# Patient Record
Sex: Female | Born: 1945
Health system: Southern US, Community
[De-identification: ages and names within clinical notes are randomized; demographics above are authoritative.]

## PROBLEM LIST (undated history)

## (undated) DIAGNOSIS — R609 Edema, unspecified: Secondary | ICD-10-CM

## (undated) DIAGNOSIS — Z972 Presence of dental prosthetic device (complete) (partial): Secondary | ICD-10-CM

## (undated) DIAGNOSIS — E119 Type 2 diabetes mellitus without complications: Secondary | ICD-10-CM

## (undated) DIAGNOSIS — C50919 Malignant neoplasm of unspecified site of unspecified female breast: Secondary | ICD-10-CM

## (undated) DIAGNOSIS — K219 Gastro-esophageal reflux disease without esophagitis: Secondary | ICD-10-CM

## (undated) DIAGNOSIS — D649 Anemia, unspecified: Secondary | ICD-10-CM

## (undated) DIAGNOSIS — I10 Essential (primary) hypertension: Secondary | ICD-10-CM

## (undated) HISTORY — PX: COLONOSCOPY: SHX174

## (undated) HISTORY — PX: EYE SURGERY: SHX253

## (undated) HISTORY — DX: Malignant neoplasm of unspecified site of unspecified female breast: C50.919

## (undated) HISTORY — PX: BREAST SURGERY: SHX581

## (undated) HISTORY — PX: OOPHORECTOMY: SHX86

---

## 1978-11-10 HISTORY — PX: ABDOMINAL HYSTERECTOMY: SHX81

## 2012-02-14 ENCOUNTER — Other Ambulatory Visit: Payer: Self-pay | Admitting: Internal Medicine

## 2012-02-14 LAB — CBC WITH DIFFERENTIAL/PLATELET
Basophil %: 4 %
Eosinophil #: 0.3 10*3/uL (ref 0.0–0.7)
Eosinophil %: 5.6 %
HCT: 41.5 % (ref 35.0–47.0)
HGB: 13.5 g/dL (ref 12.0–16.0)
Lymphocyte #: 1.2 10*3/uL (ref 1.0–3.6)
Lymphocyte %: 19.9 %
MCV: 92 fL (ref 80–100)
Monocyte %: 5.4 %
Neutrophil #: 3.8 10*3/uL (ref 1.4–6.5)
Neutrophil %: 65.1 %
Platelet: 354 10*3/uL (ref 150–440)
RDW: 13 % (ref 11.5–14.5)
WBC: 5.8 10*3/uL (ref 3.6–11.0)

## 2012-02-14 LAB — COMPREHENSIVE METABOLIC PANEL
Albumin: 4 g/dL (ref 3.4–5.0)
Alkaline Phosphatase: 84 U/L (ref 50–136)
Anion Gap: 9 (ref 7–16)
Bilirubin,Total: 0.5 mg/dL (ref 0.2–1.0)
Calcium, Total: 9.3 mg/dL (ref 8.5–10.1)
Chloride: 104 mmol/L (ref 98–107)
Co2: 26 mmol/L (ref 21–32)
EGFR (African American): >60
EGFR (Non-African Amer.): 58 — ABNORMAL LOW
Glucose: 112 mg/dL — ABNORMAL HIGH (ref 65–99)
Osmolality: 277 (ref 275–301)
Potassium: 3.9 mmol/L (ref 3.5–5.1)
SGPT (ALT): 18 U/L
Total Protein: 8.7 g/dL — ABNORMAL HIGH (ref 6.4–8.2)

## 2012-02-14 LAB — LIPID PANEL
HDL Cholesterol: 87 mg/dL — ABNORMAL HIGH (ref 40–60)
Ldl Cholesterol, Calc: 118 mg/dL — ABNORMAL HIGH (ref 0–100)
Triglycerides: 64 mg/dL (ref 0–200)
VLDL Cholesterol, Calc: 13 mg/dL (ref 5–40)

## 2012-02-14 LAB — TSH: Thyroid Stimulating Horm: 0.879 u[IU]/mL

## 2012-02-16 ENCOUNTER — Ambulatory Visit: Payer: Self-pay | Admitting: Internal Medicine

## 2012-02-22 ENCOUNTER — Other Ambulatory Visit: Payer: Self-pay | Admitting: Internal Medicine

## 2012-02-22 LAB — COMPREHENSIVE METABOLIC PANEL
Albumin: 3.8 g/dL (ref 3.4–5.0)
Alkaline Phosphatase: 80 U/L (ref 50–136)
Anion Gap: 9 (ref 7–16)
Chloride: 105 mmol/L (ref 98–107)
Creatinine: 1.11 mg/dL (ref 0.60–1.30)
EGFR (African American): 60 — ABNORMAL LOW
EGFR (Non-African Amer.): 51 — ABNORMAL LOW
Glucose: 103 mg/dL — ABNORMAL HIGH (ref 65–99)
Potassium: 4 mmol/L (ref 3.5–5.1)
SGOT(AST): 23 U/L (ref 15–37)
SGPT (ALT): 15 U/L
Sodium: 141 mmol/L (ref 136–145)
Total Protein: 8.3 g/dL — ABNORMAL HIGH (ref 6.4–8.2)

## 2012-02-23 ENCOUNTER — Ambulatory Visit: Payer: Self-pay | Admitting: Internal Medicine

## 2012-02-23 LAB — PROTEIN ELECTROPHORESIS(ARMC)

## 2012-02-24 LAB — UR PROT ELECTROPHORESIS, URINE RANDOM

## 2012-03-01 ENCOUNTER — Ambulatory Visit: Payer: Self-pay | Admitting: Unknown Physician Specialty

## 2012-05-24 DIAGNOSIS — F419 Anxiety disorder, unspecified: Secondary | ICD-10-CM | POA: Insufficient documentation

## 2012-06-09 ENCOUNTER — Ambulatory Visit: Payer: Self-pay | Admitting: Internal Medicine

## 2012-06-30 ENCOUNTER — Ambulatory Visit: Payer: Self-pay | Admitting: Internal Medicine

## 2012-08-02 ENCOUNTER — Ambulatory Visit: Payer: Self-pay | Admitting: Gastroenterology

## 2012-08-02 HISTORY — PX: COLONOSCOPY: SHX174

## 2012-08-02 HISTORY — PX: ESOPHAGOGASTRODUODENOSCOPY: SHX1529

## 2012-08-02 LAB — HM COLONOSCOPY

## 2012-09-14 ENCOUNTER — Ambulatory Visit: Payer: Self-pay | Admitting: Gastroenterology

## 2012-10-26 LAB — HM COLONOSCOPY

## 2013-12-22 ENCOUNTER — Ambulatory Visit: Payer: Self-pay | Admitting: Internal Medicine

## 2014-12-27 DIAGNOSIS — K219 Gastro-esophageal reflux disease without esophagitis: Secondary | ICD-10-CM | POA: Diagnosis not present

## 2014-12-27 DIAGNOSIS — Z23 Encounter for immunization: Secondary | ICD-10-CM | POA: Diagnosis not present

## 2014-12-27 DIAGNOSIS — E1129 Type 2 diabetes mellitus with other diabetic kidney complication: Secondary | ICD-10-CM | POA: Diagnosis not present

## 2014-12-27 DIAGNOSIS — Z0000 Encounter for general adult medical examination without abnormal findings: Secondary | ICD-10-CM | POA: Diagnosis not present

## 2014-12-27 DIAGNOSIS — I10 Essential (primary) hypertension: Secondary | ICD-10-CM | POA: Diagnosis not present

## 2014-12-27 DIAGNOSIS — E782 Mixed hyperlipidemia: Secondary | ICD-10-CM | POA: Diagnosis not present

## 2015-02-22 ENCOUNTER — Ambulatory Visit
Admit: 2015-02-22 | Disposition: A | Discharge status: Home / Self Care | Payer: Self-pay | Attending: Internal Medicine | Admitting: Internal Medicine

## 2015-02-22 DIAGNOSIS — R928 Other abnormal and inconclusive findings on diagnostic imaging of breast: Secondary | ICD-10-CM | POA: Diagnosis not present

## 2015-02-22 DIAGNOSIS — Z1231 Encounter for screening mammogram for malignant neoplasm of breast: Secondary | ICD-10-CM | POA: Diagnosis not present

## 2015-03-01 ENCOUNTER — Ambulatory Visit
Admit: 2015-03-01 | Disposition: A | Discharge status: Home / Self Care | Payer: Self-pay | Attending: Internal Medicine | Admitting: Internal Medicine

## 2015-03-01 DIAGNOSIS — N63 Unspecified lump in breast: Secondary | ICD-10-CM | POA: Diagnosis not present

## 2015-03-01 DIAGNOSIS — R922 Inconclusive mammogram: Secondary | ICD-10-CM | POA: Diagnosis not present

## 2015-07-23 ENCOUNTER — Encounter: Payer: Self-pay | Admitting: Internal Medicine

## 2015-07-24 ENCOUNTER — Other Ambulatory Visit: Payer: Self-pay | Admitting: Internal Medicine

## 2015-07-24 DIAGNOSIS — E1122 Type 2 diabetes mellitus with diabetic chronic kidney disease: Secondary | ICD-10-CM | POA: Insufficient documentation

## 2015-07-24 DIAGNOSIS — E1169 Type 2 diabetes mellitus with other specified complication: Secondary | ICD-10-CM | POA: Insufficient documentation

## 2015-07-24 DIAGNOSIS — E785 Hyperlipidemia, unspecified: Secondary | ICD-10-CM

## 2015-07-24 DIAGNOSIS — N6019 Diffuse cystic mastopathy of unspecified breast: Secondary | ICD-10-CM

## 2015-07-24 DIAGNOSIS — I10 Essential (primary) hypertension: Secondary | ICD-10-CM | POA: Insufficient documentation

## 2015-07-24 DIAGNOSIS — N183 Chronic kidney disease, stage 3 (moderate): Secondary | ICD-10-CM

## 2015-07-24 DIAGNOSIS — K219 Gastro-esophageal reflux disease without esophagitis: Secondary | ICD-10-CM | POA: Insufficient documentation

## 2015-07-24 DIAGNOSIS — R7303 Prediabetes: Secondary | ICD-10-CM | POA: Insufficient documentation

## 2015-07-24 DIAGNOSIS — R634 Abnormal weight loss: Secondary | ICD-10-CM

## 2015-07-24 DIAGNOSIS — E782 Mixed hyperlipidemia: Secondary | ICD-10-CM | POA: Insufficient documentation

## 2015-07-24 HISTORY — DX: Abnormal weight loss: R63.4

## 2015-07-25 ENCOUNTER — Encounter: Payer: Self-pay | Admitting: Internal Medicine

## 2015-07-25 ENCOUNTER — Ambulatory Visit (INDEPENDENT_AMBULATORY_CARE_PROVIDER_SITE_OTHER): Payer: Medicare Other | Admitting: Internal Medicine

## 2015-07-25 VITALS — BP 138/80 | HR 76 | Ht 68.0 in | Wt 141.0 lb

## 2015-07-25 DIAGNOSIS — E119 Type 2 diabetes mellitus without complications: Secondary | ICD-10-CM

## 2015-07-25 DIAGNOSIS — N6012 Diffuse cystic mastopathy of left breast: Secondary | ICD-10-CM | POA: Diagnosis not present

## 2015-07-25 DIAGNOSIS — I10 Essential (primary) hypertension: Secondary | ICD-10-CM | POA: Diagnosis not present

## 2015-07-25 DIAGNOSIS — H6982 Other specified disorders of Eustachian tube, left ear: Secondary | ICD-10-CM

## 2015-07-25 MED ORDER — FLUTICASONE PROPIONATE 50 MCG/ACT NA SUSP: 2.0000 | Freq: Every day | NASAL | Status: DC

## 2015-07-25 NOTE — Progress Notes (Signed)
Date:  07/25/2015   Name:  Brandy Diaz   DOB:  August 05, 1946   MRN:  [id number]   Chief Complaint: Tinnitus Ear Fullness  There is pain in the left ear. This is a new problem. The current episode started 1 to 4 weeks ago. The problem occurs constantly. There has been no fever. Pertinent negatives include no coughing, ear discharge, headaches, hearing loss, neck pain or sore throat. Associated symptoms comments: Tinnitus - worse at night.  Diabetes She presents for her follow-up diabetic visit. She has type 2 diabetes mellitus. Her disease course has been stable. There are no hypoglycemic associated symptoms. Pertinent negatives for hypoglycemia include no dizziness or headaches. Pertinent negatives for diabetes include no chest pain and no fatigue. Symptoms are stable.  Hypertension This is a chronic problem. The current episode started more than 1 year ago. The problem is unchanged. The problem is controlled. Pertinent negatives include no chest pain, headaches, neck pain, palpitations or shortness of breath. Past treatments include beta blockers. There are no compliance problems.   No dizziness or headache.  Abnormal mammogram Patient had abnormal mammogram in April. Was a small density noted on the left. They wanted to refer her for biopsy but she declined. She is wondering if she should follow-up next month for repeat mammogram and ultrasound. She denies any breast mass or tenderness. Review of Systems:  Review of Systems  Constitutional: Negative for fever and fatigue.  HENT: Positive for facial swelling, sinus pressure and tinnitus. Negative for dental problem, ear discharge, ear pain, hearing loss, postnasal drip and sore throat.   Respiratory: Negative for cough, shortness of breath and stridor.   Cardiovascular: Negative for chest pain, palpitations and leg swelling.  Genitourinary: Negative.        No breast masses or tenderness and no nipple discharge  Musculoskeletal: Negative for  neck pain.  Neurological: Negative for dizziness, light-headedness, numbness and headaches.  Hematological: Negative for adenopathy.    Patient Active Problem List   Diagnosis Date Noted  . Essential (primary) hypertension 07/24/2015  . Gastro-esophageal reflux disease without esophagitis 07/24/2015  . Combined fat and carbohydrate induced hyperlipemia 07/24/2015  . Diabetes mellitus, type 2 07/24/2015  . Unexplained weight loss 07/24/2015  . Fibrocystic breast changes 07/24/2015    Prior to Admission medications   Medication Sig Start Date End Date Taking? Authorizing Provider  aspirin EC 81 MG tablet Take by mouth.   Yes Historical Provider, MD  atorvastatin (LIPITOR) 10 MG tablet Take 1 tablet by mouth daily. 12/27/14  Yes Historical Provider, MD  Glucose Blood (BAYER BREEZE 2 TEST) DISK BAYER BREEZE 2 TEST (In Vitro Disk)  1 (one) Each Each daily for 50 days  Quantity: 5;  Refills: 5   Ordered :27-Dec-2014  Alison Stalling ;  Started 26-Jun-2014 Active Comments: 250.02 06/26/14  Yes Historical Provider, MD  metoprolol succinate (TOPROL-XL) 25 MG 24 hr tablet Take 1 tablet by mouth 2 (two) times daily. 12/27/14  Yes Historical Provider, MD  omeprazole (PRILOSEC) 20 MG capsule Take 1 capsule by mouth daily. 12/27/14  Yes Historical Provider, MD    Allergies  Allergen Reactions  . Ace Inhibitors     Other reaction(s): Cough  . Hydrochlorothiazide     Other reaction(s): Dizziness    Past Surgical History  Procedure Laterality Date  . Abdominal hysterectomy      Social History  Substance Use Topics  . Smoking status: Never Smoker   . Smokeless tobacco: None  . Alcohol Use: No  Medication list has been reviewed and updated.  Physical Examination:  Physical Exam  Constitutional: She is oriented to person, place, and time. She appears well-developed and well-nourished. No distress.  HENT:  Head: Normocephalic and atraumatic.  Right Ear: Tympanic membrane is  retracted. Tympanic membrane is not injected, not scarred, not perforated and not erythematous.  Left Ear: Tympanic membrane is scarred and retracted. Tympanic membrane is not perforated and not erythematous.  Nose: Right sinus exhibits maxillary sinus tenderness. Left sinus exhibits maxillary sinus tenderness.  Eyes: Conjunctivae are normal. Right eye exhibits no discharge. Left eye exhibits no discharge. No scleral icterus.  Cardiovascular: Normal rate, regular rhythm and normal heart sounds.   Pulmonary/Chest: Effort normal and breath sounds normal. No respiratory distress.  Musculoskeletal: Normal range of motion.  Neurological: She is alert and oriented to person, place, and time.  Skin: Skin is warm and dry. No rash noted.  Psychiatric: She has a normal mood and affect. Her behavior is normal. Thought content normal.    BP 138/80 mmHg  Pulse 76  Ht 5\' 8"  (1.727 m)  Wt 141 lb (63.957 kg)  BMI 21.44 kg/m2  Assessment and Plan: 1. Fibrocystic breast changes, left Patient encouraged to proceed with follow-up mammogram and ultrasound  2. Eustachian tube dysfunction, left Begin Flonase spray for eustachian tube dysfunction Follow-up or call for ENT referral if symptoms persist - fluticasone (FLONASE) 50 MCG/ACT nasal spray; Place 2 sprays into both nostrils daily.  Dispense: 16 g; Refill: 6  3. Essential (primary) hypertension Well-controlled on current medication  4. Type 2 diabetes mellitus without complication Doing well on diet alone   Halina Maidens, MD [facility]  07/25/2015

## 2015-08-31 ENCOUNTER — Telehealth: Payer: Self-pay

## 2015-08-31 ENCOUNTER — Other Ambulatory Visit: Payer: Self-pay | Admitting: Internal Medicine

## 2015-08-31 DIAGNOSIS — R928 Other abnormal and inconclusive findings on diagnostic imaging of breast: Secondary | ICD-10-CM

## 2015-08-31 NOTE — Telephone Encounter (Signed)
Patient needs referral for six month follow up on mammogram and possible ultrasound.dr

## 2015-09-14 ENCOUNTER — Other Ambulatory Visit: Payer: Self-pay | Admitting: Internal Medicine

## 2015-09-14 ENCOUNTER — Ambulatory Visit
Admission: RE | Admit: 2015-09-14 | Discharge: 2015-09-14 | Disposition: A | Discharge status: Home / Self Care | Payer: Medicare Other | Source: Ambulatory Visit | Attending: Internal Medicine | Admitting: Internal Medicine

## 2015-09-14 DIAGNOSIS — R928 Other abnormal and inconclusive findings on diagnostic imaging of breast: Secondary | ICD-10-CM

## 2015-09-14 DIAGNOSIS — N63 Unspecified lump in breast: Secondary | ICD-10-CM | POA: Insufficient documentation

## 2015-11-22 ENCOUNTER — Other Ambulatory Visit: Payer: Self-pay | Admitting: Internal Medicine

## 2015-11-22 MED ORDER — GLUCOSE BLOOD VI DISK: DISK | Status: DC

## 2015-12-31 ENCOUNTER — Ambulatory Visit (INDEPENDENT_AMBULATORY_CARE_PROVIDER_SITE_OTHER): Payer: Medicare Other | Admitting: Internal Medicine

## 2015-12-31 ENCOUNTER — Encounter: Payer: Self-pay | Admitting: Internal Medicine

## 2015-12-31 VITALS — BP 150/84 | HR 84 | Ht 68.0 in | Wt 138.4 lb

## 2015-12-31 DIAGNOSIS — E119 Type 2 diabetes mellitus without complications: Secondary | ICD-10-CM

## 2015-12-31 DIAGNOSIS — E782 Mixed hyperlipidemia: Secondary | ICD-10-CM

## 2015-12-31 DIAGNOSIS — I10 Essential (primary) hypertension: Secondary | ICD-10-CM | POA: Diagnosis not present

## 2015-12-31 DIAGNOSIS — Z1231 Encounter for screening mammogram for malignant neoplasm of breast: Secondary | ICD-10-CM | POA: Diagnosis not present

## 2015-12-31 DIAGNOSIS — I499 Cardiac arrhythmia, unspecified: Secondary | ICD-10-CM | POA: Diagnosis not present

## 2015-12-31 MED ORDER — ATORVASTATIN CALCIUM 10 MG PO TABS: 10.0000 mg | ORAL_TABLET | Freq: Every day | ORAL | Status: DC

## 2015-12-31 MED ORDER — METOPROLOL SUCCINATE ER 25 MG PO TB24: 25.0000 mg | ORAL_TABLET | Freq: Two times a day (BID) | ORAL | Status: DC

## 2015-12-31 NOTE — Patient Instructions (Addendum)
Health Maintenance  Topic Date Due  . OPHTHALMOLOGY EXAM  12/10/1954  . DEXA SCAN  12/30/2020 (Originally 12/10/2009)  . ZOSTAVAX  12/30/2020 (Originally 12/10/2004)  . Hepatitis C Screening  12/30/2020 (Originally 1946-02-20)  . INFLUENZA VACCINE  06/10/2016  . HEMOGLOBIN A1C  06/29/2016  . FOOT EXAM  12/30/2016  . URINE MICROALBUMIN  12/30/2016  . MAMMOGRAM  02/22/2017  . COLONOSCOPY  08/02/2022  . TETANUS/TDAP  05/12/2023  . PNA vac Low Risk Adult  Completed

## 2015-12-31 NOTE — Progress Notes (Signed)
Patient: Brandy Diaz, Female    DOB: 1946/01/01, 70 y.o.   MRN: [id number] Visit Date: 12/31/2015  Today's Provider: Halina Maidens, MD   Chief Complaint  Patient presents with  . Medicare Wellness   Subjective:    Annual wellness visit Brandy Diaz is a 70 y.o. female who presents today for her Subsequent Annual Wellness Visit. She feels fairly well. She reports exercising walking. She reports she is sleeping fairly well.   ----------------------------------------------------------- Hypertension Associated symptoms include palpitations. Pertinent negatives include no chest pain, headaches or shortness of breath. Past treatments include beta blockers. The current treatment provides significant improvement.  Hyperlipidemia Pertinent negatives include no chest pain or shortness of breath. Current antihyperlipidemic treatment includes statins. The current treatment provides significant improvement of lipids. There are no compliance problems.   Diabetes She presents for her follow-up diabetic visit. She has type 2 diabetes mellitus. Pertinent negatives for hypoglycemia include no dizziness, headaches, nervousness/anxiousness or tremors. Pertinent negatives for diabetes include no chest pain, no fatigue, no polydipsia and no polyuria. Symptoms are stable. Current diabetic treatment includes diet. She monitors blood glucose at home 1-2 x per day. Her breakfast blood glucose is taken between 7-8 am. Her breakfast blood glucose range is generally 110-130 mg/dl.    Review of Systems  Constitutional: Negative for fever, chills and fatigue.  HENT: Negative for congestion, hearing loss, tinnitus, trouble swallowing and voice change.   Eyes: Negative for visual disturbance.  Respiratory: Negative for cough, chest tightness, shortness of breath and wheezing.   Cardiovascular: Positive for palpitations and leg swelling. Negative for chest pain.  Gastrointestinal: Negative for vomiting,  abdominal pain, diarrhea and constipation.  Endocrine: Negative for polydipsia and polyuria.  Genitourinary: Negative for dysuria, frequency, vaginal bleeding, vaginal discharge and genital sores.  Musculoskeletal: Negative for joint swelling, arthralgias and gait problem.  Skin: Negative for color change and rash.  Neurological: Negative for dizziness, tremors, light-headedness and headaches.  Hematological: Negative for adenopathy. Does not bruise/bleed easily.  Psychiatric/Behavioral: Negative for sleep disturbance and dysphoric mood. The patient is not nervous/anxious.     Social History   Social History  . Marital Status: Married    Spouse Name: N/A  . Number of Children: N/A  . Years of Education: N/A   Occupational History  . Not on file.   Social History Main Topics  . Smoking status: Never Smoker   . Smokeless tobacco: Not on file  . Alcohol Use: No  . Drug Use: No  . Sexual Activity: Not on file   Other Topics Concern  . Not on file   Social History Narrative    Patient Active Problem List   Diagnosis Date Noted  . Essential (primary) hypertension 07/24/2015  . Gastro-esophageal reflux disease without esophagitis 07/24/2015  . Combined fat and carbohydrate induced hyperlipemia 07/24/2015  . Diabetes mellitus, type 2 ([place]) 07/24/2015  . Unexplained weight loss 07/24/2015  . Fibrocystic breast changes 07/24/2015    Past Surgical History  Procedure Laterality Date  . Abdominal hysterectomy      Her family history includes Breast cancer in her mother and sister; CAD in her father; Diabetes in her mother.    Previous Medications   ASPIRIN EC 81 MG TABLET    Take by mouth.   ATORVASTATIN (LIPITOR) 10 MG TABLET    Take 1 tablet by mouth daily.   FLUTICASONE (FLONASE) 50 MCG/ACT NASAL SPRAY    Place 2 sprays into both nostrils daily.   GLUCOSE BLOOD (  BAYER BREEZE 2 TEST) DISK    BAYER BREEZE 2 TEST (In Vitro Disk)  1 (one) Each Each daily for 50 days   Quantity: 5;  Refills: 5   Ordered :27-Dec-2014  Alison Stalling ;  Started 26-Jun-2014 Active Comments: 250.02   METOPROLOL SUCCINATE (TOPROL-XL) 25 MG 24 HR TABLET    Take 1 tablet by mouth 2 (two) times daily.    Patient Care Team: Glean Hess, MD as PCP - General (Family Medicine)     Objective:   Vitals: BP 144/78 mmHg  Pulse 84  Ht 5\' 8"  (1.727 m)  Wt 138 lb 6.4 oz (62.778 kg)  BMI 21.05 kg/m2  Physical Exam  Constitutional: She is oriented to person, place, and time. She appears well-developed and well-nourished. No distress.  HENT:  Head: Normocephalic and atraumatic.  Right Ear: Tympanic membrane and ear canal normal.  Left Ear: Tympanic membrane and ear canal normal.  Nose: Right sinus exhibits no maxillary sinus tenderness. Left sinus exhibits no maxillary sinus tenderness.  Mouth/Throat: Uvula is midline and oropharynx is clear and moist.  Eyes: Conjunctivae and EOM are normal. Right eye exhibits no discharge. Left eye exhibits no discharge. No scleral icterus.  Neck: Normal range of motion. Carotid bruit is not present. No erythema present. No thyromegaly present.  Cardiovascular: Normal rate, regular rhythm, normal heart sounds and normal pulses.  Frequent extrasystoles are present.  Pulses:      Dorsalis pedis pulses are 2+ on the right side, and 2+ on the left side.       Posterior tibial pulses are 2+ on the right side, and 2+ on the left side.  Pulmonary/Chest: Effort normal. No respiratory distress. She has no wheezes. Right breast exhibits no mass, no nipple discharge, no skin change and no tenderness. Left breast exhibits no mass, no nipple discharge, no skin change and no tenderness.  Abdominal: Soft. Bowel sounds are normal. There is no hepatosplenomegaly. There is no tenderness. There is no CVA tenderness.  Musculoskeletal: Normal range of motion. She exhibits no edema or tenderness.  Lymphadenopathy:    She has no cervical adenopathy.    She has no  axillary adenopathy.  Neurological: She is alert and oriented to person, place, and time. She has normal reflexes. No cranial nerve deficit or sensory deficit.  Skin: Skin is warm, dry and intact. No rash noted.  Psychiatric: She has a normal mood and affect. Her speech is normal and behavior is normal. Thought content normal.  Nursing note and vitals reviewed.   Activities of Daily Living In your present state of health, do you have any difficulty performing the following activities: 12/31/2015 07/25/2015  Hearing? N Y  Vision? N Y  Difficulty concentrating or making decisions? N N  Walking or climbing stairs? N N  Dressing or bathing? N N  Doing errands, shopping? N N    Fall Risk Assessment Fall Risk  12/31/2015 07/25/2015  Falls in the past year? No No      Depression Screen PHQ 2/9 Scores 12/31/2015 07/25/2015  PHQ - 2 Score 0 2  PHQ- 9 Score - 5    Cognitive Testing - 6-CIT   Correct? Score   What year is it? yes 0 Yes = 0    No = 4  What month is it? yes 0 Yes = 0    No = 3  Remember:     Pia [address]au, [place], [place]     What time is  it? yes 0 Yes = 0    No = 3  Count backwards from 20 to 1 yes 1 Correct = 0    1 error = 2   More than 1 error = 4  Say the months of the year in reverse. yes 0 Correct = 0    1 error = 2   More than 1 error = 4  What address did I ask you to remember? no 6 Correct = 0  1 error = 2    2 error = 4    3 error = 6    4 error = 8    All wrong = 10       TOTAL SCORE  7/28   Interpretation:  Normal  Normal (0-7) Abnormal (8-28)        Medicare Annual Wellness Visit Summary:  Reviewed patient's Family Medical History Reviewed and updated list of patient's medical providers Assessment of cognitive impairment was done Assessed patient's functional ability Established a written schedule for health screening Daisy Completed and Reviewed  Exercise Activities and Dietary recommendations Goals    None       Immunization History  Administered Date(s) Administered  . Influenza-Unspecified 09/26/2015  . Pneumococcal Conjugate-13 12/27/2014  . Pneumococcal Polysaccharide-23 11/11/2012  . Tdap 05/11/2013    Health Maintenance  Topic Date Due  . Hepatitis C Screening  05/10/1946  . FOOT EXAM  12/10/1954  . OPHTHALMOLOGY EXAM  12/10/1954  . URINE MICROALBUMIN  12/10/1954  . COLONOSCOPY  12/10/1994  . ZOSTAVAX  12/10/2004  . DEXA SCAN  12/10/2009  . HEMOGLOBIN A1C  08/23/2012  . INFLUENZA VACCINE  06/10/2016  . MAMMOGRAM  02/22/2017  . TETANUS/TDAP  05/12/2023  . PNA vac Low Risk Adult  Completed     Discussed health benefits of physical activity, and encouraged her to engage in regular exercise appropriate for her age and condition.    ------------------------------------------------------------------------------------------------------------   Assessment & Plan:  1. Cardiac arrhythmia, unspecified cardiac arrhythmia type Intermittent palpitations without symptoms - EKG 12-Lead - WNL  2. Essential (primary) hypertension Fair control - per patient controlled at home - metoprolol succinate (TOPROL-XL) 25 MG 24 hr tablet; Take 1 tablet (25 mg total) by mouth 2 (two) times daily.  Dispense: 180 tablet; Refill: 3 - CBC with Differential/Platelet - TSH  3. Type 2 diabetes mellitus without complication, without long-term current use of insulin (HCC) Continue dietary control Patient encouraged to schedule eye exam - Microalbumin / creatinine urine ratio - Comprehensive metabolic panel - Hemoglobin A1c  4. Combined fat and carbohydrate induced hyperlipemia On statin therapy - atorvastatin (LIPITOR) 10 MG tablet; Take 1 tablet (10 mg total) by mouth daily.  Dispense: 90 tablet; Refill: 3 - Lipid panel  5. Encounter for screening mammogram for breast cancer - MM DIGITAL SCREENING BILATERAL; Future   Halina Maidens, MD [facility]  12/31/2015

## 2016-01-01 ENCOUNTER — Encounter: Payer: Self-pay | Admitting: Internal Medicine

## 2016-01-01 DIAGNOSIS — N183 Chronic kidney disease, stage 3 (moderate): Secondary | ICD-10-CM

## 2016-01-01 DIAGNOSIS — N1831 Chronic kidney disease, stage 3a: Secondary | ICD-10-CM | POA: Insufficient documentation

## 2016-01-01 LAB — MICROALBUMIN / CREATININE URINE RATIO
Creatinine, Urine: 116.8 mg/dL
MICROALB/CREAT RATIO: 16.6 mg/g{creat} (ref 0.0–30.0)
Microalbumin, Urine: 19.4 ug/mL

## 2016-01-01 LAB — CBC WITH DIFFERENTIAL/PLATELET
BASOS: 1 %
Basophils Absolute: 0.1 10*3/uL (ref 0.0–0.2)
EOS (ABSOLUTE): 0.3 10*3/uL (ref 0.0–0.4)
Eos: 5 %
HEMATOCRIT: 39.6 % (ref 34.0–46.6)
Hemoglobin: 13 g/dL (ref 11.1–15.9)
IMMATURE GRANS (ABS): 0 10*3/uL (ref 0.0–0.1)
IMMATURE GRANULOCYTES: 0 %
LYMPHS ABS: 1 10*3/uL (ref 0.7–3.1)
LYMPHS: 18 %
MCH: 29.3 pg (ref 26.6–33.0)
MCHC: 32.8 g/dL (ref 31.5–35.7)
MCV: 89 fL (ref 79–97)
Monocytes Absolute: 0.4 10*3/uL (ref 0.1–0.9)
Monocytes: 7 %
NEUTROS ABS: 3.8 10*3/uL (ref 1.4–7.0)
Neutrophils: 69 %
Platelets: 332 10*3/uL (ref 150–379)
RBC: 4.44 x10E6/uL (ref 3.77–5.28)
RDW: 13.8 % (ref 12.3–15.4)
WBC: 5.6 10*3/uL (ref 3.4–10.8)

## 2016-01-01 LAB — LIPID PANEL
CHOLESTEROL TOTAL: 174 mg/dL (ref 100–199)
Chol/HDL Ratio: 2.4 ratio units (ref 0.0–4.4)
HDL: 74 mg/dL (ref >39)
LDL CALC: 86 mg/dL (ref 0–99)
Triglycerides: 69 mg/dL (ref 0–149)
VLDL Cholesterol Cal: 14 mg/dL (ref 5–40)

## 2016-01-01 LAB — COMPREHENSIVE METABOLIC PANEL
A/G RATIO: 1.2 (ref 1.1–2.5)
ALT: 9 IU/L (ref 0–32)
AST: 18 IU/L (ref 0–40)
Albumin: 4.1 g/dL (ref 3.5–4.8)
Alkaline Phosphatase: 88 IU/L (ref 39–117)
BUN/Creatinine Ratio: 9 — ABNORMAL LOW (ref 11–26)
BUN: 11 mg/dL (ref 8–27)
Bilirubin Total: 0.3 mg/dL (ref 0.0–1.2)
CALCIUM: 9.5 mg/dL (ref 8.7–10.3)
CO2: 26 mmol/L (ref 18–29)
CREATININE: 1.2 mg/dL — AB (ref 0.57–1.00)
Chloride: 99 mmol/L (ref 96–106)
GFR calc Af Amer: 53 mL/min/{1.73_m2} — ABNORMAL LOW (ref >59)
GFR, EST NON AFRICAN AMERICAN: 46 mL/min/{1.73_m2} — AB (ref >59)
GLOBULIN, TOTAL: 3.5 g/dL (ref 1.5–4.5)
Glucose: 102 mg/dL — ABNORMAL HIGH (ref 65–99)
Potassium: 4.2 mmol/L (ref 3.5–5.2)
SODIUM: 140 mmol/L (ref 134–144)
TOTAL PROTEIN: 7.6 g/dL (ref 6.0–8.5)

## 2016-01-01 LAB — TSH: TSH: 1.26 u[IU]/mL (ref 0.450–4.500)

## 2016-01-01 LAB — PLEASE NOTE

## 2016-01-01 LAB — HEMOGLOBIN A1C
ESTIMATED AVERAGE GLUCOSE: 126 mg/dL
Hgb A1c MFr Bld: 6 % — ABNORMAL HIGH (ref 4.8–5.6)

## 2016-01-02 ENCOUNTER — Other Ambulatory Visit: Payer: Self-pay | Admitting: Internal Medicine

## 2016-01-02 ENCOUNTER — Telehealth: Payer: Self-pay

## 2016-01-02 MED ORDER — OMEPRAZOLE 20 MG PO CPDR: 20.0000 mg | DELAYED_RELEASE_CAPSULE | Freq: Every day | ORAL | Status: DC

## 2016-01-02 NOTE — Telephone Encounter (Signed)
-----   Message from Glean Hess, MD sent at 01/01/2016  1:22 PM EST ----- DM is excellent. Kidney function is very slightly decreased.  All other labs are normal.

## 2016-01-02 NOTE — Telephone Encounter (Signed)
Spoke with patient. Patient advised of all results and verbalized understanding. Will call back with any future questions or concerns. MAH  

## 2016-01-29 ENCOUNTER — Other Ambulatory Visit: Payer: Self-pay

## 2016-02-05 ENCOUNTER — Other Ambulatory Visit: Payer: Self-pay | Admitting: Internal Medicine

## 2016-02-05 ENCOUNTER — Telehealth: Payer: Self-pay

## 2016-02-05 DIAGNOSIS — R928 Other abnormal and inconclusive findings on diagnostic imaging of breast: Secondary | ICD-10-CM

## 2016-02-05 NOTE — Telephone Encounter (Signed)
Patient states that she was told it is time for her mammogram appt. Patient states that has had abnormal recently and the breast center is requiring an order from her PCP

## 2016-03-03 ENCOUNTER — Ambulatory Visit
Admission: RE | Admit: 2016-03-03 | Discharge: 2016-03-03 | Disposition: A | Discharge status: Home / Self Care | Payer: Medicare Other | Source: Ambulatory Visit | Attending: Internal Medicine | Admitting: Internal Medicine

## 2016-03-03 DIAGNOSIS — N63 Unspecified lump in breast: Secondary | ICD-10-CM | POA: Insufficient documentation

## 2016-03-03 DIAGNOSIS — R928 Other abnormal and inconclusive findings on diagnostic imaging of breast: Secondary | ICD-10-CM | POA: Diagnosis not present

## 2016-04-01 DIAGNOSIS — H2513 Age-related nuclear cataract, bilateral: Secondary | ICD-10-CM | POA: Diagnosis not present

## 2016-04-02 ENCOUNTER — Other Ambulatory Visit: Payer: Self-pay

## 2016-06-06 DIAGNOSIS — H2513 Age-related nuclear cataract, bilateral: Secondary | ICD-10-CM | POA: Diagnosis not present

## 2016-06-09 ENCOUNTER — Encounter: Payer: Self-pay | Admitting: *Deleted

## 2016-06-12 ENCOUNTER — Ambulatory Visit: Payer: Medicare Other | Admitting: Certified Registered Nurse Anesthetist

## 2016-06-12 ENCOUNTER — Encounter: Payer: Self-pay | Admitting: *Deleted

## 2016-06-12 ENCOUNTER — Ambulatory Visit
Admission: RE | Admit: 2016-06-12 | Discharge: 2016-06-12 | Disposition: A | Discharge status: Home / Self Care | Payer: Medicare Other | Source: Ambulatory Visit | Attending: Ophthalmology | Admitting: Ophthalmology

## 2016-06-12 ENCOUNTER — Encounter
Admission: RE | Disposition: A | Discharge status: Home / Self Care | Payer: Self-pay | Source: Ambulatory Visit | Attending: Ophthalmology

## 2016-06-12 DIAGNOSIS — K219 Gastro-esophageal reflux disease without esophagitis: Secondary | ICD-10-CM | POA: Insufficient documentation

## 2016-06-12 DIAGNOSIS — E1136 Type 2 diabetes mellitus with diabetic cataract: Secondary | ICD-10-CM | POA: Insufficient documentation

## 2016-06-12 DIAGNOSIS — Z90710 Acquired absence of both cervix and uterus: Secondary | ICD-10-CM | POA: Insufficient documentation

## 2016-06-12 DIAGNOSIS — E119 Type 2 diabetes mellitus without complications: Secondary | ICD-10-CM | POA: Diagnosis not present

## 2016-06-12 DIAGNOSIS — H2513 Age-related nuclear cataract, bilateral: Secondary | ICD-10-CM | POA: Diagnosis not present

## 2016-06-12 DIAGNOSIS — H2511 Age-related nuclear cataract, right eye: Secondary | ICD-10-CM | POA: Diagnosis not present

## 2016-06-12 DIAGNOSIS — I10 Essential (primary) hypertension: Secondary | ICD-10-CM | POA: Diagnosis not present

## 2016-06-12 DIAGNOSIS — E7800 Pure hypercholesterolemia, unspecified: Secondary | ICD-10-CM | POA: Diagnosis not present

## 2016-06-12 HISTORY — PX: CATARACT EXTRACTION W/PHACO: SHX586

## 2016-06-12 HISTORY — DX: Gastro-esophageal reflux disease without esophagitis: K21.9

## 2016-06-12 HISTORY — DX: Type 2 diabetes mellitus without complications: E11.9

## 2016-06-12 HISTORY — DX: Essential (primary) hypertension: I10

## 2016-06-12 HISTORY — DX: Edema, unspecified: R60.9

## 2016-06-12 LAB — GLUCOSE, CAPILLARY: Glucose-Capillary: 113 mg/dL — ABNORMAL HIGH (ref 65–99)

## 2016-06-12 SURGERY — PHACOEMULSIFICATION, CATARACT, WITH IOL INSERTION
Anesthesia: Monitor Anesthesia Care | Site: Eye | Laterality: Right | Wound class: Clean

## 2016-06-12 MED ORDER — LIDOCAINE HCL (PF) 4 % IJ SOLN
INTRAMUSCULAR | Status: AC
  Filled 2016-06-12: qty 5

## 2016-06-12 MED ORDER — LIDOCAINE HCL (PF) 4 % IJ SOLN
INTRAOCULAR | Status: DC | PRN
  Administered 2016-06-12: 09:00:00 via OPHTHALMIC

## 2016-06-12 MED ORDER — POVIDONE-IODINE 5 % OP SOLN
OPHTHALMIC | Status: AC
  Filled 2016-06-12: qty 30

## 2016-06-12 MED ORDER — TRYPAN BLUE 0.06 % OP SOLN
OPHTHALMIC | Status: AC
  Filled 2016-06-12: qty 0.5

## 2016-06-12 MED ORDER — SODIUM HYALURONATE 23 MG/ML IO SOLN
INTRAOCULAR | Status: DC | PRN
  Administered 2016-06-12: 0.6 mL via INTRAOCULAR

## 2016-06-12 MED ORDER — EPINEPHRINE HCL 1 MG/ML IJ SOLN
INTRAMUSCULAR | Status: AC
  Filled 2016-06-12: qty 1

## 2016-06-12 MED ORDER — MIDAZOLAM HCL 2 MG/2ML IJ SOLN
INTRAMUSCULAR | Status: DC | PRN
  Administered 2016-06-12 (×2): 0.5 mg via INTRAVENOUS

## 2016-06-12 MED ORDER — TETRACAINE HCL 0.5 % OP SOLN
1.0000 [drp] | Freq: Once | OPHTHALMIC | Status: AC
  Administered 2016-06-12: 1 [drp] via OPHTHALMIC

## 2016-06-12 MED ORDER — CEFUROXIME OPHTHALMIC INJECTION 1 MG/0.1 ML
INJECTION | OPHTHALMIC | Status: AC
  Filled 2016-06-12: qty 0.1

## 2016-06-12 MED ORDER — BSS IO SOLN
INTRAOCULAR | Status: DC | PRN
  Administered 2016-06-12: 15 mL via INTRAOCULAR

## 2016-06-12 MED ORDER — NEOMYCIN-POLYMYXIN-DEXAMETH 3.5-10000-0.1 OP OINT
TOPICAL_OINTMENT | OPHTHALMIC | Status: AC
  Filled 2016-06-12: qty 3.5

## 2016-06-12 MED ORDER — MOXIFLOXACIN HCL 0.5 % OP SOLN: 1.0000 [drp] | OPHTHALMIC | Status: DC | PRN

## 2016-06-12 MED ORDER — POVIDONE-IODINE 5 % OP SOLN
1.0000 "application " | Freq: Once | OPHTHALMIC | Status: AC
  Administered 2016-06-12: 1 via OPHTHALMIC

## 2016-06-12 MED ORDER — NA HYALUR & NA CHOND-NA HYALUR 0.55-0.5 ML IO KIT
PACK | INTRAOCULAR | Status: AC
  Filled 2016-06-12: qty 1.05

## 2016-06-12 MED ORDER — CEFUROXIME OPHTHALMIC INJECTION 1 MG/0.1 ML
INJECTION | OPHTHALMIC | Status: DC | PRN
  Administered 2016-06-12: 0.1 mL via INTRACAMERAL

## 2016-06-12 MED ORDER — FENTANYL CITRATE (PF) 100 MCG/2ML IJ SOLN
INTRAMUSCULAR | Status: DC | PRN
  Administered 2016-06-12 (×2): 25 ug via INTRAVENOUS

## 2016-06-12 MED ORDER — TETRACAINE HCL 0.5 % OP SOLN
OPHTHALMIC | Status: AC
  Filled 2016-06-12: qty 2

## 2016-06-12 MED ORDER — MOXIFLOXACIN HCL 0.5 % OP SOLN
OPHTHALMIC | Status: AC
  Filled 2016-06-12: qty 3

## 2016-06-12 MED ORDER — SODIUM CHLORIDE 0.9 % IV SOLN
INTRAVENOUS | Status: DC
  Administered 2016-06-12: 08:00:00 via INTRAVENOUS

## 2016-06-12 MED ORDER — EPINEPHRINE HCL 1 MG/ML IJ SOLN
INTRAOCULAR | Status: DC | PRN
  Administered 2016-06-12 (×2): 1 mL via OPHTHALMIC

## 2016-06-12 MED ORDER — ARMC OPHTHALMIC DILATING GEL
1.0000 "application " | OPHTHALMIC | Status: AC | PRN
  Administered 2016-06-12 (×2): 1 via OPHTHALMIC

## 2016-06-12 MED ORDER — SODIUM HYALURONATE 10 MG/ML IO SOLN
INTRAOCULAR | Status: DC | PRN
  Administered 2016-06-12: 0.85 mL via INTRAOCULAR

## 2016-06-12 MED ORDER — MOXIFLOXACIN HCL 0.5 % OP SOLN
OPHTHALMIC | Status: DC | PRN
  Administered 2016-06-12: 1 [drp] via OPHTHALMIC

## 2016-06-12 MED ORDER — ARMC OPHTHALMIC DILATING GEL
OPHTHALMIC | Status: AC
  Filled 2016-06-12: qty 0.25

## 2016-06-12 SURGICAL SUPPLY — 24 items
CANNULA ANT/CHMB 27GA (MISCELLANEOUS) ×12 IMPLANT
CUP MEDICINE 2OZ PLAST GRAD ST (MISCELLANEOUS) ×3 IMPLANT
FILTER MILLEX .045 (MISCELLANEOUS) ×1 IMPLANT
FLTR MILLEX .045 (MISCELLANEOUS) ×3
GLOVE BIO SURGEON STRL SZ8 (GLOVE) ×3 IMPLANT
GLOVE BIOGEL M 6.5 STRL (GLOVE) ×3 IMPLANT
GLOVE SURG LX 7.5 STRW (GLOVE) ×2
GLOVE SURG LX STRL 7.5 STRW (GLOVE) ×1 IMPLANT
GOWN STRL REUS W/ TWL LRG LVL3 (GOWN DISPOSABLE) ×2 IMPLANT
GOWN STRL REUS W/TWL LRG LVL3 (GOWN DISPOSABLE) ×4
LENS IOL ACRSF IQ PC 21.5 (Intraocular Lens) ×1 IMPLANT
LENS IOL ACRYSOF IQ POST 21.5 (Intraocular Lens) ×3 IMPLANT
PACK CATARACT (MISCELLANEOUS) ×3 IMPLANT
PACK CATARACT BRASINGTON LX (MISCELLANEOUS) ×3 IMPLANT
PACK EYE AFTER SURG (MISCELLANEOUS) ×3 IMPLANT
SOL BSS BAG (MISCELLANEOUS) ×3
SOL PREP PVP 2OZ (MISCELLANEOUS) ×3
SOLUTION BSS BAG (MISCELLANEOUS) ×1 IMPLANT
SOLUTION PREP PVP 2OZ (MISCELLANEOUS) ×1 IMPLANT
SYR 3ML LL SCALE MARK (SYRINGE) ×9 IMPLANT
SYR 5ML LL (SYRINGE) ×3 IMPLANT
SYR TB 1ML 27GX1/2 LL (SYRINGE) ×3 IMPLANT
WATER STERILE IRR 1000ML POUR (IV SOLUTION) ×3 IMPLANT
WIPE NON LINTING 3.25X3.25 (MISCELLANEOUS) ×3 IMPLANT

## 2016-06-12 NOTE — Transfer of Care (Signed)
Immediate Anesthesia Transfer of Care Note  Patient: Brandy Diaz  Procedure(s) Performed: Procedure(s) with comments: CATARACT EXTRACTION PHACO AND INTRAOCULAR LENS PLACEMENT (IOC) (Right) - [place] 6.1 AP% 12.7 CDE .77 FLUID PACK LOT # [id number]  Patient Location: Short Stay  Anesthesia Type:MAC  Level of Consciousness: awake, alert  and oriented  Airway & Oxygen Therapy: Patient Spontanous Breathing  Post-op Assessment: Report given to RN and Post -op Vital signs reviewed and stable  Post vital signs: Reviewed and stable  Last Vitals:  Vitals:   06/12/16 0724  BP: (!) 187/81  Pulse: 78  Resp: 12  Temp: 36.7 C    Last Pain:  Vitals:   06/12/16 0724  TempSrc: Oral         Complications: No apparent anesthesia complications

## 2016-06-12 NOTE — Anesthesia Preprocedure Evaluation (Signed)
Anesthesia Evaluation  Patient identified by MRN, date of birth, ID band Patient awake    Reviewed: Allergy & Precautions, NPO status , Patient's Chart, lab work & pertinent test results, reviewed documented beta blocker date and time   Airway Mallampati: II  TM Distance: >3 FB     Dental  (+) Chipped   Pulmonary           Cardiovascular hypertension,      Neuro/Psych    GI/Hepatic   Endo/Other  diabetes  Renal/GU      Musculoskeletal   Abdominal   Peds  Hematology   Anesthesia Other Findings   Reproductive/Obstetrics                             Anesthesia Physical Anesthesia Plan  ASA: III  Anesthesia Plan: MAC   Post-op Pain Management:    Induction: Intravenous  Airway Management Planned:   Additional Equipment:   Intra-op Plan:   Post-operative Plan:   Informed Consent: I have reviewed the patients History and Physical, chart, labs and discussed the procedure including the risks, benefits and alternatives for the proposed anesthesia with the patient or authorized representative who has indicated his/her understanding and acceptance.     Plan Discussed with: CRNA  Anesthesia Plan Comments:         Anesthesia Quick Evaluation

## 2016-06-12 NOTE — Op Note (Signed)
OPERATIVE NOTE  Brandy Diaz [id number] 06/12/2016   PREOPERATIVE DIAGNOSIS:  Nuclear sclerotic cataract right eye.  H25.11   POSTOPERATIVE DIAGNOSIS:    Nuclear sclerotic cataract right eye.     PROCEDURE:  Complex Phacoemusification with posterior chamber intraocular lens placement of the right eye requiring trypan blue for visualization of the anterior capsule.   LENS:   Implant Name Type Inv. Item Serial No. Manufacturer Lot No. LRB No. Used  IMPLANT LENS - [id number] Intraocular Lens IMPLANT LENS [id number] ALCON   Right 1       [id number] 21.5   ULTRASOUND TIME: 0 minutes 6 seconds.  CDE approx 50.  BSS bottle had to be changed, which reset the [place] time and CDE counter.   SURGEON:  Benay Pillow, MD, MPH  ANESTHESIOLOGIST: Anesthesiologist: Martha Clan, MD; Gunnar Bulla, MD CRNA: Demetrius Charity, CRNA   ANESTHESIA:  Topical with tetracaine drops and 2% Xylocaine jelly, augmented with 1% preservative-free intracameral lidocaine.  ESTIMATED BLOOD LOSS: less than 1 mL.   COMPLICATIONS:  None.   DESCRIPTION OF PROCEDURE:  The patient was identified in the holding room and transported to the operating room and placed in the supine position under the operating microscope.  The right eye was identified as the operative eye and it was prepped and draped in the usual sterile ophthalmic fashion.   A 1.0 millimeter clear-corneal paracentesis was made at the 10:30 position. 0.5 ml of preservative-free 1% lidocaine with epinephrine was injected into the anterior chamber.  There was a poor red reflex, so Trypan blue was instilled under an air bubble and rinsed out with BSS to stain the capsule for improved visualization.   The anterior chamber was filled with Viscoat viscoelastic.  A 2.4 millimeter keratome was used to make a near-clear corneal incision at the 8:00 position.  A curvilinear capsulorrhexis was made with a cystotome and capsulorrhexis forceps.  Balanced salt solution was  used to hydrodissect and hydrodelineate the nucleus.   Phacoemulsification was then used in stop and chop fashion to remove the lens nucleus and epinucleus.  The remaining cortex was then removed using the irrigation and aspiration handpiece. Provisc was then placed into the capsular bag to distend it for lens placement.  A lens was then injected into the capsular bag.  The remaining viscoelastic was aspirated.   Wounds were hydrated with balanced salt solution.  The anterior chamber was inflated to a physiologic pressure with balanced salt solution.    Intracameral cefuroxime 0.1 mL at 10mg /mL was injected into the eye.  No wound leaks were noted.  Topical Vigamox drops were applied to the eye.  The patient was taken to the recovery room in stable condition without complications of anesthesia or surgery  Benay Pillow 06/12/2016, 10:12 AM

## 2016-06-12 NOTE — Anesthesia Postprocedure Evaluation (Signed)
Anesthesia Post Note  Patient: Brandy Diaz  Procedure(s) Performed: Procedure(s) (LRB): CATARACT EXTRACTION PHACO AND INTRAOCULAR LENS PLACEMENT (IOC) (Right)  Patient location during evaluation: Short Stay Anesthesia Type: MAC Level of consciousness: awake and alert and oriented Pain management: satisfactory to patient Vital Signs Assessment: post-procedure vital signs reviewed and stable Respiratory status: respiratory function stable Cardiovascular status: stable Anesthetic complications: no    Last Vitals:  Vitals:   06/12/16 0724  BP: (!) 187/81  Pulse: 78  Resp: 12  Temp: 36.7 C    Last Pain:  Vitals:   06/12/16 0724  TempSrc: Oral                 Blima Singer

## 2016-06-12 NOTE — Discharge Instructions (Signed)
Eye Surgery Discharge Instructions  Expect mild scratchy sensation or mild soreness. DO NOT RUB YOUR EYE!  The day of surgery:  Minimal physical activity, but bed rest is not required  No reading, computer work, or close hand work  No bending, lifting, or straining.  May watch TV  For 24 hours:  No driving, legal decisions, or alcoholic beverages  Safety precautions  Eat anything you prefer: It is better to start with liquids, then soup then solid foods.  _____ Eye patch should be worn until postoperative exam tomorrow.  ____ Solar shield eyeglasses should be worn for comfort in the sunlight/patch while sleeping  Resume all regular medications including aspirin or Coumadin if these were discontinued prior to surgery. You may shower, bathe, shave, or wash your hair. Tylenol may be taken for mild discomfort.  Call your doctor if you experience significant pain, nausea, or vomiting, fever > 101 or other signs of infection. [phone] or [phone] Specific instructions:  Follow-up Information    Benay Pillow, MD .   Specialty:  Ophthalmology Why:  August 4 at 9:30am Contact information: [address] [place] [address] [phone]

## 2016-06-12 NOTE — Anesthesia Procedure Notes (Signed)
Procedure Name: MAC Performed by: Maahir Horst Pre-anesthesia Checklist: Patient identified, Emergency Drugs available, Suction available, Patient being monitored and Timeout performed Oxygen Delivery Method: Nasal cannula       

## 2016-06-12 NOTE — H&P (Signed)
  The History and Physical notes are on paper, have been signed, and are to be scanned. The patient remains stable and unchanged from the H&P.   Previous H&P reviewed, patient examined, and there are no changes.  Benay Pillow 06/12/2016 7:51 AM

## 2016-06-13 ENCOUNTER — Other Ambulatory Visit: Payer: Self-pay | Admitting: Internal Medicine

## 2016-08-15 ENCOUNTER — Other Ambulatory Visit: Payer: Self-pay | Admitting: Internal Medicine

## 2016-08-27 NOTE — Telephone Encounter (Signed)
pts coming in on 10/24

## 2016-09-02 ENCOUNTER — Encounter: Payer: Self-pay | Admitting: Internal Medicine

## 2016-09-02 ENCOUNTER — Ambulatory Visit (INDEPENDENT_AMBULATORY_CARE_PROVIDER_SITE_OTHER): Payer: Medicare Other | Admitting: Internal Medicine

## 2016-09-02 VITALS — BP 140/82 | HR 84 | Resp 16 | Ht 68.0 in | Wt 141.0 lb

## 2016-09-02 DIAGNOSIS — E1122 Type 2 diabetes mellitus with diabetic chronic kidney disease: Secondary | ICD-10-CM | POA: Diagnosis not present

## 2016-09-02 DIAGNOSIS — E785 Hyperlipidemia, unspecified: Secondary | ICD-10-CM | POA: Diagnosis not present

## 2016-09-02 DIAGNOSIS — N183 Chronic kidney disease, stage 3 (moderate): Secondary | ICD-10-CM | POA: Diagnosis not present

## 2016-09-02 DIAGNOSIS — E1169 Type 2 diabetes mellitus with other specified complication: Secondary | ICD-10-CM | POA: Diagnosis not present

## 2016-09-02 DIAGNOSIS — I10 Essential (primary) hypertension: Secondary | ICD-10-CM | POA: Diagnosis not present

## 2016-09-02 DIAGNOSIS — K219 Gastro-esophageal reflux disease without esophagitis: Secondary | ICD-10-CM

## 2016-09-02 MED ORDER — OMEPRAZOLE 20 MG PO CPDR: 20.0000 mg | DELAYED_RELEASE_CAPSULE | Freq: Every day | ORAL | 12 refills | Status: DC

## 2016-09-02 NOTE — Patient Instructions (Signed)
DASH Eating Plan  DASH stands for "Dietary Approaches to Stop Hypertension." The DASH eating plan is a healthy eating plan that has been shown to reduce high blood pressure (hypertension). Additional health benefits may include reducing the risk of type 2 diabetes mellitus, heart disease, and stroke. The DASH eating plan may also help with weight loss.  WHAT DO I NEED TO KNOW ABOUT THE DASH EATING PLAN?  For the DASH eating plan, you will follow these general guidelines:  · Choose foods with a percent daily value for sodium of less than 5% (as listed on the food label).  · Use salt-free seasonings or herbs instead of table salt or sea salt.  · Check with your health care provider or pharmacist before using salt substitutes.  · Eat lower-sodium products, often labeled as "lower sodium" or "no salt added."  · Eat fresh foods.  · Eat more vegetables, fruits, and low-fat dairy products.  · Choose whole grains. Look for the word "whole" as the first word in the ingredient list.  · Choose fish and skinless chicken or turkey more often than red meat. Limit fish, poultry, and meat to 6 oz (170 g) each day.  · Limit sweets, desserts, sugars, and sugary drinks.  · Choose heart-healthy fats.  · Limit cheese to 1 oz (28 g) per day.  · Eat more home-cooked food and less restaurant, buffet, and fast food.  · Limit fried foods.  · Cook foods using methods other than frying.  · Limit canned vegetables. If you do use them, rinse them well to decrease the sodium.  · When eating at a restaurant, ask that your food be prepared with less salt, or no salt if possible.  WHAT FOODS CAN I EAT?  Seek help from a dietitian for individual calorie needs.  Grains  Whole grain or whole wheat bread. Brown rice. Whole grain or whole wheat pasta. Quinoa, bulgur, and whole grain cereals. Low-sodium cereals. Corn or whole wheat flour tortillas. Whole grain cornbread. Whole grain crackers. Low-sodium crackers.  Vegetables  Fresh or frozen vegetables  (raw, steamed, roasted, or grilled). Low-sodium or reduced-sodium tomato and vegetable juices. Low-sodium or reduced-sodium tomato sauce and paste. Low-sodium or reduced-sodium canned vegetables.   Fruits  All fresh, canned (in natural juice), or frozen fruits.  Meat and Other Protein Products  Ground beef (85% or leaner), grass-fed beef, or beef trimmed of fat. Skinless chicken or turkey. Ground chicken or turkey. Pork trimmed of fat. All fish and seafood. Eggs. Dried beans, peas, or lentils. Unsalted nuts and seeds. Unsalted canned beans.  Dairy  Low-fat dairy products, such as skim or 1% milk, 2% or reduced-fat cheeses, low-fat ricotta or cottage cheese, or plain low-fat yogurt. Low-sodium or reduced-sodium cheeses.  Fats and Oils  Tub margarines without trans fats. Light or reduced-fat mayonnaise and salad dressings (reduced sodium). Avocado. Safflower, olive, or canola oils. Natural peanut or almond butter.  Other  Unsalted popcorn and pretzels.  The items listed above may not be a complete list of recommended foods or beverages. Contact your dietitian for more options.  WHAT FOODS ARE NOT RECOMMENDED?  Grains  White bread. White pasta. White rice. Refined cornbread. Bagels and croissants. Crackers that contain trans fat.  Vegetables  Creamed or fried vegetables. Vegetables in a cheese sauce. Regular canned vegetables. Regular canned tomato sauce and paste. Regular tomato and vegetable juices.  Fruits  Dried fruits. Canned fruit in light or heavy syrup. Fruit juice.  Meat and Other Protein   Products  Fatty cuts of meat. Ribs, chicken wings, bacon, sausage, bologna, salami, chitterlings, fatback, hot dogs, bratwurst, and packaged luncheon meats. Salted nuts and seeds. Canned beans with salt.  Dairy  Whole or 2% milk, cream, half-and-half, and cream cheese. Whole-fat or sweetened yogurt. Full-fat cheeses or blue cheese. Nondairy creamers and whipped toppings. Processed cheese, cheese spreads, or cheese  curds.  Condiments  Onion and garlic salt, seasoned salt, table salt, and sea salt. Canned and packaged gravies. Worcestershire sauce. Tartar sauce. Barbecue sauce. Teriyaki sauce. Soy sauce, including reduced sodium. Steak sauce. Fish sauce. Oyster sauce. Cocktail sauce. Horseradish. Ketchup and mustard. Meat flavorings and tenderizers. Bouillon cubes. Hot sauce. Tabasco sauce. Marinades. Taco seasonings. Relishes.  Fats and Oils  Butter, stick margarine, lard, shortening, ghee, and bacon fat. Coconut, palm kernel, or palm oils. Regular salad dressings.  Other  Pickles and olives. Salted popcorn and pretzels.  The items listed above may not be a complete list of foods and beverages to avoid. Contact your dietitian for more information.  WHERE CAN I FIND MORE INFORMATION?  National Heart, [employer], and [employer] Institute: www.nhlbi.nih.gov/health/health-topics/topics/dash/     This information is not intended to replace advice given to you by your health care provider. Make sure you discuss any questions you have with your health care provider.     Document Released: 10/16/2011 Document Revised: 11/17/2014 Document Reviewed: 08/31/2013  Elsevier Interactive Patient Education ©2016 [employer] Inc[employer]

## 2016-09-02 NOTE — Progress Notes (Signed)
Date:  09/02/2016   Name:  Brandy Diaz   DOB:  [birth year]   MRN:  [id number]   Chief Complaint: Diabetes (BS range 100-130); Hypertension (refills); Gastroesophageal Reflux (refills); and Hyperlipidemia (refills)  Diabetes  She presents for her follow-up diabetic visit. She has type 2 diabetes mellitus. Her disease course has been stable. Pertinent negatives for hypoglycemia include no headaches or tremors. Pertinent negatives for diabetes include no chest pain, no fatigue, no polydipsia and no polyuria. She is following a generally healthy diet. Her breakfast blood glucose is taken between 6-7 am. Her breakfast blood glucose range is generally 110-130 mg/dl. An ACE inhibitor/angiotensin II receptor blocker is not being taken.  Hypertension  Pertinent negatives include no chest pain, headaches, palpitations or shortness of breath.  Gastroesophageal Reflux  She reports no abdominal pain, no chest pain or no coughing. Pertinent negatives include no fatigue.  Hyperlipidemia  Pertinent negatives include no chest pain or shortness of breath.   Lab Results  Component Value Date   HGBA1C 6.0 (H) 12/31/2015   Lab Results  Component Value Date   CHOL 174 12/31/2015   HDL 74 12/31/2015   LDLCALC 86 12/31/2015   TRIG 69 12/31/2015   CHOLHDL 2.4 12/31/2015   Lab Results  Component Value Date   CREATININE 1.20 (H) 12/31/2015     Review of Systems  Constitutional: Negative for appetite change, fatigue, fever and unexpected weight change.  HENT: Negative for hearing loss, tinnitus and trouble swallowing.   Eyes: Negative for visual disturbance.  Respiratory: Negative for cough, chest tightness and shortness of breath.   Cardiovascular: Negative for chest pain, palpitations and leg swelling.  Gastrointestinal: Negative for abdominal pain, constipation and diarrhea.  Endocrine: Negative for polydipsia and polyuria.  Genitourinary: Negative for dysuria and hematuria.    Musculoskeletal: Negative for arthralgias.  Skin: Negative for rash.  Neurological: Negative for tremors, numbness and headaches.  Psychiatric/Behavioral: Negative for dysphoric mood and sleep disturbance.    Patient Active Problem List   Diagnosis Date Noted  . Chronic renal insufficiency, stage III (moderate) 01/01/2016  . Essential (primary) hypertension 07/24/2015  . Gastro-esophageal reflux disease without esophagitis 07/24/2015  . Combined fat and carbohydrate induced hyperlipemia 07/24/2015  . Type 2 diabetes mellitus with stage 3 chronic kidney disease, without long-term current use of insulin ([place]) 07/24/2015  . Unexplained weight loss 07/24/2015  . Fibrocystic breast changes 07/24/2015    Prior to Admission medications   Medication Sig Start Date End Date Taking? Authorizing Provider  aspirin EC 81 MG tablet Take 81 mg by mouth daily as needed (for sinus or pain).    Yes Historical Provider, MD  atorvastatin (LIPITOR) 10 MG tablet Take 1 tablet (10 mg total) by mouth daily. 12/31/15  Yes Glean Hess, MD  fluticasone (FLONASE) 50 MCG/ACT nasal spray Place 2 sprays into both nostrils daily. 07/25/15  Yes Glean Hess, MD  Glucose Blood (BAYER BREEZE 2 TEST) DISK BAYER BREEZE 2 TEST (In Vitro Disk)  1 (one) Each Each daily for 50 days  Quantity: 5;  Refills: 5   Ordered :27-Dec-2014  Alison Stalling ;  Started 26-Jun-2014 Active Comments: 250.02 Patient taking differently: 1 each by Other route See admin instructions. Check blood sugar once daily  BAYER BREEZE 2 TEST (In Vitro Disk)  1 (one) Each Each daily for 50 days  Quantity: 5;  Refills: 5   Ordered :27-Dec-2014  Alison Stalling ;  Started 26-Jun-2014 Active Comments: 250.02 11/22/15  Yes  Glean Hess, MD  metoprolol succinate (TOPROL-XL) 25 MG 24 hr tablet Take 1 tablet (25 mg total) by mouth 2 (two) times daily. 12/31/15  Yes Glean Hess, MD  Multiple Vitamins-Calcium (ONE-A-DAY WOMENS PO) Take 1  tablet by mouth every other day.   Yes Historical Provider, MD  omeprazole (PRILOSEC) 20 MG capsule TAKE ONE CAPSULE BY MOUTH EVERY DAY 08/15/16  Yes Glean Hess, MD    Allergies  Allergen Reactions  . Ace Inhibitors Other (See Comments)    Other reaction(s): Cough  . Hydrochlorothiazide Other (See Comments)    Other reaction(s): Dizziness  . Triamterene-Hctz Other (See Comments)    Appetite loss    Past Surgical History:  Procedure Laterality Date  . ABDOMINAL HYSTERECTOMY    . CATARACT EXTRACTION W/PHACO Right 06/12/2016   Procedure: CATARACT EXTRACTION PHACO AND INTRAOCULAR LENS PLACEMENT (IOC);  Surgeon: Eulogio Bear, MD;  Location: [facility] ORS;  Service: Ophthalmology;  Laterality: Right;  [place] 6.1 AP% 12.7 CDE .77 FLUID PACK LOT # [id number]  . COLONOSCOPY      Social History  Substance Use Topics  . Smoking status: Never Smoker  . Smokeless tobacco: Never Used  . Alcohol use No     Medication list has been reviewed and updated.   Physical Exam  Constitutional: She is oriented to person, place, and time. She appears well-developed. No distress.  HENT:  Head: Normocephalic and atraumatic.  Cardiovascular: Normal rate, regular rhythm and normal heart sounds.   No extrasystoles are present.  Pulmonary/Chest: Effort normal and breath sounds normal. No respiratory distress. She has no decreased breath sounds. She has no wheezes.  Musculoskeletal: Normal range of motion.  Neurological: She is alert and oriented to person, place, and time.  Skin: Skin is warm, dry and intact. No rash noted.  Psychiatric: She has a normal mood and affect. Her speech is normal and behavior is normal. Thought content normal.  Nursing note and vitals reviewed.   BP 140/82   Pulse 84   Resp 16   Ht 5\' 8"  (1.727 m)   Wt 141 lb (64 kg)   SpO2 100%   BMI 21.44 kg/m   Assessment and Plan: 1. Type 2 diabetes mellitus with stage 3 chronic kidney disease, without long-term current use of  insulin ([facility]) controlled - Comprehensive metabolic panel - Hemoglobin A1c  2. Essential (primary) hypertension Stable No recurrence of ectopy  3. Gastro-esophageal reflux disease without esophagitis Controlled with daily PPI - omeprazole (PRILOSEC) 20 MG capsule; Take 1 capsule (20 mg total) by mouth daily.  Dispense: 30 capsule; Refill: 12  4. Hyperlipidemia associated with type 2 diabetes mellitus ([place]) On statin therapy   Halina Maidens, MD [facility]  09/02/2016

## 2016-09-03 LAB — COMPREHENSIVE METABOLIC PANEL
ALBUMIN: 4.1 g/dL (ref 3.5–4.8)
ALT: 14 IU/L (ref 0–32)
AST: 16 IU/L (ref 0–40)
Albumin/Globulin Ratio: 1.2 (ref 1.2–2.2)
Alkaline Phosphatase: 80 IU/L (ref 39–117)
BILIRUBIN TOTAL: 0.4 mg/dL (ref 0.0–1.2)
BUN / CREAT RATIO: 11 — AB (ref 12–28)
BUN: 12 mg/dL (ref 8–27)
CO2: 26 mmol/L (ref 18–29)
CREATININE: 1.1 mg/dL — AB (ref 0.57–1.00)
Calcium: 9.5 mg/dL (ref 8.7–10.3)
Chloride: 103 mmol/L (ref 96–106)
GFR calc non Af Amer: 51 mL/min/{1.73_m2} — ABNORMAL LOW (ref >59)
GFR, EST AFRICAN AMERICAN: 58 mL/min/{1.73_m2} — AB (ref >59)
GLUCOSE: 74 mg/dL (ref 65–99)
Globulin, Total: 3.4 g/dL (ref 1.5–4.5)
Potassium: 4.3 mmol/L (ref 3.5–5.2)
Sodium: 143 mmol/L (ref 134–144)
TOTAL PROTEIN: 7.5 g/dL (ref 6.0–8.5)

## 2016-09-03 LAB — HEMOGLOBIN A1C
Est. average glucose Bld gHb Est-mCnc: 126 mg/dL
Hgb A1c MFr Bld: 6 % — ABNORMAL HIGH (ref 4.8–5.6)

## 2016-09-23 ENCOUNTER — Other Ambulatory Visit: Payer: Self-pay

## 2016-09-23 DIAGNOSIS — I10 Essential (primary) hypertension: Secondary | ICD-10-CM

## 2016-09-23 DIAGNOSIS — K219 Gastro-esophageal reflux disease without esophagitis: Secondary | ICD-10-CM

## 2016-09-23 MED ORDER — OMEPRAZOLE 20 MG PO CPDR: 20.0000 mg | DELAYED_RELEASE_CAPSULE | Freq: Every day | ORAL | 3 refills | Status: DC

## 2016-09-23 MED ORDER — METOPROLOL SUCCINATE ER 25 MG PO TB24: 25.0000 mg | ORAL_TABLET | Freq: Two times a day (BID) | ORAL | 3 refills | Status: DC

## 2016-09-23 MED ORDER — ACCU-CHEK SOFTCLIX LANCETS MISC: 1.0000 | Freq: Two times a day (BID) | 12 refills | Status: DC

## 2016-09-26 ENCOUNTER — Other Ambulatory Visit: Payer: Self-pay | Admitting: Internal Medicine

## 2016-09-26 DIAGNOSIS — E782 Mixed hyperlipidemia: Secondary | ICD-10-CM

## 2016-09-26 MED ORDER — ATORVASTATIN CALCIUM 10 MG PO TABS: 10.0000 mg | ORAL_TABLET | Freq: Every day | ORAL | 3 refills | Status: DC

## 2016-11-10 DIAGNOSIS — C50919 Malignant neoplasm of unspecified site of unspecified female breast: Secondary | ICD-10-CM

## 2016-11-10 HISTORY — PX: BREAST BIOPSY: SHX20

## 2016-11-10 HISTORY — DX: Malignant neoplasm of unspecified site of unspecified female breast: C50.919

## 2017-01-19 ENCOUNTER — Ambulatory Visit (INDEPENDENT_AMBULATORY_CARE_PROVIDER_SITE_OTHER): Payer: Medicare Other | Admitting: Internal Medicine

## 2017-01-19 ENCOUNTER — Other Ambulatory Visit: Payer: Self-pay | Admitting: Internal Medicine

## 2017-01-19 ENCOUNTER — Encounter: Payer: Self-pay | Admitting: Internal Medicine

## 2017-01-19 VITALS — BP 138/86 | HR 78 | Ht 68.0 in | Wt 142.2 lb

## 2017-01-19 DIAGNOSIS — H6982 Other specified disorders of Eustachian tube, left ear: Secondary | ICD-10-CM

## 2017-01-19 DIAGNOSIS — E785 Hyperlipidemia, unspecified: Secondary | ICD-10-CM | POA: Diagnosis not present

## 2017-01-19 DIAGNOSIS — K219 Gastro-esophageal reflux disease without esophagitis: Secondary | ICD-10-CM | POA: Diagnosis not present

## 2017-01-19 DIAGNOSIS — N183 Chronic kidney disease, stage 3 unspecified: Secondary | ICD-10-CM

## 2017-01-19 DIAGNOSIS — R928 Other abnormal and inconclusive findings on diagnostic imaging of breast: Secondary | ICD-10-CM | POA: Diagnosis not present

## 2017-01-19 DIAGNOSIS — E1122 Type 2 diabetes mellitus with diabetic chronic kidney disease: Secondary | ICD-10-CM | POA: Diagnosis not present

## 2017-01-19 DIAGNOSIS — E1169 Type 2 diabetes mellitus with other specified complication: Secondary | ICD-10-CM

## 2017-01-19 DIAGNOSIS — I10 Essential (primary) hypertension: Secondary | ICD-10-CM | POA: Diagnosis not present

## 2017-01-19 DIAGNOSIS — Z0000 Encounter for general adult medical examination without abnormal findings: Secondary | ICD-10-CM

## 2017-01-19 LAB — POCT URINALYSIS DIPSTICK
BILIRUBIN UA: NEGATIVE
Blood, UA: NEGATIVE
GLUCOSE UA: NEGATIVE
KETONES UA: NEGATIVE
LEUKOCYTES UA: NEGATIVE
Nitrite, UA: NEGATIVE
Protein, UA: NEGATIVE
Spec Grav, UA: 1.02
Urobilinogen, UA: 0.2
pH, UA: 5

## 2017-01-19 MED ORDER — GLUCOSE BLOOD VI STRP: ORAL_STRIP | 12 refills | Status: DC

## 2017-01-19 MED ORDER — FLUTICASONE PROPIONATE 50 MCG/ACT NA SUSP: 2.0000 | Freq: Every day | NASAL | 3 refills | Status: DC

## 2017-01-19 NOTE — Patient Instructions (Signed)
Health Maintenance  Topic Date Due  . FOOT EXAM  12/30/2016  . URINE MICROALBUMIN  12/30/2016  . DEXA SCAN  12/30/2020 (Originally 12/10/2009)  . Hepatitis C Screening  12/30/2020 (Originally September 04, 1946)  . OPHTHALMOLOGY EXAM  04/01/2017  . HEMOGLOBIN A1C  07/22/2017  . MAMMOGRAM  03/03/2018  . COLONOSCOPY  08/02/2022  . TETANUS/TDAP  05/12/2023  . INFLUENZA VACCINE  Completed  . PNA vac Low Risk Adult  Completed     Breast Self-Awareness Breast self-awareness means being familiar with how your breasts look and feel. It involves checking your breasts regularly and reporting any changes to your health care provider. Practicing breast self-awareness is important. A change in your breasts can be a sign of a serious medical problem. Being familiar with how your breasts look and feel allows you to find any problems early, when treatment is more likely to be successful. All women should practice breast self-awareness, including women who have had breast implants. How to do a breast self-exam One way to learn what is normal for your breasts and whether your breasts are changing is to do a breast self-exam. To do a breast self-exam: Look for Changes   1. Remove all the clothing above your waist. 2. Stand in front of a mirror in a room with good lighting. 3. Put your hands on your hips. 4. Push your hands firmly downward. 5. Compare your breasts in the mirror. Look for differences between them (asymmetry), such as:  Differences in shape.  Differences in size.  Puckers, dips, and bumps in one breast and not the other. 6. Look at each breast for changes in your skin, such as:  Redness.  Scaly areas. 7. Look for changes in your nipples, such as:  Discharge.  Bleeding.  Dimpling.  Redness.  A change in position. Feel for Changes   Carefully feel your breasts for lumps and changes. It is best to do this while lying on your back on the floor and again while sitting or standing in  the shower or tub with soapy water on your skin. Feel each breast in the following way:  Place the arm on the side of the breast you are examining above your head.  Feel your breast with the other hand.  Start in the nipple area and make  inch (2 cm) overlapping circles to feel your breast. Use the pads of your three middle fingers to do this. Apply light pressure, then medium pressure, then firm pressure. The light pressure will allow you to feel the tissue closest to the skin. The medium pressure will allow you to feel the tissue that is a little deeper. The firm pressure will allow you to feel the tissue close to the ribs.  Continue the overlapping circles, moving downward over the breast until you feel your ribs below your breast.  Move one finger-width toward the center of the body. Continue to use the  inch (2 cm) overlapping circles to feel your breast as you move slowly up toward your collarbone.  Continue the up and down exam using all three pressures until you reach your armpit. Write Down What You Find   Write down what is normal for each breast and any changes that you find. Keep a written record with breast changes or normal findings for each breast. By writing this information down, you do not need to depend only on memory for size, tenderness, or location. Write down where you are in your menstrual cycle, if you are  still menstruating. If you are having trouble noticing differences in your breasts, do not get discouraged. With time you will become more familiar with the variations in your breasts and more comfortable with the exam. How often should I examine my breasts? Examine your breasts every month. If you are breastfeeding, the best time to examine your breasts is after a feeding or after using a breast pump. If you menstruate, the best time to examine your breasts is 5-7 days after your period is over. During your period, your breasts are lumpier, and it may be more difficult  to notice changes. When should I see my health care provider? See your health care provider if you notice:  A change in shape or size of your breasts or nipples.  A change in the skin of your breast or nipples, such as a reddened or scaly area.  Unusual discharge from your nipples.  A lump or thick area that was not there before.  Pain in your breasts.  Anything that concerns you. This information is not intended to replace advice given to you by your health care provider. Make sure you discuss any questions you have with your health care provider. Document Released: 10/27/2005 Document Revised: 04/03/2016 Document Reviewed: 09/16/2015 Elsevier Interactive Patient Education  2017 [employer].

## 2017-01-19 NOTE — Progress Notes (Signed)
Patient: Brandy Diaz, Female    DOB: 1945-12-28, 71 y.o.   MRN: [id number] Visit Date: 01/19/2017  Today's Provider: Halina Maidens, MD   Chief Complaint  Patient presents with  . Annual Exam    Test Strips and Nasal spray need refilled.   Subjective:    Annual wellness visit Brandy Diaz is a 70 y.o. female who presents today for her Subsequent Annual Wellness Visit. She feels well. She reports exercising working around the house. She reports she is sleeping well. She feels down since she lost her job due to downsizing.    ----------------------------------------------------------- Diabetes  She presents for her follow-up diabetic visit. She has type 2 diabetes mellitus. Her disease course has been stable. Pertinent negatives for hypoglycemia include no dizziness, headaches, nervousness/anxiousness or tremors. Pertinent negatives for diabetes include no chest pain, no fatigue, no polydipsia and no polyuria. She monitors blood glucose at home 1-2 x per day. There is no change in her home blood glucose trend. Her breakfast blood glucose is taken between 7-8 am. Her breakfast blood glucose range is generally 110-130 mg/dl. An ACE inhibitor/angiotensin II receptor blocker is being taken.  Hypertension  This is a chronic problem. The current episode started more than 1 year ago. The problem is unchanged. The problem is controlled. Pertinent negatives include no chest pain, headaches, palpitations or shortness of breath.    Review of Systems  Constitutional: Negative for chills, fatigue and fever.  HENT: Negative for congestion, hearing loss, tinnitus, trouble swallowing and voice change.   Eyes: Negative for visual disturbance.  Respiratory: Negative for cough, chest tightness, shortness of breath and wheezing.   Cardiovascular: Negative for chest pain, palpitations and leg swelling.  Gastrointestinal: Negative for abdominal pain, constipation, diarrhea and vomiting.  Endocrine:  Negative for polydipsia and polyuria.  Genitourinary: Negative for dysuria, frequency, genital sores, vaginal bleeding and vaginal discharge.  Musculoskeletal: Negative for arthralgias, gait problem and joint swelling.  Skin: Negative for color change and rash.  Neurological: Negative for dizziness, tremors, light-headedness and headaches.  Hematological: Negative for adenopathy. Does not bruise/bleed easily.  Psychiatric/Behavioral: Positive for dysphoric mood (since lost her job). Negative for sleep disturbance. The patient is not nervous/anxious.     Social History   Social History  . Marital status: Married    Spouse name: N/A  . Number of children: N/A  . Years of education: N/A   Occupational History  . Not on file.   Social History Main Topics  . Smoking status: Never Smoker  . Smokeless tobacco: Never Used  . Alcohol use No  . Drug use: No  . Sexual activity: Not on file   Other Topics Concern  . Not on file   Social History Narrative  . No narrative on file    Patient Active Problem List   Diagnosis Date Noted  . Essential (primary) hypertension 07/24/2015  . Gastro-esophageal reflux disease without esophagitis 07/24/2015  . Hyperlipidemia associated with type 2 diabetes mellitus ([place]) 07/24/2015  . Type 2 diabetes mellitus with stage 3 chronic kidney disease, without long-term current use of insulin ([place]) 07/24/2015  . Unexplained weight loss 07/24/2015  . Fibrocystic breast changes 07/24/2015    Past Surgical History:  Procedure Laterality Date  . ABDOMINAL HYSTERECTOMY    . CATARACT EXTRACTION W/PHACO Right 06/12/2016   Procedure: CATARACT EXTRACTION PHACO AND INTRAOCULAR LENS PLACEMENT (IOC);  Surgeon: Eulogio Bear, MD;  Location: [facility] ORS;  Service: Ophthalmology;  Laterality: Right;  [place] 6.1 AP%  12.7 CDE .77 FLUID PACK LOT # [id number]  . COLONOSCOPY      Her family history includes Breast cancer in her sister; Breast cancer (age of onset: 46) in  her maternal aunt; Breast cancer (age of onset: 51) in her mother; CAD in her father; Diabetes in her mother.     Previous Medications   ACCU-CHEK SOFTCLIX LANCETS LANCETS    1 each by Other route 2 (two) times daily. Use as instructed   ASPIRIN EC 81 MG TABLET    Take 81 mg by mouth daily as needed (for sinus or pain).    ATORVASTATIN (LIPITOR) 10 MG TABLET    Take 1 tablet (10 mg total) by mouth daily.   METOPROLOL SUCCINATE (TOPROL-XL) 25 MG 24 HR TABLET    Take 1 tablet (25 mg total) by mouth 2 (two) times daily.   MULTIPLE VITAMINS-CALCIUM (ONE-A-DAY WOMENS PO)    Take 1 tablet by mouth every other day.   OMEPRAZOLE (PRILOSEC) 20 MG CAPSULE    Take 1 capsule (20 mg total) by mouth daily.    Patient Care Team: Glean Hess, MD as PCP - General (Family Medicine)      Objective:   Vitals: BP 138/86   Pulse 78   Ht 5\' 8"  (1.727 m)   Wt 142 lb 3.2 oz (64.5 kg)   SpO2 100%   BMI 21.62 kg/m   Physical Exam  Constitutional: She is oriented to person, place, and time. She appears well-developed and well-nourished. No distress.  HENT:  Head: Normocephalic and atraumatic.  Right Ear: Tympanic membrane and ear canal normal.  Left Ear: Tympanic membrane and ear canal normal.  Nose: Right sinus exhibits no maxillary sinus tenderness. Left sinus exhibits no maxillary sinus tenderness.  Mouth/Throat: Uvula is midline and oropharynx is clear and moist.  Eyes: Conjunctivae and EOM are normal. Right eye exhibits no discharge. Left eye exhibits no discharge. No scleral icterus.  Neck: Normal range of motion. Carotid bruit is not present. No erythema present. No thyromegaly present.  Cardiovascular: Normal rate, regular rhythm, normal heart sounds and normal pulses.   Pulmonary/Chest: Effort normal. No respiratory distress. She has no wheezes. Right breast exhibits no mass, no nipple discharge, no skin change and no tenderness. Left breast exhibits no mass, no nipple discharge, no skin change  and no tenderness.  Abdominal: Soft. Bowel sounds are normal. There is no hepatosplenomegaly. There is no tenderness. There is no CVA tenderness.  Musculoskeletal: Normal range of motion.  Lymphadenopathy:    She has no cervical adenopathy.    She has no axillary adenopathy.  Neurological: She is alert and oriented to person, place, and time. She has normal reflexes. No cranial nerve deficit or sensory deficit.  Skin: Skin is warm, dry and intact. No rash noted.  Psychiatric: She has a normal mood and affect. Her speech is normal and behavior is normal. Thought content normal. Cognition and memory are normal.  Nursing note and vitals reviewed.   Activities of Daily Living In your present state of health, do you have any difficulty performing the following activities: 01/19/2017  Hearing? Y  Vision? N  Difficulty concentrating or making decisions? N  Walking or climbing stairs? N  Dressing or bathing? N  Doing errands, shopping? N  Preparing Food and eating ? N  Using the Toilet? N  In the past six months, have you accidently leaked urine? N  Do you have problems with loss of bowel control? N  Managing  your Medications? N  Managing your Finances? N  Housekeeping or managing your Housekeeping? N  Some recent data might be hidden    Fall Risk Assessment Fall Risk  01/19/2017 12/31/2015 07/25/2015  Falls in the past year? No No No      Depression Screen PHQ 2/9 Scores 01/19/2017 12/31/2015 07/25/2015  PHQ - 2 Score 2 0 2  PHQ- 9 Score 7 - 5    6CIT Screen 01/19/2017  What Year? 0 points  What month? 0 points  What time? 0 points  Count back from 20 0 points  Months in reverse 2 points  Repeat phrase 0 points  Total Score 2     Medicare Annual Wellness Visit Summary:  Reviewed patient's Family Medical History Reviewed and updated list of patient's medical providers Assessment of cognitive impairment was done Assessed patient's functional ability Established a written  schedule for health screening [place] Completed and Reviewed  Exercise Activities and Dietary recommendations Goals    None      Immunization History  Administered Date(s) Administered  . Influenza-Unspecified 09/26/2015, 08/24/2016  . Pneumococcal Conjugate-13 12/27/2014  . Pneumococcal Polysaccharide-23 11/11/2012  . Tdap 05/11/2013    Health Maintenance  Topic Date Due  . FOOT EXAM  12/30/2016  . URINE MICROALBUMIN  12/30/2016  . DEXA SCAN  12/30/2020 (Originally 12/10/2009)  . Hepatitis C Screening  12/30/2020 (Originally 23-Jul-1946)  . OPHTHALMOLOGY EXAM  04/01/2017  . HEMOGLOBIN A1C  07/22/2017  . MAMMOGRAM  03/03/2018  . COLONOSCOPY  08/02/2022  . TETANUS/TDAP  05/12/2023  . INFLUENZA VACCINE  Completed  . PNA vac Low Risk Adult  Completed    Discussed health benefits of physical activity, and encouraged her to engage in regular exercise appropriate for her age and condition.    ------------------------------------------------------------------------------------------------------------  Assessment & Plan:   1. Medicare annual wellness visit, subsequent Measures satisfied Mild mood disorder situational - discussed finding a volunteer opportunity as well as indications for return for medication if needed - POCT urinalysis dipstick  2. Essential (primary) hypertension controlled - CBC with Differential/Platelet - TSH  3. Type 2 diabetes mellitus with stage 3 chronic kidney disease, without long-term current use of insulin (HCC) stable - glucose blood (ACCU-CHEK AVIVA PLUS) test strip; Use to test blood sugar daily  Dispense: 100 each; Refill: 12 - Comprehensive metabolic panel - Hemoglobin A1c - Microalbumin / creatinine urine ratio; Future - Microalbumin / creatinine urine ratio  4. Gastro-esophageal reflux disease without esophagitis Controlled on PPI  5. Hyperlipidemia associated with type 2 diabetes mellitus (HCC) Continue  statin therapy - Lipid panel  6. Eustachian tube dysfunction, left - fluticasone (FLONASE) 50 MCG/ACT nasal spray; Place 2 sprays into both nostrils daily.  Dispense: 48 g; Refill: 3  7. Abnormal mammogram of left breast - MM Digital Diagnostic Bilat; Future - US BREAST LTD UNI LEFT INC AXILLA; Future - US BREAST LTD UNI RIGHT INC AXILLA; Future - US BREAST LTD UNI RIGHT INC AXILLA   Meds ordered this encounter  Medications  . fluticasone (FLONASE) 50 MCG/ACT nasal spray    Sig: Place 2 sprays into both nostrils daily.    Dispense:  48 g    Refill:  3  . glucose blood (ACCU-CHEK AVIVA PLUS) test strip    Sig: Use to test blood sugar daily    Dispense:  100 each    Refill:  12    Dx E11.9    Halina Maidens, MD [facility]  01/19/2017

## 2017-01-20 ENCOUNTER — Other Ambulatory Visit: Payer: Self-pay

## 2017-01-20 DIAGNOSIS — R928 Other abnormal and inconclusive findings on diagnostic imaging of breast: Secondary | ICD-10-CM

## 2017-01-20 LAB — CBC WITH DIFFERENTIAL/PLATELET
BASOS: 2 %
Basophils Absolute: 0.1 10*3/uL (ref 0.0–0.2)
EOS (ABSOLUTE): 0.2 10*3/uL (ref 0.0–0.4)
EOS: 4 %
HEMOGLOBIN: 13.5 g/dL (ref 11.1–15.9)
Hematocrit: 40.7 % (ref 34.0–46.6)
IMMATURE GRANS (ABS): 0 10*3/uL (ref 0.0–0.1)
IMMATURE GRANULOCYTES: 0 %
LYMPHS: 16 %
Lymphocytes Absolute: 0.8 10*3/uL (ref 0.7–3.1)
MCH: 29.2 pg (ref 26.6–33.0)
MCHC: 33.2 g/dL (ref 31.5–35.7)
MCV: 88 fL (ref 79–97)
MONOCYTES: 6 %
Monocytes Absolute: 0.3 10*3/uL (ref 0.1–0.9)
NEUTROS ABS: 3.8 10*3/uL (ref 1.4–7.0)
NEUTROS PCT: 72 %
PLATELETS: 341 10*3/uL (ref 150–379)
RBC: 4.62 x10E6/uL (ref 3.77–5.28)
RDW: 14.8 % (ref 12.3–15.4)
WBC: 5.1 10*3/uL (ref 3.4–10.8)

## 2017-01-20 LAB — COMPREHENSIVE METABOLIC PANEL
A/G RATIO: 1.1 — AB (ref 1.2–2.2)
ALBUMIN: 4.2 g/dL (ref 3.5–4.8)
ALT: 16 IU/L (ref 0–32)
AST: 20 IU/L (ref 0–40)
Alkaline Phosphatase: 92 IU/L (ref 39–117)
BUN / CREAT RATIO: 11 — AB (ref 12–28)
BUN: 12 mg/dL (ref 8–27)
Bilirubin Total: 0.3 mg/dL (ref 0.0–1.2)
CO2: 26 mmol/L (ref 18–29)
Calcium: 9.9 mg/dL (ref 8.7–10.3)
Chloride: 99 mmol/L (ref 96–106)
Creatinine, Ser: 1.05 mg/dL — ABNORMAL HIGH (ref 0.57–1.00)
GFR, EST AFRICAN AMERICAN: 61 mL/min/{1.73_m2} (ref >59)
GFR, EST NON AFRICAN AMERICAN: 53 mL/min/{1.73_m2} — AB (ref >59)
Globulin, Total: 3.9 g/dL (ref 1.5–4.5)
Glucose: 127 mg/dL — ABNORMAL HIGH (ref 65–99)
POTASSIUM: 4.3 mmol/L (ref 3.5–5.2)
Sodium: 140 mmol/L (ref 134–144)
TOTAL PROTEIN: 8.1 g/dL (ref 6.0–8.5)

## 2017-01-20 LAB — LIPID PANEL
CHOL/HDL RATIO: 2.2 ratio (ref 0.0–4.4)
CHOLESTEROL TOTAL: 188 mg/dL (ref 100–199)
HDL: 84 mg/dL (ref >39)
LDL CALC: 90 mg/dL (ref 0–99)
TRIGLYCERIDES: 69 mg/dL (ref 0–149)
VLDL CHOLESTEROL CAL: 14 mg/dL (ref 5–40)

## 2017-01-20 LAB — HEMOGLOBIN A1C
Est. average glucose Bld gHb Est-mCnc: 128 mg/dL
Hgb A1c MFr Bld: 6.1 % — ABNORMAL HIGH (ref 4.8–5.6)

## 2017-01-20 LAB — TSH: TSH: 1.23 u[IU]/mL (ref 0.450–4.500)

## 2017-01-21 LAB — MICROALBUMIN / CREATININE URINE RATIO
CREATININE, UR: 90.1 mg/dL
Microalb/Creat Ratio: 4.8 mg/g creat (ref 0.0–30.0)
Microalbumin, Urine: 4.3 ug/mL

## 2017-03-05 ENCOUNTER — Ambulatory Visit
Admission: RE | Admit: 2017-03-05 | Discharge: 2017-03-05 | Disposition: A | Discharge status: Home / Self Care | Payer: Medicare Other | Source: Ambulatory Visit | Attending: Internal Medicine | Admitting: Internal Medicine

## 2017-03-05 DIAGNOSIS — N6323 Unspecified lump in the left breast, lower outer quadrant: Secondary | ICD-10-CM | POA: Diagnosis not present

## 2017-03-05 DIAGNOSIS — N6320 Unspecified lump in the left breast, unspecified quadrant: Secondary | ICD-10-CM | POA: Diagnosis not present

## 2017-03-05 DIAGNOSIS — R928 Other abnormal and inconclusive findings on diagnostic imaging of breast: Secondary | ICD-10-CM | POA: Insufficient documentation

## 2017-03-06 ENCOUNTER — Other Ambulatory Visit: Payer: Self-pay | Admitting: Internal Medicine

## 2017-03-06 DIAGNOSIS — R928 Other abnormal and inconclusive findings on diagnostic imaging of breast: Secondary | ICD-10-CM

## 2017-03-06 DIAGNOSIS — N6320 Unspecified lump in the left breast, unspecified quadrant: Secondary | ICD-10-CM

## 2017-03-10 HISTORY — PX: BREAST LUMPECTOMY: SHX2

## 2017-03-17 ENCOUNTER — Ambulatory Visit
Admission: RE | Admit: 2017-03-17 | Discharge: 2017-03-17 | Disposition: A | Discharge status: Home / Self Care | Payer: Medicare Other | Source: Ambulatory Visit | Attending: Internal Medicine | Admitting: Internal Medicine

## 2017-03-17 DIAGNOSIS — D4862 Neoplasm of uncertain behavior of left breast: Secondary | ICD-10-CM | POA: Diagnosis not present

## 2017-03-17 DIAGNOSIS — R928 Other abnormal and inconclusive findings on diagnostic imaging of breast: Secondary | ICD-10-CM | POA: Diagnosis not present

## 2017-03-17 DIAGNOSIS — N6323 Unspecified lump in the left breast, lower outer quadrant: Secondary | ICD-10-CM | POA: Diagnosis not present

## 2017-03-17 DIAGNOSIS — N6320 Unspecified lump in the left breast, unspecified quadrant: Secondary | ICD-10-CM

## 2017-03-18 LAB — SURGICAL PATHOLOGY

## 2017-04-01 ENCOUNTER — Ambulatory Visit (INDEPENDENT_AMBULATORY_CARE_PROVIDER_SITE_OTHER): Payer: Medicare Other | Admitting: Surgery

## 2017-04-01 ENCOUNTER — Encounter: Payer: Self-pay | Admitting: Surgery

## 2017-04-01 VITALS — BP 182/84 | HR 70 | Temp 98.6°F | Ht 68.0 in | Wt 143.4 lb

## 2017-04-01 DIAGNOSIS — N6012 Diffuse cystic mastopathy of left breast: Secondary | ICD-10-CM | POA: Diagnosis not present

## 2017-04-01 DIAGNOSIS — D0510 Intraductal carcinoma in situ of unspecified breast: Secondary | ICD-10-CM | POA: Insufficient documentation

## 2017-04-01 DIAGNOSIS — N6323 Unspecified lump in the left breast, lower outer quadrant: Secondary | ICD-10-CM

## 2017-04-01 NOTE — Patient Instructions (Signed)
Please look at your blue sheet in case your have any questions or concerns about your surgery.

## 2017-04-01 NOTE — Progress Notes (Signed)
Surgical Clinic History and Physical  Referring provider:  Glean Hess, MD [address] [place], [place] [address]  HISTORY OF PRESENT ILLNESS (HPI):  71 y.o. female presents for evaluation of abnormal Left breast diagnostic mammogram with focal breast ultrasound and associated core needle biopsy findings. Patient reports menses at age 23 years, no children or breastfeeding, no estrogen replacement therapy, no personal history of masses concerning for malignancy (radiographic or otherwise) requiring biopsy despite regular screening mammograms + diagnostic breast ultrasounds and multiple family history of breast cancer s/p remote hysterectomy during what was reportedly planned to be a partial hysterectomy/resection of a benign tumor (sounds like fibroids). Patient denies appreciating any palpable breast mass, weight loss, fever/chills, CP or SOB with climbing stairs and routinely extensive walking.  PAST MEDICAL HISTORY (PMH):  Past Medical History:  Diagnosis Date  . Diabetes mellitus without complication ([place])   . Edema    ankles  . GERD (gastroesophageal reflux disease)   . Hypertension      PAST SURGICAL HISTORY ([place]):  Past Surgical History:  Procedure Laterality Date  . ABDOMINAL HYSTERECTOMY  1980  . CATARACT EXTRACTION W/PHACO Right 06/12/2016   Procedure: CATARACT EXTRACTION PHACO AND INTRAOCULAR LENS PLACEMENT (IOC);  Surgeon: Eulogio Bear, MD;  Location: [facility] ORS;  Service: Ophthalmology;  Laterality: Right;  [place] 6.1 AP% 12.7 CDE .77 FLUID PACK LOT # [id number]  . COLONOSCOPY       MEDICATIONS:  Prior to Admission medications   Medication Sig Start Date End Date Taking? Authorizing Provider  ACCU-CHEK SOFTCLIX LANCETS lancets 1 each by Other route 2 (two) times daily. Use as instructed 09/23/16  Yes Glean Hess, MD  atorvastatin (LIPITOR) 10 MG tablet Take 1 tablet (10 mg total) by mouth daily. 09/26/16  Yes Glean Hess, MD  fluticasone  [facility]) 50 MCG/ACT nasal spray Place 2 sprays into both nostrils daily. 01/19/17  Yes Glean Hess, MD  glucose blood (ACCU-CHEK AVIVA PLUS) test strip Use to test blood sugar daily 01/19/17  Yes Glean Hess, MD  metoprolol succinate (TOPROL-XL) 25 MG 24 hr tablet Take 1 tablet (25 mg total) by mouth 2 (two) times daily. 09/23/16  Yes Glean Hess, MD  Multiple Vitamins-Calcium (ONE-A-DAY WOMENS PO) Take 1 tablet by mouth every other day.   Yes [provider]  omeprazole (PRILOSEC) 20 MG capsule Take 1 capsule (20 mg total) by mouth daily. 09/23/16  Yes Glean Hess, MD  aspirin EC 81 MG tablet Take 81 mg by mouth daily as needed (for sinus or pain).     [provider]     ALLERGIES:  Allergies  Allergen Reactions  . Ace Inhibitors Other (See Comments)    Other reaction(s): Cough  . Hydrochlorothiazide Other (See Comments)    Other reaction(s): Dizziness  . Triamterene-Hctz Other (See Comments)    Appetite loss     SOCIAL HISTORY:  Social History   Social History  . Marital status: Married    Spouse name: N/A  . Number of children: N/A  . Years of education: N/A   Occupational History  . Not on file.   Social History Main Topics  . Smoking status: Never Smoker  . Smokeless tobacco: Never Used  . Alcohol use No  . Drug use: No  . Sexual activity: Not on file   Other Topics Concern  . Not on file   Social History Narrative  . No narrative on file    The  patient currently resides (home / rehab facility / nursing home): Home  The patient normally is (ambulatory / bedbound): Ambulatory   FAMILY HISTORY:  Family History  Problem Relation Age of Onset  . Breast cancer Mother 28  . Diabetes Mother   . CAD Father   . Breast cancer Sister   . Breast cancer Maternal Aunt 50    Otherwise negative/non-contributory.  REVIEW OF SYSTEMS:  Constitutional: denies any other weight loss, fever, chills, or sweats  Eyes: denies any other  vision changes, history of eye injury  ENT: denies sore throat, hearing problems  Respiratory: denies shortness of breath, wheezing  Cardiovascular: denies chest pain, palpitations  Gastrointestinal: denies abdominal pain, N/V, or diarrhea Musculoskeletal: denies any other joint pains or cramps  Skin: Denies any other rashes or skin discolorations  Neurological: denies any other headache, dizziness, weakness  Psychiatric: Denies any other depression, anxiety   All other review of systems were otherwise negative   VITAL SIGNS:  @VSRANGES @     Height: 5\' 8"  (172.7 cm) Weight: 143 lb 6.4 oz (65 kg) BMI (Calculated): 21.8   PHYSICAL EXAM:  Constitutional:  -- Normal body habitus  -- Awake, alert, and oriented x3  Eyes:  -- Pupils equally round and reactive to light  -- No scleral icterus  Ear, nose, throat:  -- No jugular venous distension -- No nasal drainage, bleeding Breast: -- B/L NT with no palpable mass, nipple discharge, skin dimpling/changes, uniform symmetric submammary chest wall nodularity Pulmonary:  -- No crackles  -- Equal breath sounds bilaterally -- Breathing non-labored at rest Cardiovascular:  -- S1, S2 present  -- No pericardial rubs Gastrointestinal:  -- Abdomen soft, nontender, nondistended, no guarding/rebound  -- No abdominal masses appreciated, pulsatile or otherwise  Musculoskeletal and Integumentary:  -- Wounds or skin discoloration: None appreciated -- Extremities: B/L UE and LE FROM, hands and feet warm, no edema  Neurologic:  -- Motor function: Intact and symmetric -- Sensation: Intact and symmetric  Labs:  CBC:  Lab Results  Component Value Date   WBC 5.1 01/19/2017   WBC 5.8 02/14/2012   RBC 4.62 01/19/2017   RBC 4.53 02/14/2012   HEMOGLOBIN 13.5 01/19/2017   BMP:  Lab Results  Component Value Date   GLUCOSE 127 (H) 01/19/2017   GLUCOSE 103 (H) 02/22/2012   CO2 26 01/19/2017   CO2 27 02/22/2012   BUN 12 01/19/2017   BUN 13  02/22/2012   CREATININE 1.05 (H) 01/19/2017   CREATININE 1.11 02/22/2012   CALCIUM 9.9 01/19/2017   CALCIUM 9.3 02/22/2012     Imaging studies:  Left Breast Ultrasound-Guided Biopsy (03/17/2017) Addendum (03/24/2017) Pathology of the left breast biopsy revealed LEFT BREAST, 4:00; ULTRASOUND-GUIDED CORE BIOPSY: ATYPICAL SCLEROSING PAPILLARY LESION WITH SIGNET RING CELLS. Note: Excision is advised.  Left Breast Mammogram s/p Ultrasound-Guided Core Needle Biopsy (03/17/2017) Two-view mammography demonstrates presence of wing shaped tissue marker within the biopsy site, in expected radiographic position.  Left Breast Diagnostic Mammogram with Adjunct Ultrasound (03/05/2017) Long-term stability of mass in the 5:30 o'clock location of the left breast, compatible with benign lesion. Indeterminate 0.8 x 0.7 x 0.5  cm oval mass with irregular margins at the 4 o'clock location of the  left breast 1 cm from the nipple warranting tissue diagnosis.  No adenopathy identified by ultrasound.  Assessment/Plan:  71 y.o. 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   -  all risks, benefits, and alternatives to Left breast lumpectomy with image-guided needle-localization were discussed with the patient, all of her questions were answered to her expressed satisfaction, patient expresses she wishes to proceed, and informed consent was obtained accordingly  - will plan for outpatient Right breast lumpectomy with needle localization Tuesday, 5/29  - return to clinic 2 weeks after above planned procedure  All of the above recommendations were discussed with the patient, and all of patient's questions were answered to her expressed satisfaction.  Thank you for the opportunity to participate in this patient's care.  -- Marilynne Drivers Rosana Hoes, MD, [place]: [facility] - Partnering for exceptional care. Office: [phone]

## 2017-04-02 ENCOUNTER — Other Ambulatory Visit: Payer: Self-pay | Admitting: Surgery

## 2017-04-02 ENCOUNTER — Other Ambulatory Visit: Payer: Self-pay

## 2017-04-02 ENCOUNTER — Telehealth: Payer: Self-pay | Admitting: Surgery

## 2017-04-02 DIAGNOSIS — N6320 Unspecified lump in the left breast, unspecified quadrant: Secondary | ICD-10-CM

## 2017-04-02 NOTE — Telephone Encounter (Signed)
Pt advised of pre op date/time and sx date. Sx: 04/07/17 with Dr Audria Nine left lumpectomy with needle localization.  Pre op: 04/03/17 @ 9:45am.  Patient made aware to arrive at [facility] at 8:15am the morning of procedure.

## 2017-04-03 ENCOUNTER — Encounter
Admission: RE | Admit: 2017-04-03 | Discharge: 2017-04-03 | Disposition: A | Discharge status: Home / Self Care | Payer: Medicare Other | Source: Ambulatory Visit | Attending: Surgery | Admitting: Surgery

## 2017-04-03 ENCOUNTER — Ambulatory Visit
Admission: RE | Admit: 2017-04-03 | Discharge: 2017-04-03 | Disposition: A | Discharge status: Home / Self Care | Payer: Medicare Other | Source: Ambulatory Visit | Attending: Surgery | Admitting: Surgery

## 2017-04-03 DIAGNOSIS — Z01810 Encounter for preprocedural cardiovascular examination: Secondary | ICD-10-CM | POA: Insufficient documentation

## 2017-04-03 DIAGNOSIS — Z01812 Encounter for preprocedural laboratory examination: Secondary | ICD-10-CM | POA: Insufficient documentation

## 2017-04-03 DIAGNOSIS — Z01818 Encounter for other preprocedural examination: Secondary | ICD-10-CM | POA: Diagnosis not present

## 2017-04-03 DIAGNOSIS — N6320 Unspecified lump in the left breast, unspecified quadrant: Secondary | ICD-10-CM | POA: Diagnosis not present

## 2017-04-03 DIAGNOSIS — Z01811 Encounter for preprocedural respiratory examination: Secondary | ICD-10-CM

## 2017-04-03 DIAGNOSIS — N6323 Unspecified lump in the left breast, lower outer quadrant: Secondary | ICD-10-CM

## 2017-04-03 HISTORY — DX: Anemia, unspecified: D64.9

## 2017-04-03 LAB — CBC WITH DIFFERENTIAL/PLATELET
Basophils Absolute: 0.1 10*3/uL (ref 0–0.1)
Basophils Relative: 1 %
Eosinophils Absolute: 0.3 10*3/uL (ref 0–0.7)
Eosinophils Relative: 6 %
HCT: 36.7 % (ref 35.0–47.0)
HEMOGLOBIN: 12.3 g/dL (ref 12.0–16.0)
LYMPHS ABS: 0.9 10*3/uL — AB (ref 1.0–3.6)
Lymphocytes Relative: 17 %
MCH: 30 pg (ref 26.0–34.0)
MCHC: 33.4 g/dL (ref 32.0–36.0)
MCV: 89.7 fL (ref 80.0–100.0)
MONO ABS: 0.5 10*3/uL (ref 0.2–0.9)
MONOS PCT: 9 %
NEUTROS ABS: 3.4 10*3/uL (ref 1.4–6.5)
NEUTROS PCT: 67 %
Platelets: 297 10*3/uL (ref 150–440)
RBC: 4.09 MIL/uL (ref 3.80–5.20)
RDW: 13.9 % (ref 11.5–14.5)
WBC: 5 10*3/uL (ref 3.6–11.0)

## 2017-04-03 LAB — BASIC METABOLIC PANEL
ANION GAP: 7 (ref 5–15)
BUN: 10 mg/dL (ref 6–20)
CHLORIDE: 104 mmol/L (ref 101–111)
CO2: 26 mmol/L (ref 22–32)
Calcium: 9.1 mg/dL (ref 8.9–10.3)
Creatinine, Ser: 0.82 mg/dL (ref 0.44–1.00)
GFR calc non Af Amer: >60 mL/min (ref >60)
GLUCOSE: 93 mg/dL (ref 65–99)
Potassium: 4 mmol/L (ref 3.5–5.1)
Sodium: 137 mmol/L (ref 135–145)

## 2017-04-03 NOTE — Patient Instructions (Signed)
  Your procedure is scheduled on: Apr 07, 2017 (Tuesday) Report [place] ARRIVAL TIME 8:15 AM     Remember: Instructions that are not followed completely may result in serious medical risk, up to and including death, or upon the discretion of your surgeon and anesthesiologist your surgery may need to be rescheduled.    _x___ 1. Do not eat food or drink liquids after midnight. No gum chewing or hard candies                                 __x__ 2. No Alcohol for 24 hours before or after surgery.   __x__3. No Smoking for 24 prior to surgery.   ____  4. Bring all medications with you on the day of surgery if instructed.    __x__ 5. Notify your doctor if there is any change in your medical condition     (cold, fever, infections).     Do not wear jewelry, make-up, hairpins, clips or nail polish.  Do not wear lotions, powders, or perfumes.   Do not shave 48 hours prior to surgery. Men may shave face and neck.  Do not bring valuables to the hospital.    [facility] is not responsible for any belongings or valuables.               Contacts, dentures or bridgework may not be worn into surgery.  Leave your suitcase in the car. After surgery it may be brought to your room.  For patients admitted to the hospital, discharge time is determined by your  treatment team                       Patients discharged the day of surgery will not be allowed to drive home.  You will need someone to drive you home and stay with you the night of your procedure.    Please read over the following fact sheets that you were given:   [facility] Preparing for Surgery and or MRSA Information   _x___ Take the following medications the morning of surgery with a sip of water:  1. METOPROLOL  2. OMEPRAZOLE (OMEPRAZOLE AT BEDTIME  MONDAY  NIGHT MAY 28 )    3.  4.  5.  6.  ____Fleets enema or Magnesium Citrate as directed.   _x___ Use CHG Soap or sage wipes as directed on instruction sheet   ____  Use inhalers on the day of surgery and bring to hospital day of surgery  ____ Stop Metformin and Janumet 2 days prior to surgery.    ____ Take 1/2 of usual insulin dose the night before surgery and none on the morning  surgery    _x___ Follow recommendations from Cardiologist, Pulmonologist or PCP regarding          stopping Aspirin, Coumadin, Pllavix ,Eliquis, Effient, or Pradaxa, and Pletal. (PATIENT STATED SHE HAS STOPPED ASPIRIN ONE WEEK AGO)  X____Stop Anti-inflammatories such as Advil, Aleve, Ibuprofen, Motrin, Naproxen, Naprosyn, Goodies powders or aspirin products. OK to take Tylenol    _x___ Stop supplements until after surgery.  But may continue Vitamin D, Vitamin B, and multivitamin       ____ Bring C-Pap to the hospital.

## 2017-04-06 MED ORDER — CEFAZOLIN SODIUM-DEXTROSE 2-4 GM/100ML-% IV SOLN
2.0000 g | INTRAVENOUS | Status: AC
  Administered 2017-04-07: 2 g via INTRAVENOUS

## 2017-04-07 ENCOUNTER — Ambulatory Visit
Admission: RE | Admit: 2017-04-07 | Discharge: 2017-04-07 | Disposition: A | Discharge status: Home / Self Care | Payer: Medicare Other | Source: Ambulatory Visit | Attending: Surgery | Admitting: Surgery

## 2017-04-07 ENCOUNTER — Encounter: Payer: Self-pay | Admitting: *Deleted

## 2017-04-07 ENCOUNTER — Encounter
Admission: RE | Disposition: A | Discharge status: Home / Self Care | Payer: Self-pay | Source: Ambulatory Visit | Attending: Surgery

## 2017-04-07 ENCOUNTER — Ambulatory Visit: Payer: Medicare Other | Admitting: Anesthesiology

## 2017-04-07 DIAGNOSIS — D649 Anemia, unspecified: Secondary | ICD-10-CM | POA: Diagnosis not present

## 2017-04-07 DIAGNOSIS — N6032 Fibrosclerosis of left breast: Secondary | ICD-10-CM | POA: Insufficient documentation

## 2017-04-07 DIAGNOSIS — N6320 Unspecified lump in the left breast, unspecified quadrant: Secondary | ICD-10-CM | POA: Diagnosis not present

## 2017-04-07 DIAGNOSIS — I10 Essential (primary) hypertension: Secondary | ICD-10-CM | POA: Diagnosis not present

## 2017-04-07 DIAGNOSIS — D242 Benign neoplasm of left breast: Secondary | ICD-10-CM | POA: Insufficient documentation

## 2017-04-07 DIAGNOSIS — K219 Gastro-esophageal reflux disease without esophagitis: Secondary | ICD-10-CM | POA: Diagnosis not present

## 2017-04-07 DIAGNOSIS — Z803 Family history of malignant neoplasm of breast: Secondary | ICD-10-CM | POA: Diagnosis not present

## 2017-04-07 DIAGNOSIS — N6342 Unspecified lump in left breast, subareolar: Secondary | ICD-10-CM

## 2017-04-07 DIAGNOSIS — Z8249 Family history of ischemic heart disease and other diseases of the circulatory system: Secondary | ICD-10-CM | POA: Insufficient documentation

## 2017-04-07 DIAGNOSIS — K279 Peptic ulcer, site unspecified, unspecified as acute or chronic, without hemorrhage or perforation: Secondary | ICD-10-CM | POA: Insufficient documentation

## 2017-04-07 DIAGNOSIS — Z7982 Long term (current) use of aspirin: Secondary | ICD-10-CM | POA: Insufficient documentation

## 2017-04-07 DIAGNOSIS — Z888 Allergy status to other drugs, medicaments and biological substances status: Secondary | ICD-10-CM | POA: Diagnosis not present

## 2017-04-07 DIAGNOSIS — D0512 Intraductal carcinoma in situ of left breast: Secondary | ICD-10-CM | POA: Diagnosis not present

## 2017-04-07 DIAGNOSIS — Z79899 Other long term (current) drug therapy: Secondary | ICD-10-CM | POA: Diagnosis not present

## 2017-04-07 DIAGNOSIS — Z833 Family history of diabetes mellitus: Secondary | ICD-10-CM | POA: Insufficient documentation

## 2017-04-07 DIAGNOSIS — E119 Type 2 diabetes mellitus without complications: Secondary | ICD-10-CM | POA: Diagnosis not present

## 2017-04-07 DIAGNOSIS — D0502 Lobular carcinoma in situ of left breast: Secondary | ICD-10-CM | POA: Diagnosis not present

## 2017-04-07 DIAGNOSIS — Z7984 Long term (current) use of oral hypoglycemic drugs: Secondary | ICD-10-CM | POA: Insufficient documentation

## 2017-04-07 DIAGNOSIS — R928 Other abnormal and inconclusive findings on diagnostic imaging of breast: Secondary | ICD-10-CM | POA: Diagnosis not present

## 2017-04-07 DIAGNOSIS — Z90711 Acquired absence of uterus with remaining cervical stump: Secondary | ICD-10-CM | POA: Diagnosis not present

## 2017-04-07 HISTORY — PX: BREAST LUMPECTOMY WITH NEEDLE LOCALIZATION: SHX5759

## 2017-04-07 LAB — GLUCOSE, CAPILLARY
Glucose-Capillary: 108 mg/dL — ABNORMAL HIGH (ref 65–99)
Glucose-Capillary: 109 mg/dL — ABNORMAL HIGH (ref 65–99)

## 2017-04-07 SURGERY — BREAST LUMPECTOMY WITH NEEDLE LOCALIZATION
Anesthesia: General | Laterality: Left | Wound class: Clean

## 2017-04-07 MED ORDER — FENTANYL CITRATE (PF) 100 MCG/2ML IJ SOLN
25.0000 ug | INTRAMUSCULAR | Status: DC | PRN
  Administered 2017-04-07 (×2): 25 ug via INTRAVENOUS

## 2017-04-07 MED ORDER — CHLORHEXIDINE GLUCONATE CLOTH 2 % EX PADS: 6.0000 | MEDICATED_PAD | Freq: Once | CUTANEOUS | Status: DC

## 2017-04-07 MED ORDER — FENTANYL CITRATE (PF) 100 MCG/2ML IJ SOLN
INTRAMUSCULAR | Status: DC | PRN
  Administered 2017-04-07: 50 ug via INTRAVENOUS

## 2017-04-07 MED ORDER — SODIUM CHLORIDE 0.9 % IV SOLN
INTRAVENOUS | Status: DC
  Administered 2017-04-07: 10:00:00 via INTRAVENOUS

## 2017-04-07 MED ORDER — FENTANYL CITRATE (PF) 100 MCG/2ML IJ SOLN
INTRAMUSCULAR | Status: AC
  Filled 2017-04-07: qty 2

## 2017-04-07 MED ORDER — EPHEDRINE SULFATE 50 MG/ML IJ SOLN
INTRAMUSCULAR | Status: DC | PRN
  Administered 2017-04-07: 10 mg via INTRAVENOUS

## 2017-04-07 MED ORDER — LIDOCAINE HCL 1 % IJ SOLN
INTRAMUSCULAR | Status: DC | PRN
  Administered 2017-04-07: 5 mL via INTRAMUSCULAR

## 2017-04-07 MED ORDER — CEFAZOLIN SODIUM-DEXTROSE 2-4 GM/100ML-% IV SOLN
INTRAVENOUS | Status: AC
  Filled 2017-04-07: qty 100

## 2017-04-07 MED ORDER — LACTATED RINGERS IV SOLN
INTRAVENOUS | Status: DC | PRN
  Administered 2017-04-07: 12:00:00 via INTRAVENOUS

## 2017-04-07 MED ORDER — ONDANSETRON HCL 4 MG/2ML IJ SOLN: 4.0000 mg | Freq: Once | INTRAMUSCULAR | Status: DC | PRN

## 2017-04-07 MED ORDER — MIDAZOLAM HCL 2 MG/2ML IJ SOLN
INTRAMUSCULAR | Status: DC | PRN
  Administered 2017-04-07: 1 mg via INTRAVENOUS

## 2017-04-07 MED ORDER — BUPIVACAINE HCL (PF) 0.5 % IJ SOLN
INTRAMUSCULAR | Status: AC
  Filled 2017-04-07: qty 30

## 2017-04-07 MED ORDER — MIDAZOLAM HCL 2 MG/2ML IJ SOLN
INTRAMUSCULAR | Status: AC
  Filled 2017-04-07: qty 2

## 2017-04-07 MED ORDER — OXYCODONE-ACETAMINOPHEN 5-325 MG PO TABS: 1.0000 | ORAL_TABLET | Freq: Four times a day (QID) | ORAL | 0 refills | Status: DC | PRN

## 2017-04-07 MED ORDER — PROPOFOL 10 MG/ML IV BOLUS
INTRAVENOUS | Status: DC | PRN
  Administered 2017-04-07: 100 mg via INTRAVENOUS

## 2017-04-07 MED ORDER — ONDANSETRON HCL 4 MG/2ML IJ SOLN
INTRAMUSCULAR | Status: DC | PRN
  Administered 2017-04-07: 4 mg via INTRAVENOUS

## 2017-04-07 MED ORDER — DEXAMETHASONE SODIUM PHOSPHATE 10 MG/ML IJ SOLN
INTRAMUSCULAR | Status: DC | PRN
  Administered 2017-04-07: 5 mg via INTRAVENOUS

## 2017-04-07 MED ORDER — LIDOCAINE HCL (PF) 1 % IJ SOLN
INTRAMUSCULAR | Status: AC
  Filled 2017-04-07: qty 30

## 2017-04-07 SURGICAL SUPPLY — 32 items
BLADE SURG 15 STRL LF DISP TIS (BLADE) ×1 IMPLANT
BLADE SURG 15 STRL SS (BLADE) ×2
CANISTER SUCT 1200ML W/VALVE (MISCELLANEOUS) ×3 IMPLANT
CHLORAPREP W/TINT 26ML (MISCELLANEOUS) ×3 IMPLANT
COVER PROBE FLX POLY STRL (MISCELLANEOUS) IMPLANT
DERMABOND ADVANCED (GAUZE/BANDAGES/DRESSINGS) ×2
DERMABOND ADVANCED .7 DNX12 (GAUZE/BANDAGES/DRESSINGS) ×1 IMPLANT
DEVICE DUBIN SPECIMEN MAMMOGRA (MISCELLANEOUS) ×3 IMPLANT
DRAPE LAPAROTOMY TRNSV 106X77 (MISCELLANEOUS) ×3 IMPLANT
ELECT CAUTERY BLADE 6.4 (BLADE) ×3 IMPLANT
ELECT REM PT RETURN 9FT ADLT (ELECTROSURGICAL) ×3
ELECTRODE REM PT RTRN 9FT ADLT (ELECTROSURGICAL) ×1 IMPLANT
GLOVE BIO SURGEON STRL SZ7.5 (GLOVE) ×6 IMPLANT
GLOVE INDICATOR 8.0 STRL GRN (GLOVE) ×6 IMPLANT
GOWN STRL REUS W/ TWL LRG LVL3 (GOWN DISPOSABLE) ×2 IMPLANT
GOWN STRL REUS W/ TWL XL LVL3 (GOWN DISPOSABLE) IMPLANT
GOWN STRL REUS W/TWL LRG LVL3 (GOWN DISPOSABLE) ×4
GOWN STRL REUS W/TWL XL LVL3 (GOWN DISPOSABLE)
LABEL OR SOLS (LABEL) ×3 IMPLANT
MARGIN MAP 10MM (MISCELLANEOUS) ×3 IMPLANT
NEEDLE HYPO 25X1 1.5 SAFETY (NEEDLE) ×3 IMPLANT
PACK BASIN MINOR ARMC (MISCELLANEOUS) ×3 IMPLANT
SUT ETHILON 3-0 FS-10 30 BLK (SUTURE) ×3
SUT MNCRL 4-0 (SUTURE) ×2
SUT MNCRL 4-0 27XMFL (SUTURE) ×1
SUT SILK 2 0 SH (SUTURE) IMPLANT
SUT VIC AB 3-0 SH 27 (SUTURE) ×2
SUT VIC AB 3-0 SH 27X BRD (SUTURE) ×1 IMPLANT
SUTURE EHLN 3-0 FS-10 30 BLK (SUTURE) ×1 IMPLANT
SUTURE MNCRL 4-0 27XMF (SUTURE) ×1 IMPLANT
SYRINGE 10CC LL (SYRINGE) ×3 IMPLANT
WATER STERILE IRR 1000ML POUR (IV SOLUTION) ×3 IMPLANT

## 2017-04-07 NOTE — Discharge Instructions (Addendum)
In addition to included general post-operative instructions for Left breast lumpectomy (partial mastectomy),  Diet: Resume home heart healthy diet.   Activity: No heavy lifting >20 pounds (children, pets, laundry, garbage) or strenuous activity until follow-up, but light activity and walking are encouraged. Do not drive or drink alcohol if taking narcotic pain medications.  Wound care: 2 days after surgery (Thursday, 5/31), may shower/get incision wet with soapy water and pat dry (do not rub incisions), but no baths or submerging incision underwater until follow-up.   Medications: Resume all home medications. For mild to moderate pain: acetaminophen (Tylenol) or ibuprofen (if no kidney disease). Combining Tylenol with alcohol can substantially increase your risk of causing liver disease. Narcotic pain medications, if prescribed, can be used for severe pain, though may cause nausea, constipation, and drowsiness. Do not combine Tylenol and Percocet within a 6 hour period as Percocet contains Tylenol. If you do not need the narcotic pain medication, you do not need to fill the prescription.  Call office [phone]) at any time if any questions, worsening pain, fevers/chills, bleeding, drainage from incision site, or other concerns.  AMBULATORY SURGERY  DISCHARGE INSTRUCTIONS   1) The drugs that you were given will stay in your system until tomorrow so for the next 24 hours you should not:  A) Drive an automobile B) Make any legal decisions C) Drink any alcoholic beverage   2) You may resume regular meals tomorrow.  Today it is better to start with liquids and gradually work up to solid foods.  You may eat anything you prefer, but it is better to start with liquids, then soup and crackers, and gradually work up to solid foods.   3) Please notify your doctor immediately if you have any unusual bleeding, trouble breathing, redness and pain at the surgery site, drainage, fever, or pain not  relieved by medication.    4) Additional Instructions:        Please contact your physician with any problems or Same Day Surgery at [phone], Monday through Friday 6 am to 4 pm, or [place] at [facility] number at [phone].

## 2017-04-07 NOTE — Interval H&P Note (Signed)
History and Physical Interval Note:  04/07/2017 12:08 PM  Brandy Diaz  has presented today for surgery, with the diagnosis of BREAST MASS  The various methods of treatment have been discussed with the patient and family. After consideration of risks, benefits and other options for treatment, the patient has consented to  Procedure(s): BREAST LUMPECTOMY WITH NEEDLE LOCALIZATION (Left) as a surgical intervention .  The patient's history has been reviewed, patient examined, no change in status, stable for surgery.  I have reviewed the patient's chart and labs.  Questions were answered to the patient's satisfaction.     Vickie Epley

## 2017-04-07 NOTE — Op Note (Signed)
SURGICAL OPERATIVE REPORT  DATE OF PROCEDURE: 04/07/2017  ATTENDING Surgeon(s): Vickie Epley, MD  ANESTHESIA: general   PRE-OPERATIVE DIAGNOSIS: Left breast atypical sclerosing papillary lesion with signet ring cells (icd-10: N63.42)  POST-OPERATIVE DIAGNOSIS: Left breast atypical sclerosing papillary lesion with signet ring cells (icd-10: N63.42)  PROCEDURE(S):  1.) Left partial mastectomy/lumpectomy with image-guided wire localization (cpt: 59163)  INTRAOPERATIVE FINDINGS: Wire-localized mammary tissue removed in its entirety (no palpable mass appreciated) including biopsy-associated winged clip, confirmed intra-operatively by radiology  INTRAVENOUS FLUIDS: 1000 mL crystalloid   ESTIMATED BLOOD LOSS: Minimal (<20 mL)  URINE OUTPUT: No Foley   SPECIMENS: Left breast lumpectomy  IMPLANTS: None  DRAINS: none  COMPLICATIONS: None apparent  CONDITION AT END OF PROCEDURE: Hemodynamically stable and extubated  DISPOSITION OF PATIENT: PACU  INDICATIONS FOR PROCEDURE:  71 y.o. female presented for evaluation of abnormal Left breast diagnostic mammogram with focal breast ultrasound and abnormal core needle biopsy findings, for which excisional biopsy was advised in the context of multiple family history of breast carcinoma. All risks, benefits, and alternatives to partial mastectomy were discussed with the patient, all of patient's questions were answered to her expressed satisfaction, and informed consent was obtained and documented.  DETAILS OF PROCEDURE: Pre-operative needle-/wire-localization was performed by radiology, and localization studies were reviewed. Patient was brought to the operating suite and appropriately identified. General anesthesia was administered along with appropriate pre-operative antibiotics, and endotracheal intubation was performed by anesthetist. In supine position, operative site was prepped and draped in the usual sterile fashion, and following a  brief time out, an upper circumareolar incision was made using a #15 blade scalpel starting with incorporation of the wire entry site and extending 3 cm laterally, around which appropriate thickness skin flaps were created and excision was continued deep to chest wall to include the localization wire/needle in its entirety with appropriately wide margins. Long lateral suture and short superior suture were used to orient the specimen, which was then handed off the field for radiographic assessment and pathology processing. Hemostasis was confirmed, and wound was then re-approximated in layers using buried interrupted 3-0 Vicryl for dermis and 4-0 Monocryl running subcuticular suture to re-approximate epidermis. Radiographic confirmation that the entire lesion was excised -- including the localizing wire and clip -- was received prior to completion of closure. Skin was then cleaned and dried, and sterile skin glue was applied and allowed to dry.   Patient was then safely able to be extubated, awakened, and transferred to PACU for post-operative monitoring and care.  I was present for all aspects of the above procedure, and there were no complications apparent.

## 2017-04-07 NOTE — Anesthesia Procedure Notes (Signed)
Procedure Name: LMA Insertion Date/Time: 04/07/2017 12:20 PM Performed by: Jennette Bill Pre-anesthesia Checklist: Patient identified, Patient being monitored, Timeout performed, Emergency Drugs available and Suction available Patient Re-evaluated:Patient Re-evaluated prior to inductionOxygen Delivery Method: Circle system utilized Preoxygenation: Pre-oxygenation with 100% oxygen Intubation Type: IV induction Ventilation: Mask ventilation without difficulty LMA: LMA inserted Tube type: Oral Tube size: 3.0 mm Number of attempts: 1 Placement Confirmation: positive ETCO2 and breath sounds checked- equal and bilateral Tube secured with: Tape Dental Injury: Teeth and Oropharynx as per pre-operative assessment

## 2017-04-07 NOTE — Anesthesia Preprocedure Evaluation (Signed)
Anesthesia Evaluation  Patient identified by MRN, date of birth, ID band Patient awake    Reviewed: Allergy & Precautions, NPO status , Patient's Chart, lab work & pertinent test results, reviewed documented beta blocker date and time   History of Anesthesia Complications Negative for: history of anesthetic complications  Airway Mallampati: III     Mouth opening: Limited Mouth Opening  Dental  (+) Missing   Pulmonary neg pulmonary ROS,           Cardiovascular hypertension, Pt. on medications and Pt. on home beta blockers      Neuro/Psych negative neurological ROS     GI/Hepatic Neg liver ROS, PUD, GERD  Medicated and Controlled,  Endo/Other  diabetes, Type 2, Oral Hypoglycemic Agents  Renal/GU Renal InsufficiencyRenal disease     Musculoskeletal   Abdominal   Peds  Hematology  (+) anemia ,   Anesthesia Other Findings   Reproductive/Obstetrics                             Anesthesia Physical Anesthesia Plan  ASA: III  Anesthesia Plan: General   Post-op Pain Management:    Induction: Intravenous  Airway Management Planned: LMA  Additional Equipment:   Intra-op Plan:   Post-operative Plan:   Informed Consent: I have reviewed the patients History and Physical, chart, labs and discussed the procedure including the risks, benefits and alternatives for the proposed anesthesia with the patient or authorized representative who has indicated his/her understanding and acceptance.     Plan Discussed with:   Anesthesia Plan Comments:         Anesthesia Quick Evaluation

## 2017-04-07 NOTE — H&P (View-Only) (Signed)
Surgical Clinic History and Physical  Referring provider:  Glean Hess, MD [address] [place], [place] [address]  HISTORY OF PRESENT ILLNESS (HPI):  71 y.o. female presents for evaluation of abnormal Left breast diagnostic mammogram with focal breast ultrasound and associated core needle biopsy findings. Patient reports menses at age 71 years, no children or breastfeeding, no estrogen replacement therapy, no personal history of masses concerning for malignancy (radiographic or otherwise) requiring biopsy despite regular screening mammograms + diagnostic breast ultrasounds and multiple family history of breast cancer s/p remote hysterectomy during what was reportedly planned to be a partial hysterectomy/resection of a benign tumor (sounds like fibroids). Patient denies appreciating any palpable breast mass, weight loss, fever/chills, CP or SOB with climbing stairs and routinely extensive walking.  PAST MEDICAL HISTORY (PMH):  Past Medical History:  Diagnosis Date  . Diabetes mellitus without complication ([place])   . Edema    ankles  . GERD (gastroesophageal reflux disease)   . Hypertension      PAST SURGICAL HISTORY ([place]):  Past Surgical History:  Procedure Laterality Date  . ABDOMINAL HYSTERECTOMY  1980  . CATARACT EXTRACTION W/PHACO Right 06/12/2016   Procedure: CATARACT EXTRACTION PHACO AND INTRAOCULAR LENS PLACEMENT (IOC);  Surgeon: Eulogio Bear, MD;  Location: [facility] ORS;  Service: Ophthalmology;  Laterality: Right;  [place] 6.1 AP% 12.7 CDE .77 FLUID PACK LOT # [id number]  . COLONOSCOPY       MEDICATIONS:  Prior to Admission medications   Medication Sig Start Date End Date Taking? Authorizing Provider  ACCU-CHEK SOFTCLIX LANCETS lancets 1 each by Other route 2 (two) times daily. Use as instructed 09/23/16  Yes Glean Hess, MD  atorvastatin (LIPITOR) 10 MG tablet Take 1 tablet (10 mg total) by mouth daily. 09/26/16  Yes Glean Hess, MD  fluticasone  [facility]) 50 MCG/ACT nasal spray Place 2 sprays into both nostrils daily. 01/19/17  Yes Glean Hess, MD  glucose blood (ACCU-CHEK AVIVA PLUS) test strip Use to test blood sugar daily 01/19/17  Yes Glean Hess, MD  metoprolol succinate (TOPROL-XL) 25 MG 24 hr tablet Take 1 tablet (25 mg total) by mouth 2 (two) times daily. 09/23/16  Yes Glean Hess, MD  Multiple Vitamins-Calcium (ONE-A-DAY WOMENS PO) Take 1 tablet by mouth every other day.   Yes [provider]  omeprazole (PRILOSEC) 20 MG capsule Take 1 capsule (20 mg total) by mouth daily. 09/23/16  Yes Glean Hess, MD  aspirin EC 81 MG tablet Take 81 mg by mouth daily as needed (for sinus or pain).     [provider]     ALLERGIES:  Allergies  Allergen Reactions  . Ace Inhibitors Other (See Comments)    Other reaction(s): Cough  . Hydrochlorothiazide Other (See Comments)    Other reaction(s): Dizziness  . Triamterene-Hctz Other (See Comments)    Appetite loss     SOCIAL HISTORY:  Social History   Social History  . Marital status: Married    Spouse name: N/A  . Number of children: N/A  . Years of education: N/A   Occupational History  . Not on file.   Social History Main Topics  . Smoking status: Never Smoker  . Smokeless tobacco: Never Used  . Alcohol use No  . Drug use: No  . Sexual activity: Not on file   Other Topics Concern  . Not on file   Social History Narrative  . No narrative on file    The  patient currently resides (home / rehab facility / nursing home): Home  The patient normally is (ambulatory / bedbound): Ambulatory   FAMILY HISTORY:  Family History  Problem Relation Age of Onset  . Breast cancer Mother 17  . Diabetes Mother   . CAD Father   . Breast cancer Sister   . Breast cancer Maternal Aunt 50    Otherwise negative/non-contributory.  REVIEW OF SYSTEMS:  Constitutional: denies any other weight loss, fever, chills, or sweats  Eyes: denies any other  vision changes, history of eye injury  ENT: denies sore throat, hearing problems  Respiratory: denies shortness of breath, wheezing  Cardiovascular: denies chest pain, palpitations  Gastrointestinal: denies abdominal pain, N/V, or diarrhea Musculoskeletal: denies any other joint pains or cramps  Skin: Denies any other rashes or skin discolorations  Neurological: denies any other headache, dizziness, weakness  Psychiatric: Denies any other depression, anxiety   All other review of systems were otherwise negative   VITAL SIGNS:  @VSRANGES @     Height: 5\' 8"  (172.7 cm) Weight: 143 lb 6.4 oz (65 kg) BMI (Calculated): 21.8   PHYSICAL EXAM:  Constitutional:  -- Normal body habitus  -- Awake, alert, and oriented x3  Eyes:  -- Pupils equally round and reactive to light  -- No scleral icterus  Ear, nose, throat:  -- No jugular venous distension -- No nasal drainage, bleeding Breast: -- B/L NT with no palpable mass, nipple discharge, skin dimpling/changes, uniform symmetric submammary chest wall nodularity Pulmonary:  -- No crackles  -- Equal breath sounds bilaterally -- Breathing non-labored at rest Cardiovascular:  -- S1, S2 present  -- No pericardial rubs Gastrointestinal:  -- Abdomen soft, nontender, nondistended, no guarding/rebound  -- No abdominal masses appreciated, pulsatile or otherwise  Musculoskeletal and Integumentary:  -- Wounds or skin discoloration: None appreciated -- Extremities: B/L UE and LE FROM, hands and feet warm, no edema  Neurologic:  -- Motor function: Intact and symmetric -- Sensation: Intact and symmetric  Labs:  CBC:  Lab Results  Component Value Date   WBC 5.1 01/19/2017   WBC 5.8 02/14/2012   RBC 4.62 01/19/2017   RBC 4.53 02/14/2012   HEMOGLOBIN 13.5 01/19/2017   BMP:  Lab Results  Component Value Date   GLUCOSE 127 (H) 01/19/2017   GLUCOSE 103 (H) 02/22/2012   CO2 26 01/19/2017   CO2 27 02/22/2012   BUN 12 01/19/2017   BUN 13  02/22/2012   CREATININE 1.05 (H) 01/19/2017   CREATININE 1.11 02/22/2012   CALCIUM 9.9 01/19/2017   CALCIUM 9.3 02/22/2012     Imaging studies:  Left Breast Ultrasound-Guided Biopsy (03/17/2017) Addendum (03/24/2017) Pathology of the left breast biopsy revealed LEFT BREAST, 4:00; ULTRASOUND-GUIDED CORE BIOPSY: ATYPICAL SCLEROSING PAPILLARY LESION WITH SIGNET RING CELLS. Note: Excision is advised.  Left Breast Mammogram s/p Ultrasound-Guided Core Needle Biopsy (03/17/2017) Two-view mammography demonstrates presence of wing shaped tissue marker within the biopsy site, in expected radiographic position.  Left Breast Diagnostic Mammogram with Adjunct Ultrasound (03/05/2017) Long-term stability of mass in the 5:30 o'clock location of the left breast, compatible with benign lesion. Indeterminate 0.8 x 0.7 x 0.5  cm oval mass with irregular margins at the 4 o'clock location of the  left breast 1 cm from the nipple warranting tissue diagnosis.  No adenopathy identified by ultrasound.  Assessment/Plan:  71 y.o. 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   -  all risks, benefits, and alternatives to Left breast lumpectomy with image-guided needle-localization were discussed with the patient, all of her questions were answered to her expressed satisfaction, patient expresses she wishes to proceed, and informed consent was obtained accordingly  - will plan for outpatient Right breast lumpectomy with needle localization Tuesday, 5/29  - return to clinic 2 weeks after above planned procedure  All of the above recommendations were discussed with the patient, and all of patient's questions were answered to her expressed satisfaction.  Thank you for the opportunity to participate in this patient's care.  -- Marilynne Drivers Rosana Hoes, MD, [place]: [facility] - Partnering for exceptional care. Office: [phone]

## 2017-04-07 NOTE — Anesthesia Post-op Follow-up Note (Cosign Needed)
Anesthesia QCDR form completed.        

## 2017-04-07 NOTE — Transfer of Care (Signed)
Immediate Anesthesia Transfer of Care Note  Patient: Brandy Diaz  Procedure(s) Performed: Procedure(s): BREAST LUMPECTOMY WITH NEEDLE LOCALIZATION (Left)  Patient Location: PACU  Anesthesia Type:General  Level of Consciousness: awake, alert  and oriented  Airway & Oxygen Therapy: Patient Spontanous Breathing and Patient connected to face mask oxygen  Post-op Assessment: Report given to RN and Post -op Vital signs reviewed and stable  Post vital signs: Reviewed and stable  Last Vitals:  Vitals:   04/07/17 1000 04/07/17 1334  BP: (!) 154/91 (!) 149/73  Pulse: 82 71  Resp: 14 14  Temp:  36.2 C    Last Pain:  Vitals:   04/07/17 1334  TempSrc:   PainSc: Asleep      Patients Stated Pain Goal: 2 (79/48/01 6553)  Complications: No apparent anesthesia complications

## 2017-04-07 NOTE — Progress Notes (Signed)
Local injection at 9:58

## 2017-04-08 ENCOUNTER — Encounter: Payer: Self-pay | Admitting: Surgery

## 2017-04-08 NOTE — Anesthesia Postprocedure Evaluation (Signed)
Anesthesia Post Note  Patient: ALETA MANTERNACH  Procedure(s) Performed: Procedure(s) (LRB): BREAST LUMPECTOMY WITH NEEDLE LOCALIZATION (Left)  Patient location during evaluation: PACU Anesthesia Type: General Level of consciousness: awake Pain management: pain level controlled Vital Signs Assessment: post-procedure vital signs reviewed and stable Respiratory status: spontaneous breathing Cardiovascular status: stable Anesthetic complications: no     Last Vitals:  Vitals:   04/07/17 1415 04/07/17 1440  BP: (!) 186/72 (!) 182/96  Pulse: (!) 59 68  Resp: 16   Temp: 36.2 C     Last Pain:  Vitals:   04/07/17 1415  TempSrc: Temporal  PainSc: 0-No pain                 VAN STAVEREN,Malkia Nippert

## 2017-04-10 LAB — SURGICAL PATHOLOGY

## 2017-04-13 ENCOUNTER — Telehealth: Payer: Self-pay

## 2017-04-13 NOTE — Telephone Encounter (Signed)
Call made to patient at this time. I explained that pathology is now available and Dr. Rosana Hoes would like to see her in office this week to review pathology and plan going forward. She verbalizes understanding. Patient was placed on schedule for Thursday afternoon per patient preference.   Referral has been sent to Oncology and will give patient appointment to see oncologist on Thursday when she is here in office.  MD made aware.

## 2017-04-14 ENCOUNTER — Other Ambulatory Visit: Payer: Self-pay | Admitting: *Deleted

## 2017-04-15 ENCOUNTER — Ambulatory Visit: Payer: Medicare Other | Admitting: Oncology

## 2017-04-16 ENCOUNTER — Encounter: Payer: Self-pay | Admitting: Surgery

## 2017-04-16 ENCOUNTER — Ambulatory Visit (INDEPENDENT_AMBULATORY_CARE_PROVIDER_SITE_OTHER): Payer: Medicare Other | Admitting: Surgery

## 2017-04-16 VITALS — BP 184/79 | HR 73 | Temp 99.1°F | Ht 68.0 in | Wt 142.4 lb

## 2017-04-16 DIAGNOSIS — D0512 Intraductal carcinoma in situ of left breast: Secondary | ICD-10-CM

## 2017-04-16 NOTE — Patient Instructions (Addendum)
We will have you follow-up with Dr. Janese Banks as scheduled below. She is the Oncologist Microbiologist) that will speak with you in regards to any further treatment and decided if this is needed.  We will see you back in 6 months with a mammogram and ultrasound prior to your appointment. We will call you when it is time to get this scheduled.  Please call our office if you have any further questions or concerns.

## 2017-04-17 ENCOUNTER — Telehealth: Payer: Self-pay

## 2017-04-17 NOTE — Telephone Encounter (Signed)
[name] from the [facility] called wanting to know if patient had been told of her diagnoses prior to giving her a phone call due to not seeing a note in patients chart. I informed Lelon Frohlich that I would call her back once I received this information.

## 2017-04-17 NOTE — Progress Notes (Signed)
Surgical Clinic Progress/Follow-up Note   HPI:  71 y.o. Female presents to clinic for post-op follow-up evaluation s/p Left breast lumpectomy for atypical sclerosing papillary lesion with signet cells and to discuss pathology results. Patient reports minimal pain easily controlled and denies fever/chills, CP, or SOB. She does admit to being anxious regarding pathology results.  Review of Systems:  Constitutional: denies any other weight loss, fever, chills, or sweats  Eyes: denies any other vision changes, history of eye injury  ENT: denies sore throat, hearing problems  Respiratory: denies shortness of breath, wheezing  Cardiovascular: denies chest pain, palpitations  Gastrointestinal: denies abdominal pain, N/V, or diarrhea Musculoskeletal: denies any other joint pains or cramps  Skin: Denies any other rashes or skin discolorations  Neurological: denies any other headache, dizziness, weakness  Psychiatric: denies any other depression, anxiety  All other review of systems: otherwise negative   Vital Signs:  BP (!) 184/79   Pulse 73   Temp 99.1 F (37.3 C) (Oral)   Ht 5\' 8"  (1.727 m)   Wt 142 lb 6.4 oz (64.6 kg)   BMI 21.65 kg/m    Physical Exam:  Constitutional:  -- Normal body habitus  -- Awake, alert, and oriented x3  Eyes:  -- Pupils equally round and reactive to light  -- No scleral icterus  Ear, nose, throat:  -- No jugular venous distension  -- No nasal drainage, bleeding Breast: -- Left breast upper circumareolar incision healing well without erythema or drainage -- No palpable Left breast masses or nipple discharge appreciated, very little asymmetric despite large biopsy from small-medium-sized breast -- No palpable Right breast masses or nipple discharge appreciated Pulmonary:  -- No crackles  -- No dullness to percussion  Cardiovascular:  -- S1, S2 present  -- No pericardial rubs  Gastrointestinal:  -- Soft, nontender, nondistended, no guarding/rebound   -- No abdominal masses appreciated, pulsatile or otherwise  Musculoskeletal / Integumentary:  -- Wounds or skin discoloration: None except as per Breast (above)  -- Extremities: B/L UE and LE FROM, hands and feet warm, no edema  Neurologic:  -- Motor function: intact and symmetric  -- Sensation: intact and symmetric   Surgical Pathology (04/07/2017) DUCTAL CARCINOMA IN SITU OF THE BREAST Procedure: Needle localized lumpectomy  Specimen Laterality: Left  Size (Extent) of DCIS: 8 mm  Histologic Type: Mixed ductal and lobular carcinoma in situ (DCIS)  Nuclear Grade: Intermediate, grade 2  Necrosis: Not identified  Margins: Negative for DCIS  Distance from closest margin: 8 mm from deep margin  Regional Lymph nodes: Lymph nodes not submitted or found  Pathologic Stage Classification (pTNM, AJCC 8th Edition): pTis pNx   Estrogen Receptor (ER) Status: Positive, range 51-90 percent nuclear staining  Progesterone Receptor (PgR) Status: Negative, internal controls present and stain as expected  Assessment:  71 y.o. yo Female with a problem list including...  Patient Active Problem List   Diagnosis Date Noted  . Subareolar mass of left breast 04/01/2017  . Essential (primary) hypertension 07/24/2015  . Gastro-esophageal reflux disease without esophagitis 07/24/2015  . Hyperlipidemia associated with type 2 diabetes mellitus ([place]) 07/24/2015  . Type 2 diabetes mellitus with stage 3 chronic kidney disease, without long-term current use of insulin ([place]) 07/24/2015  . Unexplained weight loss 07/24/2015  . Fibrocystic breast changes 07/24/2015    presents to clinic, doing well with pathology demonstrating mixed DCIS and LCIS s/p Left breast wire-localized partial mastectomy/lumpectomy for atypical sclerosing papillary lesion with signet cells.  Plan:   -  referral made to medical oncology  - follow-up next diagnostic mammogram with focal ultrasound in 6 months  - pathology results  discussed with patient, and all of her questions were answered  - no further surgical intervention indicated at this time, follow-up in 6 months  - patient advised and encouraged to call if any questions  All of the above recommendations were discussed with the patient, and all of patient's questions were answered to her expressed satisfaction.  -- Marilynne Drivers Rosana Hoes, MD, [place]: [facility] - Partnering for exceptional care. Office: [phone]

## 2017-04-20 ENCOUNTER — Telehealth: Payer: Self-pay

## 2017-04-20 NOTE — Telephone Encounter (Signed)
I was to return [name]'s phone call on Friday. Was unable to do so on Friday  due to not hearing from Dr. Rosana Hoes until after office ours. She had originally called wanting to know if he had informed patient of her pathology results prior to giving her a call. Left a message letting her know that Dr. Rosana Hoes' note is in the patients chart and that she could call me back if needed.

## 2017-04-23 ENCOUNTER — Encounter: Payer: Medicare Other | Admitting: Surgery

## 2017-04-24 ENCOUNTER — Other Ambulatory Visit: Payer: Self-pay

## 2017-04-24 ENCOUNTER — Ambulatory Visit
Admission: RE | Admit: 2017-04-24 | Discharge: 2017-04-24 | Disposition: A | Discharge status: Home / Self Care | Payer: Medicare Other | Source: Ambulatory Visit | Attending: Oncology | Admitting: Oncology

## 2017-04-24 ENCOUNTER — Inpatient Hospital Stay: Payer: Medicare Other

## 2017-04-24 ENCOUNTER — Inpatient Hospital Stay: Payer: Medicare Other | Attending: Oncology | Admitting: Oncology

## 2017-04-24 ENCOUNTER — Encounter: Payer: Self-pay | Admitting: Oncology

## 2017-04-24 VITALS — BP 184/87 | HR 74 | Temp 98.1°F | Resp 18 | Ht 66.14 in | Wt 144.6 lb

## 2017-04-24 DIAGNOSIS — K219 Gastro-esophageal reflux disease without esophagitis: Secondary | ICD-10-CM | POA: Diagnosis not present

## 2017-04-24 DIAGNOSIS — Z170 Estrogen receptor positive status [ER+]: Secondary | ICD-10-CM

## 2017-04-24 DIAGNOSIS — E785 Hyperlipidemia, unspecified: Secondary | ICD-10-CM | POA: Diagnosis not present

## 2017-04-24 DIAGNOSIS — R634 Abnormal weight loss: Secondary | ICD-10-CM | POA: Diagnosis not present

## 2017-04-24 DIAGNOSIS — C50912 Malignant neoplasm of unspecified site of left female breast: Secondary | ICD-10-CM

## 2017-04-24 DIAGNOSIS — N183 Chronic kidney disease, stage 3 (moderate): Secondary | ICD-10-CM

## 2017-04-24 DIAGNOSIS — I129 Hypertensive chronic kidney disease with stage 1 through stage 4 chronic kidney disease, or unspecified chronic kidney disease: Secondary | ICD-10-CM | POA: Insufficient documentation

## 2017-04-24 DIAGNOSIS — Z7982 Long term (current) use of aspirin: Secondary | ICD-10-CM

## 2017-04-24 DIAGNOSIS — Z800 Family history of malignant neoplasm of digestive organs: Secondary | ICD-10-CM | POA: Diagnosis not present

## 2017-04-24 DIAGNOSIS — Z803 Family history of malignant neoplasm of breast: Secondary | ICD-10-CM

## 2017-04-24 DIAGNOSIS — M7989 Other specified soft tissue disorders: Secondary | ICD-10-CM | POA: Diagnosis not present

## 2017-04-24 DIAGNOSIS — E1122 Type 2 diabetes mellitus with diabetic chronic kidney disease: Secondary | ICD-10-CM | POA: Diagnosis not present

## 2017-04-24 DIAGNOSIS — D0512 Intraductal carcinoma in situ of left breast: Secondary | ICD-10-CM | POA: Diagnosis not present

## 2017-04-24 DIAGNOSIS — R600 Localized edema: Secondary | ICD-10-CM

## 2017-04-24 DIAGNOSIS — Z79899 Other long term (current) drug therapy: Secondary | ICD-10-CM | POA: Diagnosis not present

## 2017-04-24 LAB — COMPREHENSIVE METABOLIC PANEL
ALT: 12 U/L — AB (ref 14–54)
AST: 20 U/L (ref 15–41)
Albumin: 4 g/dL (ref 3.5–5.0)
Alkaline Phosphatase: 82 U/L (ref 38–126)
Anion gap: 8 (ref 5–15)
BUN: 10 mg/dL (ref 6–20)
CHLORIDE: 103 mmol/L (ref 101–111)
CO2: 28 mmol/L (ref 22–32)
CREATININE: 1 mg/dL (ref 0.44–1.00)
Calcium: 9.6 mg/dL (ref 8.9–10.3)
GFR calc non Af Amer: 55 mL/min — ABNORMAL LOW (ref >60)
Glucose, Bld: 104 mg/dL — ABNORMAL HIGH (ref 65–99)
Potassium: 3.8 mmol/L (ref 3.5–5.1)
SODIUM: 139 mmol/L (ref 135–145)
Total Bilirubin: 0.5 mg/dL (ref 0.3–1.2)
Total Protein: 8.3 g/dL — ABNORMAL HIGH (ref 6.5–8.1)

## 2017-04-24 LAB — CBC WITH DIFFERENTIAL/PLATELET
BASOS ABS: 0.1 10*3/uL (ref 0–0.1)
BASOS PCT: 1 %
EOS ABS: 0.2 10*3/uL (ref 0–0.7)
EOS PCT: 6 %
HCT: 38.4 % (ref 35.0–47.0)
Hemoglobin: 13 g/dL (ref 12.0–16.0)
LYMPHS ABS: 1.1 10*3/uL (ref 1.0–3.6)
Lymphocytes Relative: 26 %
MCH: 29.9 pg (ref 26.0–34.0)
MCHC: 33.9 g/dL (ref 32.0–36.0)
MCV: 88.2 fL (ref 80.0–100.0)
Monocytes Absolute: 0.4 10*3/uL (ref 0.2–0.9)
Monocytes Relative: 9 %
Neutro Abs: 2.5 10*3/uL (ref 1.4–6.5)
Neutrophils Relative %: 58 %
PLATELETS: 347 10*3/uL (ref 150–440)
RBC: 4.35 MIL/uL (ref 3.80–5.20)
RDW: 13.8 % (ref 11.5–14.5)
WBC: 4.2 10*3/uL (ref 3.6–11.0)

## 2017-04-24 NOTE — Progress Notes (Signed)
New pt evaluation. Pt noted to have 1 plus pitting edema of R lower leg/ankle. Noted to have 3 plus pitting edema of left lower leg/ankle. Encouraged pt to elevate legs. Pt stated her ankles have been swollen to this degree x7 day. Encouraged to see Dr Joylene Grapes PCP. Stated she she would f/u/ Pleasant patient. In NAD w leg/foot  edema

## 2017-04-24 NOTE — Progress Notes (Signed)
Hematology/Oncology Consult note [facility] Telephone:([phone] [phone]  Patient Care Team: Glean Hess, MD as PCP - General (Family Medicine)   Name of the patient: Brandy Diaz  [id number]  1946/09/17    Reason for referral- DCIS left breast ER+   Referring physician- Dr. Rosana Hoes  Date of visit: 04/24/17   History of presenting illness- 1. Patient is a 71 year old female with a past medical history significant for hypertension and diet-controlled diabetes she recently underwent screening mammogram which showed an abnormal mass in the left breast 3 cm from the nipple 1 x 0.9 x 0.3 cm in size and mobile mass at 4:00 position 0.8 x 0.7 x 0.5 cm in size. Evaluation of axilla was negative for adenopathy. This was followed by ultrasound and the mass at the 5:30 position was stable long-term and consistent with benign lesion and core biopsy of the 4:00 position mass was recommended.  2. 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   3. 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 No necrosis was identified. Margins were negative for DCIS 8 mm from the deep margin. ER 5190% positive PR negative  4. Patient lives with her husband and is independent of her ADLs and IADLs. Menarche at 45 years. She does not have any children. She never breast-fed and was never on hormone replacement therapy. She has a remote history of hysterectomy probably secondary to fibroids. She has been having on and off leg swelling but it has been particularly worse over the last 1 week and her swelling has not subsided. Denies any shortness of breath  ECOG PS- 1  Pain scale- 0   Review of systems- Review of Systems  Constitutional: Negative for chills, fever, malaise/fatigue and weight loss.  HENT: Negative for congestion,  ear discharge and nosebleeds.   Eyes: Negative for blurred vision.  Respiratory: Negative for cough, hemoptysis, sputum production, shortness of breath and wheezing.   Cardiovascular: Positive for leg swelling. Negative for chest pain, palpitations, orthopnea and claudication.  Gastrointestinal: Negative for abdominal pain, blood in stool, constipation, diarrhea, heartburn, melena, nausea and vomiting.  Genitourinary: Negative for dysuria, flank pain, frequency, hematuria and urgency.  Musculoskeletal: Negative for back pain, joint pain and myalgias.  Skin: Negative for rash.  Neurological: Negative for dizziness, tingling, focal weakness, seizures, weakness and headaches.  Endo/Heme/Allergies: Does not bruise/bleed easily.  Psychiatric/Behavioral: Negative for depression and suicidal ideas. The patient does not have insomnia.     Allergies  Allergen Reactions  . Ace Inhibitors Other (See Comments)    Cough  . Hydrochlorothiazide Other (See Comments)    Dizziness  . Triamterene-Hctz Other (See Comments)    Appetite loss    Patient Active Problem List   Diagnosis Date Noted  . Subareolar mass of left breast 04/01/2017  . Essential (primary) hypertension 07/24/2015  . Gastro-esophageal reflux disease without esophagitis 07/24/2015  . Hyperlipidemia associated with type 2 diabetes mellitus ([place]) 07/24/2015  . Type 2 diabetes mellitus with stage 3 chronic kidney disease, without long-term current use of insulin ([place]) 07/24/2015  . Unexplained weight loss 07/24/2015  . Fibrocystic breast changes 07/24/2015     Past Medical History:  Diagnosis Date  . Anemia   . Diabetes mellitus without complication (HCC)    diet controlled  . Edema    ankles  . GERD (gastroesophageal reflux disease)   .  Hypertension      Past Surgical History:  Procedure Laterality Date  . ABDOMINAL HYSTERECTOMY  1980  . BREAST LUMPECTOMY WITH NEEDLE LOCALIZATION Left 04/07/2017   Procedure: BREAST  LUMPECTOMY WITH NEEDLE LOCALIZATION;  Surgeon: Vickie Epley, MD;  Location: [facility] ORS;  Service: General;  Laterality: Left;  . BREAST SURGERY Left    Breast Biopsy  . CATARACT EXTRACTION W/PHACO Right 06/12/2016   Procedure: CATARACT EXTRACTION PHACO AND INTRAOCULAR LENS PLACEMENT (IOC);  Surgeon: Eulogio Bear, MD;  Location: [facility] ORS;  Service: Ophthalmology;  Laterality: Right;  [place] 6.1 AP% 12.7 CDE .77 FLUID PACK LOT # [id number]  . COLONOSCOPY    . EYE SURGERY Right    Cataract Extraction with IOL    Social History   Social History  . Marital status: Married    Spouse name: N/A  . Number of children: N/A  . Years of education: N/A   Occupational History  . Not on file.   Social History Main Topics  . Smoking status: Never Smoker  . Smokeless tobacco: Never Used  . Alcohol use No  . Drug use: No  . Sexual activity: Not on file   Other Topics Concern  . Not on file   Social History Narrative  . No narrative on file     Family History  Problem Relation Age of Onset  . Breast cancer Mother 6  . Diabetes Mother   . Hypertension Mother   . CAD Father   . Breast cancer Sister   . Hypertension Sister   . Breast cancer Maternal Aunt 62  . Throat cancer Brother      Current Outpatient Prescriptions:  .  ACCU-CHEK SOFTCLIX LANCETS lancets, 1 each by Other route 2 (two) times daily. Use as instructed, Disp: 100 each, Rfl: 12 .  atorvastatin (LIPITOR) 10 MG tablet, Take 1 tablet (10 mg total) by mouth daily. (Patient taking differently: Take 10 mg by mouth every evening. ), Disp: 90 tablet, Rfl: 3 .  fluticasone (FLONASE) 50 MCG/ACT nasal spray, Place 2 sprays into both nostrils daily., Disp: 48 g, Rfl: 3 .  glucose blood (ACCU-CHEK AVIVA PLUS) test strip, Use to test blood sugar daily, Disp: 100 each, Rfl: 12 .  metoprolol succinate (TOPROL-XL) 25 MG 24 hr tablet, Take 1 tablet (25 mg total) by mouth 2 (two) times daily., Disp: 180 tablet, Rfl: 3 .  Multiple  Vitamins-Calcium (ONE-A-DAY WOMENS PO), Take 1 tablet by mouth every other day., Disp: , Rfl:  .  omeprazole (PRILOSEC) 20 MG capsule, Take 1 capsule (20 mg total) by mouth daily., Disp: 90 capsule, Rfl: 3 .  aspirin EC 81 MG tablet, Take 81 mg by mouth daily as needed (for sinus or pain). , Disp: , Rfl:    Physical exam:  Vitals:   04/24/17 1522  BP: (!) 184/87  Pulse: 74  Resp: 18  Temp: 98.1 F (36.7 C)  TempSrc: Tympanic  Weight: 144 lb 9.6 oz (65.6 kg)  Height: 5' 6.14" (1.68 m)   Physical Exam  Constitutional: She is oriented to person, place, and time and well-developed, well-nourished, and in no distress.  HENT:  Head: Normocephalic and atraumatic.  Eyes: EOM are normal. Pupils are equal, round, and reactive to light.  Neck: Normal range of motion.  Cardiovascular: Normal rate, regular rhythm and normal heart sounds.   Pulmonary/Chest: Effort normal and breath sounds normal.  Abdominal: Soft. Bowel sounds are normal.  Musculoskeletal: She exhibits edema (b/L +2. L >R).  Neurological: She is alert and oriented to person, place, and time.  Skin: Skin is warm and dry.    Breast exam was performed in seated and lying down position. Patient is status post left lumpectomy with a well-healed surgical scar. No evidence of any palpable masses. No evidence of axillary adenopathy. No evidence of any palpable masses or lumps in the right breast. No evidence of right axillary adenopathy    CMP Latest Ref Rng & Units 04/24/2017  Glucose 65 - 99 mg/dL 104(H)  BUN 6 - 20 mg/dL 10  Creatinine 0.44 - 1.00 mg/dL 1.00  Sodium 135 - 145 mmol/L 139  Potassium 3.5 - 5.1 mmol/L 3.8  Chloride 101 - 111 mmol/L 103  CO2 22 - 32 mmol/L 28  Calcium 8.9 - 10.3 mg/dL 9.6  Total Protein 6.5 - 8.1 g/dL 8.3(H)  Total Bilirubin 0.3 - 1.2 mg/dL 0.5  Alkaline Phos 38 - 126 U/L 82  AST 15 - 41 U/L 20  ALT 14 - 54 U/L 12(L)   CBC Latest Ref Rng & Units 04/24/2017  WBC 3.6 - 11.0 K/uL 4.2    Hemoglobin 12.0 - 16.0 g/dL 13.0  Hematocrit 35.0 - 47.0 % 38.4  Platelets 150 - 440 K/uL 347    No images are attached to the encounter.  Chest 2 View  Result Date: 04/03/2017 CLINICAL DATA:  Preop.  Left breast mass. EXAM: CHEST  2 VIEW COMPARISON:  06/30/2012 FINDINGS: Mild hyperinflation. Heart and mediastinal contours are within normal limits. No focal opacities or effusions. No acute bony abnormality. IMPRESSION: No active cardiopulmonary disease. Electronically Signed   By: Rolm Baptise M.D.   On: 04/03/2017 13:03   Mm Breast Surgical Specimen  Result Date: 04/07/2017 CLINICAL DATA:  Specimen radiograph status post left breast excisional biopsy. EXAM: SPECIMEN RADIOGRAPH OF THE LEFT BREAST COMPARISON:  Previous exam(s). FINDINGS: Status post excision of the left breast. The wire tip and biopsy marker clip are present and are marked for pathology. These findings were communicated with Dr. Rosana Hoes in the OR at 1:15 p.m. IMPRESSION: Specimen radiograph of the left breast. Electronically Signed   By: Ammie Ferrier M.D.   On: 04/07/2017 13:16   Mm Lt Plc Breast Loc Dev   1st Lesion  Inc Mammo Guide  Result Date: 04/07/2017 CLINICAL DATA:  71 year old female presenting for wire localization of a left breast mass prior to surgical excision. EXAM: NEEDLE LOCALIZATION OF THE LEFT BREAST WITH MAMMO GUIDANCE COMPARISON:  Previous exams. FINDINGS: Patient presents for needle localization prior to left breast excisional biopsy. I met with the patient and we discussed the procedure of needle localization including benefits and alternatives. We discussed the high likelihood of a successful procedure. We discussed the risks of the procedure, including infection, bleeding, tissue injury, and further surgery. Informed, written consent was given. The usual time-out protocol was performed immediately prior to the procedure. Using mammographic guidance, sterile technique, 1% lidocaine and a 5 cm modified  Kopans needle, the wing shaped biopsy marking clip in the left breast was localized using a superior approach. The images were marked for Dr. Rosana Hoes. IMPRESSION: Needle localization left breast. No apparent complications. Electronically Signed   By: Ammie Ferrier M.D.   On: 04/07/2017 08:54    Assessment and plan- Patient is a 71 y.o. female with left breast DCIS  Given the small size of DCIS with negative margins and her age as well as intermediate grade, I do not think patient needs adjuavnt radiation therapy  Given that patient has ER + DCIS, I would recommend 5 years of hormone therapy with AI. I discussed risks and benefits of arimidex including all but not limited to fatigue, arthralgias, worsening bone health, hypercholesterolemia. Patient understands and agrees to proceed. She will also need baseline bone density scan and should take calcium 1200 mg and vit D 800 IU. I will obtain cbc, cmp today. See her back in 6 weeks time to see how she is tolerating her AI  B/l LE edema- obtain b/l LE USG and we will touch base with her pcp regarding this   Thank you for this kind referral and the opportunity to participate in the care of this patient   Visit Diagnosis 1. Bilateral leg edema   2. Ductal carcinoma in situ (DCIS) of left breast     Dr. Randa Evens, MD, MPH [place] [facility] Pager- [phone] 04/24/2017

## 2017-04-27 ENCOUNTER — Encounter: Payer: Self-pay | Admitting: Oncology

## 2017-04-28 ENCOUNTER — Encounter: Payer: Self-pay | Admitting: Internal Medicine

## 2017-04-28 ENCOUNTER — Ambulatory Visit
Admission: RE | Admit: 2017-04-28 | Discharge: 2017-04-28 | Disposition: A | Discharge status: Home / Self Care | Payer: Medicare Other | Source: Ambulatory Visit | Attending: Oncology | Admitting: Oncology

## 2017-04-28 ENCOUNTER — Ambulatory Visit (INDEPENDENT_AMBULATORY_CARE_PROVIDER_SITE_OTHER): Payer: Medicare Other | Admitting: Internal Medicine

## 2017-04-28 VITALS — BP 148/82 | HR 70 | Ht 68.0 in | Wt 142.0 lb

## 2017-04-28 DIAGNOSIS — M8588 Other specified disorders of bone density and structure, other site: Secondary | ICD-10-CM | POA: Diagnosis not present

## 2017-04-28 DIAGNOSIS — Z780 Asymptomatic menopausal state: Secondary | ICD-10-CM | POA: Diagnosis not present

## 2017-04-28 DIAGNOSIS — M85852 Other specified disorders of bone density and structure, left thigh: Secondary | ICD-10-CM | POA: Insufficient documentation

## 2017-04-28 DIAGNOSIS — E119 Type 2 diabetes mellitus without complications: Secondary | ICD-10-CM | POA: Insufficient documentation

## 2017-04-28 DIAGNOSIS — I10 Essential (primary) hypertension: Secondary | ICD-10-CM

## 2017-04-28 DIAGNOSIS — R600 Localized edema: Secondary | ICD-10-CM

## 2017-04-28 DIAGNOSIS — D0512 Intraductal carcinoma in situ of left breast: Secondary | ICD-10-CM | POA: Insufficient documentation

## 2017-04-28 NOTE — Patient Instructions (Addendum)
Elevate legs (toes above the nose)  Drink more water and less soda/juice/coffee/tea  Limit salt intake (do not add salt to food and eat very little of salty foods)  Can wear compression stocking during the day - do not wear at night

## 2017-04-28 NOTE — Progress Notes (Signed)
Date:  04/28/2017   Name:  Brandy Diaz   DOB:  [birth year]   MRN:  [id number]   Chief Complaint: Joint Swelling (Both Ankles swelling. Started at the beginning of last week. Was seen downstairs today for Bone Density scan and wanted to stop by to see if her ankles could be looked at. )  She recently underwent breast biopsy positive for DCIS.  She has seen Oncology and they plan to start AI.  She has been eating more TV dinners and pre-made foods with sodium recently.  She has also been keeping her legs in a dependent position.  Last week she was sent by Oncology for [place].  This did rule out DVT.  CBC and CMP were basically normal.  TSH in March was normal.  She has been elevating her legs when sitting and is much improved.  Review of Systems  Constitutional: Negative for chills, fatigue and fever.  Respiratory: Negative for chest tightness, shortness of breath and wheezing.   Cardiovascular: Positive for leg swelling. Negative for chest pain and palpitations.  Gastrointestinal: Negative for abdominal pain.  Genitourinary: Negative for dysuria.    Patient Active Problem List   Diagnosis Date Noted  . Localized edema 04/28/2017  . DCIS (ductal carcinoma in situ) of breast 04/01/2017  . Essential (primary) hypertension 07/24/2015  . Gastro-esophageal reflux disease without esophagitis 07/24/2015  . Hyperlipidemia associated with type 2 diabetes mellitus ([place]) 07/24/2015  . Type 2 diabetes mellitus with stage 3 chronic kidney disease, without long-term current use of insulin ([place]) 07/24/2015  . Unexplained weight loss 07/24/2015  . Fibrocystic breast changes 07/24/2015    Prior to Admission medications   Medication Sig Start Date End Date Taking? Authorizing Provider  ACCU-CHEK SOFTCLIX LANCETS lancets 1 each by Other route 2 (two) times daily. Use as instructed 09/23/16  Yes Glean Hess, MD  aspirin EC 81 MG tablet Take 81 mg by mouth daily as needed (for sinus or pain).     Yes [provider]  atorvastatin (LIPITOR) 10 MG tablet Take 1 tablet (10 mg total) by mouth daily. Patient taking differently: Take 10 mg by mouth every evening.  09/26/16  Yes Glean Hess, MD  fluticasone [facility]) 50 MCG/ACT nasal spray Place 2 sprays into both nostrils daily. 01/19/17  Yes Glean Hess, MD  glucose blood (ACCU-CHEK AVIVA PLUS) test strip Use to test blood sugar daily 01/19/17  Yes Glean Hess, MD  metoprolol succinate (TOPROL-XL) 25 MG 24 hr tablet Take 1 tablet (25 mg total) by mouth 2 (two) times daily. 09/23/16  Yes Glean Hess, MD  Multiple Vitamins-Calcium (ONE-A-DAY WOMENS PO) Take 1 tablet by mouth every other day.   Yes [provider]  omeprazole (PRILOSEC) 20 MG capsule Take 1 capsule (20 mg total) by mouth daily. 09/23/16  Yes Glean Hess, MD    Allergies  Allergen Reactions  . Ace Inhibitors Other (See Comments)    Cough  . Hydrochlorothiazide Other (See Comments)    Dizziness  . Triamterene-Hctz Other (See Comments)    Appetite loss    Past Surgical History:  Procedure Laterality Date  . ABDOMINAL HYSTERECTOMY  1980  . BREAST LUMPECTOMY WITH NEEDLE LOCALIZATION Left 04/07/2017   Procedure: BREAST LUMPECTOMY WITH NEEDLE LOCALIZATION;  Surgeon: Vickie Epley, MD;  Location: [facility] ORS;  Service: General;  Laterality: Left;  . BREAST SURGERY Left    Breast Biopsy  . CATARACT EXTRACTION W/PHACO Right 06/12/2016  Procedure: CATARACT EXTRACTION PHACO AND INTRAOCULAR LENS PLACEMENT (IOC);  Surgeon: Eulogio Bear, MD;  Location: [facility] ORS;  Service: Ophthalmology;  Laterality: Right;  [place] 6.1 AP% 12.7 CDE .77 FLUID PACK LOT # [id number]  . COLONOSCOPY    . EYE SURGERY Right    Cataract Extraction with IOL    Social History  Substance Use Topics  . Smoking status: Never Smoker  . Smokeless tobacco: Never Used  . Alcohol use No     Medication list has been reviewed and updated.   Physical Exam    Constitutional: She is oriented to person, place, and time. She appears well-developed. No distress.  HENT:  Head: Normocephalic and atraumatic.  Neck: Normal range of motion. Neck supple. No JVD present. Carotid bruit is not present.  Cardiovascular: Normal rate, regular rhythm and normal heart sounds.   Pulmonary/Chest: Effort normal and breath sounds normal. No respiratory distress. She has no wheezes.  Abdominal: Normal appearance and bowel sounds are normal. There is no tenderness.  Musculoskeletal: Normal range of motion. She exhibits edema (1+ edema to mid tibia bilaterally).  Neurological: She is alert and oriented to person, place, and time.  Skin: Skin is warm and dry. No rash noted.  Psychiatric: She has a normal mood and affect. Her behavior is normal. Thought content normal.  Nursing note and vitals reviewed.   BP (!) 148/82   Pulse 70   Ht 5\' 8"  (1.727 m)   Wt 142 lb (64.4 kg)   SpO2 99%   BMI 21.59 kg/m   Assessment and Plan: 1. Localized edema Increase water and decrease sodium Elevate legs "toes above the nose" Compression stockings during the day if needed  2. Essential (primary) hypertension Continue current medication  3. Ductal carcinoma in situ (DCIS) of left breast Begin followed by Oncology Will likely start anti-estrogen therapy   No orders of the defined types were placed in this encounter.   Halina Maidens, MD [facility]  04/28/2017

## 2017-05-04 ENCOUNTER — Encounter: Payer: Medicare Other | Admitting: Surgery

## 2017-05-06 ENCOUNTER — Other Ambulatory Visit: Payer: Self-pay | Admitting: *Deleted

## 2017-05-06 ENCOUNTER — Telehealth: Payer: Self-pay | Admitting: *Deleted

## 2017-05-06 MED ORDER — ANASTROZOLE 1 MG PO TABS: 1.0000 mg | ORAL_TABLET | Freq: Every day | ORAL | 3 refills | Status: DC

## 2017-05-06 NOTE — Telephone Encounter (Addendum)
Pt called and left a message that she thought the doctor was suppose to send rx for armidex. She checked with pharmacy and it is not there.  I reviewed the note from Janese Banks and she put in there to start her on the arimidex after he bone scan.  I called Janese Banks and she wants to review her bone density results before she gives the arimidex or offers something different.  I called pt and got her voicemail and left a message but then I called later and got in touch with her and told her that I spoke to Janese Banks and she wants to review the bone denisity before she decides on medication. I did send medication in but put it on hold until Janese Banks decides and I have asked Janese Banks to call her tom. To let her know about the medication and she is agreeable to this.

## 2017-05-08 NOTE — Telephone Encounter (Signed)
Called patient to inform her that Dr. Janese Banks will call her on Monday to discuss hormone replacement therapy options with her.  Patient verbalized understanding.

## 2017-05-08 NOTE — Telephone Encounter (Signed)
Please let patient know I will call her on Monday and discuss hormone therapy treatment options with her

## 2017-05-08 NOTE — Telephone Encounter (Signed)
Please call.

## 2017-05-10 ENCOUNTER — Other Ambulatory Visit: Payer: Self-pay | Admitting: Oncology

## 2017-05-10 MED ORDER — ANASTROZOLE 1 MG PO TABS: 1.0000 mg | ORAL_TABLET | Freq: Every day | ORAL | 3 refills | Status: DC

## 2017-05-10 NOTE — Telephone Encounter (Signed)
FYI.[name] KitchenMarland KitchenSpoke to patient today and explained the bone density results. Discussed tamoxifen versus AI. She agrees to take AI. Prescription sent to pharmacy. Thanks

## 2017-05-21 ENCOUNTER — Ambulatory Visit (INDEPENDENT_AMBULATORY_CARE_PROVIDER_SITE_OTHER): Payer: Medicare Other | Admitting: Internal Medicine

## 2017-05-21 ENCOUNTER — Encounter: Payer: Self-pay | Admitting: Internal Medicine

## 2017-05-21 VITALS — BP 138/62 | HR 69 | Ht 68.0 in | Wt 143.0 lb

## 2017-05-21 DIAGNOSIS — N183 Chronic kidney disease, stage 3 (moderate): Secondary | ICD-10-CM | POA: Diagnosis not present

## 2017-05-21 DIAGNOSIS — D0512 Intraductal carcinoma in situ of left breast: Secondary | ICD-10-CM | POA: Insufficient documentation

## 2017-05-21 DIAGNOSIS — E1122 Type 2 diabetes mellitus with diabetic chronic kidney disease: Secondary | ICD-10-CM

## 2017-05-21 DIAGNOSIS — I10 Essential (primary) hypertension: Secondary | ICD-10-CM | POA: Diagnosis not present

## 2017-05-21 NOTE — Progress Notes (Signed)
Date:  05/21/2017   Name:  Brandy Diaz   DOB:  1946-02-08   MRN:  [id number]   Chief Complaint: Hypertension and Edema (Still having some swelling in left ankle but otherwise resolved) Hypertension  This is a chronic problem. The problem is controlled. Pertinent negatives include no chest pain, headaches, palpitations or shortness of breath. There are no compliance problems.   Diabetes  She presents for her follow-up diabetic visit. She has type 2 diabetes mellitus. Her disease course has been stable. Pertinent negatives for hypoglycemia include no headaches or tremors. Pertinent negatives for diabetes include no chest pain, no fatigue, no polydipsia and no polyuria. Current diabetic treatment includes diet. She monitors blood glucose at home 1-2 x per day. Her breakfast blood glucose is taken between 6-7 am. Her breakfast blood glucose range is generally 110-130 mg/dl.  DCIS left breast - now on anti-estrogen therapy.    Review of Systems  Constitutional: Negative for appetite change, fatigue, fever and unexpected weight change.  HENT: Negative for tinnitus and trouble swallowing.   Eyes: Negative for visual disturbance.  Respiratory: Negative for cough, chest tightness and shortness of breath.   Cardiovascular: Negative for chest pain, palpitations and leg swelling.  Gastrointestinal: Negative for abdominal pain.  Endocrine: Negative for polydipsia and polyuria.  Genitourinary: Negative for dysuria and hematuria.  Musculoskeletal: Negative for arthralgias.  Neurological: Negative for tremors, numbness and headaches.  Psychiatric/Behavioral: Negative for dysphoric mood.    Patient Active Problem List   Diagnosis Date Noted  . Localized edema 04/28/2017  . DCIS (ductal carcinoma in situ) of breast 04/01/2017  . Essential (primary) hypertension 07/24/2015  . Gastro-esophageal reflux disease without esophagitis 07/24/2015  . Hyperlipidemia associated with type 2 diabetes  mellitus ([place]) 07/24/2015  . Type 2 diabetes mellitus with stage 3 chronic kidney disease, without long-term current use of insulin ([place]) 07/24/2015  . Unexplained weight loss 07/24/2015  . Fibrocystic breast changes 07/24/2015    Prior to Admission medications   Medication Sig Start Date End Date Taking? Authorizing Provider  ACCU-CHEK SOFTCLIX LANCETS lancets 1 each by Other route 2 (two) times daily. Use as instructed 09/23/16  Yes Glean Hess, MD  anastrozole (ARIMIDEX) 1 MG tablet Take 1 tablet (1 mg total) by mouth daily. 05/10/17  Yes Sindy Guadeloupe, MD  aspirin EC 81 MG tablet Take 81 mg by mouth daily as needed (for sinus or pain).    Yes [provider]  atorvastatin (LIPITOR) 10 MG tablet Take 1 tablet (10 mg total) by mouth daily. Patient taking differently: Take 10 mg by mouth every evening.  09/26/16  Yes Glean Hess, MD  fluticasone [facility]) 50 MCG/ACT nasal spray Place 2 sprays into both nostrils daily. 01/19/17  Yes Glean Hess, MD  glucose blood (ACCU-CHEK AVIVA PLUS) test strip Use to test blood sugar daily 01/19/17  Yes Glean Hess, MD  metoprolol succinate (TOPROL-XL) 25 MG 24 hr tablet Take 1 tablet (25 mg total) by mouth 2 (two) times daily. 09/23/16  Yes Glean Hess, MD  Multiple Vitamins-Calcium (ONE-A-DAY WOMENS PO) Take 1 tablet by mouth every other day.   Yes [provider]  omeprazole (PRILOSEC) 20 MG capsule Take 1 capsule (20 mg total) by mouth daily. 09/23/16  Yes Glean Hess, MD    Allergies  Allergen Reactions  . Ace Inhibitors Other (See Comments)    Cough  . Hydrochlorothiazide Other (See Comments)    Dizziness  . Triamterene-Hctz  Other (See Comments)    Appetite loss    Past Surgical History:  Procedure Laterality Date  . ABDOMINAL HYSTERECTOMY  1980  . BREAST LUMPECTOMY WITH NEEDLE LOCALIZATION Left 04/07/2017   Procedure: BREAST LUMPECTOMY WITH NEEDLE LOCALIZATION;  Surgeon: Vickie Epley,  MD;  Location: [facility] ORS;  Service: General;  Laterality: Left;  . BREAST SURGERY Left    Breast Biopsy  . CATARACT EXTRACTION W/PHACO Right 06/12/2016   Procedure: CATARACT EXTRACTION PHACO AND INTRAOCULAR LENS PLACEMENT (IOC);  Surgeon: Eulogio Bear, MD;  Location: [facility] ORS;  Service: Ophthalmology;  Laterality: Right;  [place] 6.1 AP% 12.7 CDE .77 FLUID PACK LOT # [id number]  . COLONOSCOPY    . EYE SURGERY Right    Cataract Extraction with IOL    Social History  Substance Use Topics  . Smoking status: Never Smoker  . Smokeless tobacco: Never Used  . Alcohol use No     Medication list has been reviewed and updated.   Physical Exam  Constitutional: She is oriented to person, place, and time. She appears well-developed. No distress.  HENT:  Head: Normocephalic and atraumatic.  Neck: Normal range of motion. Neck supple. No thyromegaly present.  Cardiovascular: Normal rate, regular rhythm and normal heart sounds.   Pulmonary/Chest: Effort normal and breath sounds normal. No respiratory distress.  Musculoskeletal: She exhibits edema (trace ankle).  Neurological: She is alert and oriented to person, place, and time.  Skin: Skin is warm and dry. No rash noted.  Psychiatric: She has a normal mood and affect. Her behavior is normal. Thought content normal.  Nursing note and vitals reviewed.   BP 138/62   Pulse 69   Ht 5\' 8"  (1.727 m)   Wt 143 lb (64.9 kg)   SpO2 99%   BMI 21.74 kg/m   Assessment and Plan: 1. Essential (primary) hypertension Controlled Edema improved  2. Type 2 diabetes mellitus with stage 3 chronic kidney disease, without long-term current use of insulin (HCC) Continue diet and FSBS - Hemoglobin A1c  3. Ductal carcinoma in situ (DCIS) of left breast Now on oral therapy   No orders of the defined types were placed in this encounter.   Halina Maidens, MD [facility]  05/21/2017

## 2017-05-22 LAB — HEMOGLOBIN A1C
Est. average glucose Bld gHb Est-mCnc: 131 mg/dL
Hgb A1c MFr Bld: 6.2 % — ABNORMAL HIGH (ref 4.8–5.6)

## 2017-06-04 ENCOUNTER — Other Ambulatory Visit: Payer: Self-pay

## 2017-06-04 DIAGNOSIS — D0512 Intraductal carcinoma in situ of left breast: Secondary | ICD-10-CM

## 2017-06-05 ENCOUNTER — Inpatient Hospital Stay: Payer: Medicare Other | Attending: Oncology

## 2017-06-05 ENCOUNTER — Inpatient Hospital Stay (HOSPITAL_BASED_OUTPATIENT_CLINIC_OR_DEPARTMENT_OTHER): Payer: Medicare Other | Admitting: Oncology

## 2017-06-05 VITALS — BP 168/83 | HR 67 | Temp 97.9°F | Resp 16 | Wt 143.3 lb

## 2017-06-05 DIAGNOSIS — Z7982 Long term (current) use of aspirin: Secondary | ICD-10-CM | POA: Diagnosis not present

## 2017-06-05 DIAGNOSIS — I10 Essential (primary) hypertension: Secondary | ICD-10-CM | POA: Diagnosis not present

## 2017-06-05 DIAGNOSIS — Z170 Estrogen receptor positive status [ER+]: Secondary | ICD-10-CM | POA: Insufficient documentation

## 2017-06-05 DIAGNOSIS — Z803 Family history of malignant neoplasm of breast: Secondary | ICD-10-CM | POA: Diagnosis not present

## 2017-06-05 DIAGNOSIS — Z79899 Other long term (current) drug therapy: Secondary | ICD-10-CM | POA: Diagnosis not present

## 2017-06-05 DIAGNOSIS — E119 Type 2 diabetes mellitus without complications: Secondary | ICD-10-CM | POA: Insufficient documentation

## 2017-06-05 DIAGNOSIS — R609 Edema, unspecified: Secondary | ICD-10-CM

## 2017-06-05 DIAGNOSIS — D649 Anemia, unspecified: Secondary | ICD-10-CM | POA: Insufficient documentation

## 2017-06-05 DIAGNOSIS — M8580 Other specified disorders of bone density and structure, unspecified site: Secondary | ICD-10-CM | POA: Insufficient documentation

## 2017-06-05 DIAGNOSIS — D0512 Intraductal carcinoma in situ of left breast: Secondary | ICD-10-CM

## 2017-06-05 DIAGNOSIS — Z801 Family history of malignant neoplasm of trachea, bronchus and lung: Secondary | ICD-10-CM

## 2017-06-05 DIAGNOSIS — M7989 Other specified soft tissue disorders: Secondary | ICD-10-CM | POA: Diagnosis not present

## 2017-06-05 DIAGNOSIS — Z79811 Long term (current) use of aromatase inhibitors: Secondary | ICD-10-CM

## 2017-06-05 DIAGNOSIS — R232 Flushing: Secondary | ICD-10-CM

## 2017-06-05 DIAGNOSIS — K219 Gastro-esophageal reflux disease without esophagitis: Secondary | ICD-10-CM

## 2017-06-05 LAB — CBC
HEMATOCRIT: 37.3 % (ref 35.0–47.0)
HEMOGLOBIN: 12.7 g/dL (ref 12.0–16.0)
MCH: 29.8 pg (ref 26.0–34.0)
MCHC: 34 g/dL (ref 32.0–36.0)
MCV: 87.7 fL (ref 80.0–100.0)
Platelets: 338 10*3/uL (ref 150–440)
RBC: 4.26 MIL/uL (ref 3.80–5.20)
RDW: 13.9 % (ref 11.5–14.5)
WBC: 4.6 10*3/uL (ref 3.6–11.0)

## 2017-06-05 LAB — COMPREHENSIVE METABOLIC PANEL
ALK PHOS: 87 U/L (ref 38–126)
ALT: 15 U/L (ref 14–54)
ANION GAP: 9 (ref 5–15)
AST: 21 U/L (ref 15–41)
Albumin: 3.8 g/dL (ref 3.5–5.0)
BILIRUBIN TOTAL: 0.4 mg/dL (ref 0.3–1.2)
BUN: 13 mg/dL (ref 6–20)
CALCIUM: 9.5 mg/dL (ref 8.9–10.3)
CO2: 27 mmol/L (ref 22–32)
Chloride: 100 mmol/L — ABNORMAL LOW (ref 101–111)
Creatinine, Ser: 1.05 mg/dL — ABNORMAL HIGH (ref 0.44–1.00)
GFR calc Af Amer: 60 mL/min — ABNORMAL LOW (ref >60)
GFR, EST NON AFRICAN AMERICAN: 52 mL/min — AB (ref >60)
Glucose, Bld: 94 mg/dL (ref 65–99)
POTASSIUM: 4.3 mmol/L (ref 3.5–5.1)
Sodium: 136 mmol/L (ref 135–145)
TOTAL PROTEIN: 7.9 g/dL (ref 6.5–8.1)

## 2017-06-05 NOTE — Progress Notes (Signed)
Patient started Arimidex on 05/17/17 and when she first started taking the med she began itching with feeling of lip swelling.  Those symptoms have improved but does have hot flashes.

## 2017-06-05 NOTE — Progress Notes (Signed)
Hematology/Oncology Consult note [facility]  Telephone:([phone] [phone]  Patient Care Team: Glean Hess, MD as PCP - General (Family Medicine)   Name of the patient: Brandy Diaz  [id number]  [birth year]   Date of visit: 06/05/17  Diagnosis- left breast ER+ DCIS  Chief complaint/ Reason for visit- assess tolerance to arimidex  Heme/Onc history:  Patient is a 71 year old female with a past medical history significant for hypertension and diet-controlled diabetes she recently underwent screening mammogram which showed an abnormal mass in the left breast 3 cm from the nipple 1 x 0.9 x 0.3 cm in size and mobile mass at 4:00 position 0.8 x 0.7 x 0.5 cm in size. Evaluation of axilla was negative for adenopathy. This was followed by ultrasound and the mass at the 5:30 position was stable long-term and consistent with benign lesion and core biopsy of the 4:00 position mass was recommended.  2. 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   3. 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 No necrosis was identified. Margins were negative for DCIS 8 mm from the deep margin. ER 5190% positive PR negative  4. Patient lives with her husband and is independent of her ADLs and IADLs. Menarche at 46 years. She does not have any children. She never breast-fed and was never on hormone replacement therapy. She has a remote history of hysterectomy probably secondary to fibroids. She has been having on and off leg swelling but it has been particularly worse over the last 1 week and her swelling has not subsided. Denies any shortness of breath  5. Recent bone density scan in June 2018 showed T score of -2.2 consistent with osteopenia. 10 year probability of a major osteoporotic fracture was 6% and hip fracture was  1.5%. Patient started on arimidex in June 2018   Interval history- tolerating arimidex well. She is also taking calcum and vit D. Occasional hot flashes. No other comaplints. Leg swelling has improved  ECOG PS- 1 Pain scale- 0   Review of systems- Review of Systems  Constitutional: Negative for chills, fever, malaise/fatigue and weight loss.  HENT: Negative for congestion, ear discharge and nosebleeds.   Eyes: Negative for blurred vision.  Respiratory: Negative for cough, hemoptysis, sputum production, shortness of breath and wheezing.   Cardiovascular: Negative for chest pain, palpitations, orthopnea and claudication.  Gastrointestinal: Negative for abdominal pain, blood in stool, constipation, diarrhea, heartburn, melena, nausea and vomiting.  Genitourinary: Negative for dysuria, flank pain, frequency, hematuria and urgency.  Musculoskeletal: Negative for back pain, joint pain and myalgias.  Skin: Negative for rash.  Neurological: Negative for dizziness, tingling, focal weakness, seizures, weakness and headaches.  Endo/Heme/Allergies: Does not bruise/bleed easily.  Psychiatric/Behavioral: Negative for depression and suicidal ideas. The patient does not have insomnia.       Allergies  Allergen Reactions  . Ace Inhibitors Other (See Comments)    Cough  . Hydrochlorothiazide Other (See Comments)    Dizziness  . Triamterene-Hctz Other (See Comments)    Appetite loss     Past Medical History:  Diagnosis Date  . Anemia   . Diabetes mellitus without complication (HCC)    diet controlled  . Edema    ankles  . GERD (gastroesophageal reflux disease)   . Hypertension      Past Surgical History:  Procedure Laterality  Date  . ABDOMINAL HYSTERECTOMY  1980  . BREAST LUMPECTOMY WITH NEEDLE LOCALIZATION Left 04/07/2017   Procedure: BREAST LUMPECTOMY WITH NEEDLE LOCALIZATION;  Surgeon: Vickie Epley, MD;  Location: [facility] ORS;  Service: General;  Laterality: Left;  . BREAST  SURGERY Left    Breast Biopsy  . CATARACT EXTRACTION W/PHACO Right 06/12/2016   Procedure: CATARACT EXTRACTION PHACO AND INTRAOCULAR LENS PLACEMENT (IOC);  Surgeon: Eulogio Bear, MD;  Location: [facility] ORS;  Service: Ophthalmology;  Laterality: Right;  [place] 6.1 AP% 12.7 CDE .77 FLUID PACK LOT # [id number]  . COLONOSCOPY    . EYE SURGERY Right    Cataract Extraction with IOL    Social History   Social History  . Marital status: Married    Spouse name: N/A  . Number of children: N/A  . Years of education: N/A   Occupational History  . Not on file.   Social History Main Topics  . Smoking status: Never Smoker  . Smokeless tobacco: Never Used  . Alcohol use No  . Drug use: No  . Sexual activity: Not on file   Other Topics Concern  . Not on file   Social History Narrative  . No narrative on file    Family History  Problem Relation Age of Onset  . Breast cancer Mother 52  . Diabetes Mother   . Hypertension Mother   . CAD Father   . Breast cancer Sister   . Hypertension Sister   . Breast cancer Maternal Aunt 60  . Throat cancer Brother      Current Outpatient Prescriptions:  .  ACCU-CHEK SOFTCLIX LANCETS lancets, 1 each by Other route 2 (two) times daily. Use as instructed, Disp: 100 each, Rfl: 12 .  anastrozole (ARIMIDEX) 1 MG tablet, Take 1 tablet (1 mg total) by mouth daily., Disp: 30 tablet, Rfl: 3 .  aspirin EC 81 MG tablet, Take 81 mg by mouth daily as needed (for sinus or pain). , Disp: , Rfl:  .  atorvastatin (LIPITOR) 10 MG tablet, Take 1 tablet (10 mg total) by mouth daily. (Patient taking differently: Take 10 mg by mouth every evening. ), Disp: 90 tablet, Rfl: 3 .  fluticasone (FLONASE) 50 MCG/ACT nasal spray, Place 2 sprays into both nostrils daily., Disp: 48 g, Rfl: 3 .  glucose blood (ACCU-CHEK AVIVA PLUS) test strip, Use to test blood sugar daily, Disp: 100 each, Rfl: 12 .  metoprolol succinate (TOPROL-XL) 25 MG 24 hr tablet, Take 1 tablet (25 mg total) by  mouth 2 (two) times daily., Disp: 180 tablet, Rfl: 3 .  Multiple Vitamins-Calcium (ONE-A-DAY WOMENS PO), Take 1 tablet by mouth every other day., Disp: , Rfl:  .  omeprazole (PRILOSEC) 20 MG capsule, Take 1 capsule (20 mg total) by mouth daily., Disp: 90 capsule, Rfl: 3  Physical exam:  Vitals:   06/05/17 1347  BP: (!) 168/83  Pulse: 67  Resp: 16  Temp: 97.9 F (36.6 C)  TempSrc: Tympanic  Weight: 143 lb 4.8 oz (65 kg)   Physical Exam  Constitutional: She is oriented to person, place, and time and well-developed, well-nourished, and in no distress.  HENT:  Head: Normocephalic and atraumatic.  Eyes: Pupils are equal, round, and reactive to light. EOM are normal.  Neck: Normal range of motion.  Cardiovascular: Normal rate, regular rhythm and normal heart sounds.   Pulmonary/Chest: Effort normal and breath sounds normal.  Abdominal: Soft. Bowel sounds are normal.  Musculoskeletal: She exhibits edema (L>R).  Neurological:  She is alert and oriented to person, place, and time.  Skin: Skin is warm and dry.     CMP Latest Ref Rng & Units 04/24/2017  Glucose 65 - 99 mg/dL 104(H)  BUN 6 - 20 mg/dL 10  Creatinine 0.44 - 1.00 mg/dL 1.00  Sodium 135 - 145 mmol/L 139  Potassium 3.5 - 5.1 mmol/L 3.8  Chloride 101 - 111 mmol/L 103  CO2 22 - 32 mmol/L 28  Calcium 8.9 - 10.3 mg/dL 9.6  Total Protein 6.5 - 8.1 g/dL 8.3(H)  Total Bilirubin 0.3 - 1.2 mg/dL 0.5  Alkaline Phos 38 - 126 U/L 82  AST 15 - 41 U/L 20  ALT 14 - 54 U/L 12(L)   CBC Latest Ref Rng & Units 04/24/2017  WBC 3.6 - 11.0 K/uL 4.2  Hemoglobin 12.0 - 16.0 g/dL 13.0  Hematocrit 35.0 - 47.0 % 38.4  Platelets 150 - 440 K/uL 347     Assessment and plan- Patient is a 71 y.o. female with left breast ER+ DCIS on arimidex  Tolerating arimidex well. Continue for total period of 5 years. Continue calcium 1200 mg along with vitamin D 800 international units. Discuss results of bone density scan which shows osteopenia. Her 10 year  probability of a major osteoporotic fracture is less than 20% and that of a hip fracture with less than 50%. She therefore does not require adjuvant bisphosphonates therapy at this time  Leg edema- unrelated to arimidex. F/u with pcp  I will see her back in 4 months time with repeat CBC and cmp    Visit Diagnosis 1. Ductal carcinoma in situ (DCIS) of left breast      Dr. Randa Evens, MD, MPH [place] [facility] Pager- [phone] 06/05/2017 2:12 PM

## 2017-06-20 IMAGING — US US EXTREM LOW VENOUS BILAT
1 series · 13 of 24 positions shown · non-contrast
Comparison: None.

CLINICAL DATA: Bilateral lower extremity pain and edema, left
greater than right, for 1 week.



[Series 1: us extrem low venous bilat · 0.05mm/px · 13 of 62 slices shown]
[im 1/62]
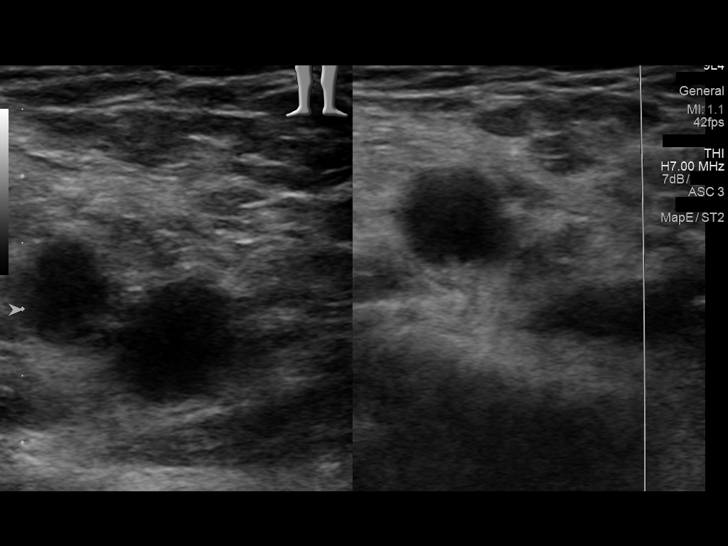
[im 6/62]
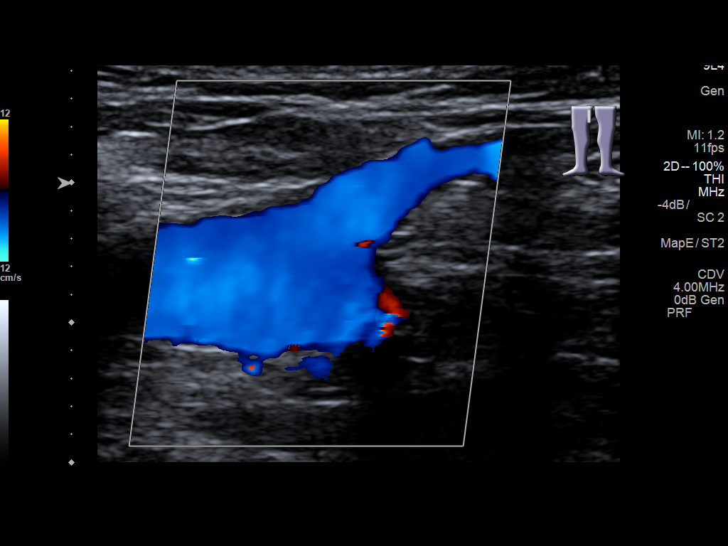
[im 11/62]
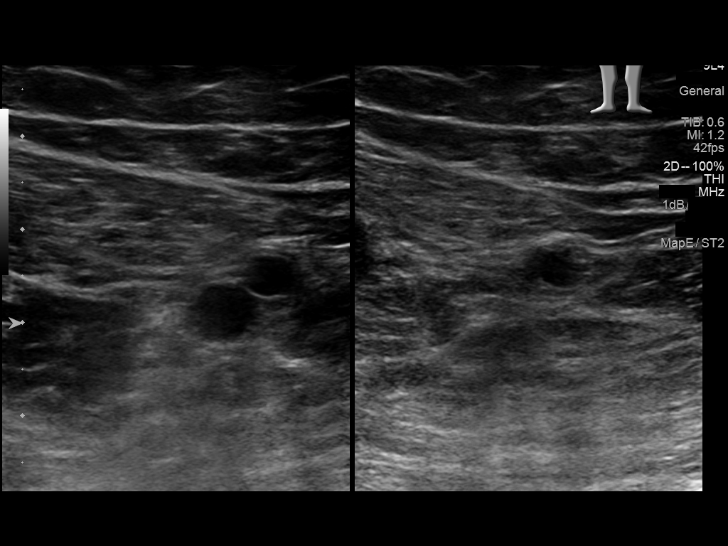
[im 16/62]
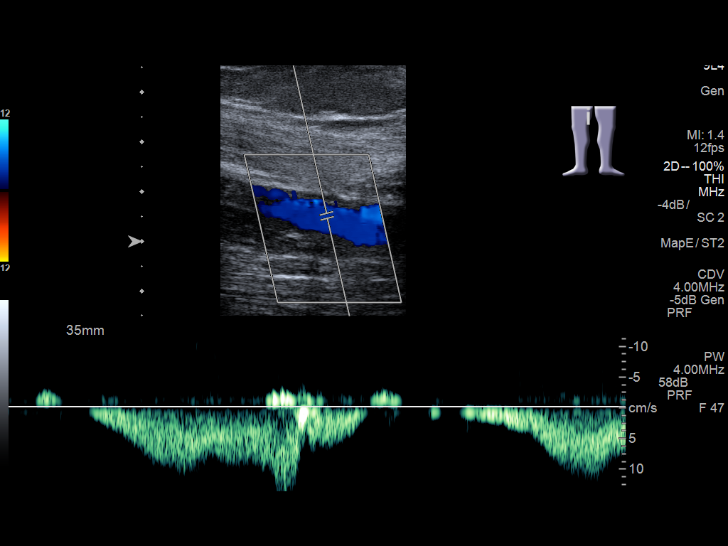
[im 22/62]
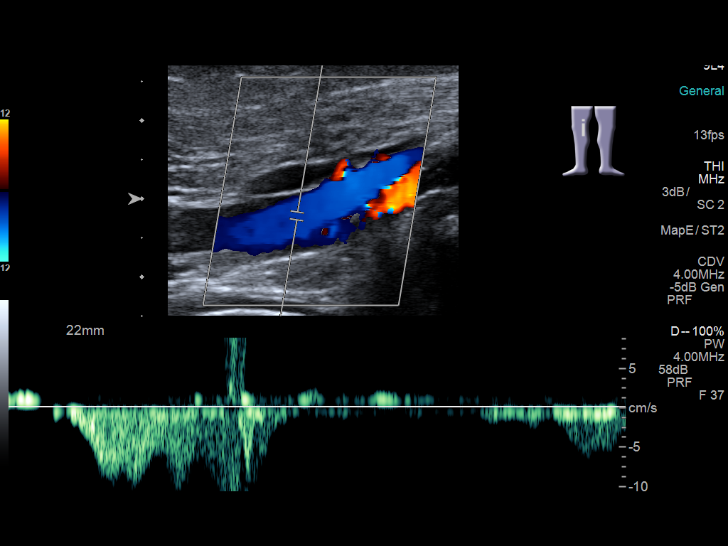
[im 27/62]
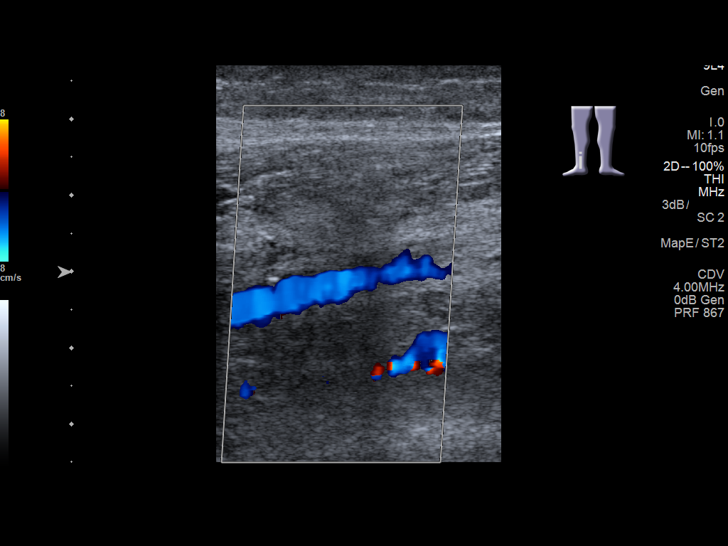
[im 32/62]
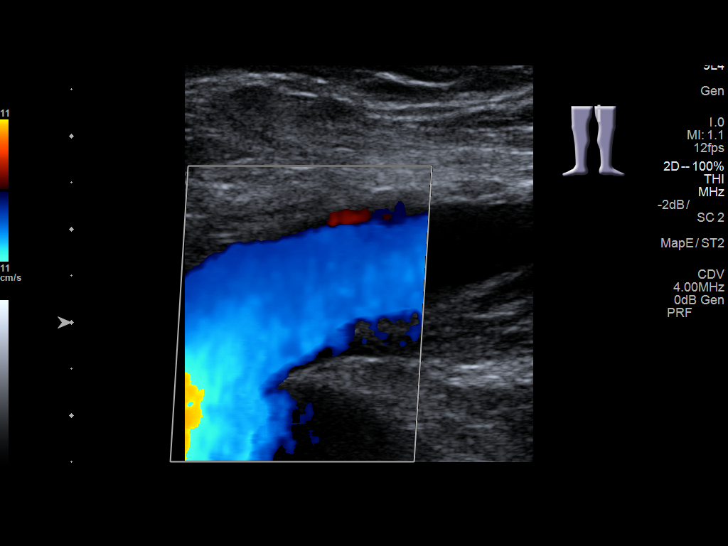
[im 35/62]
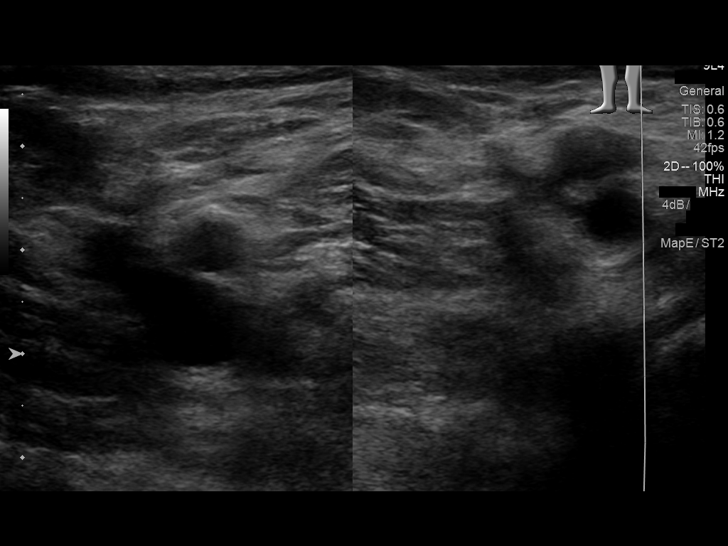
[im 40/62]
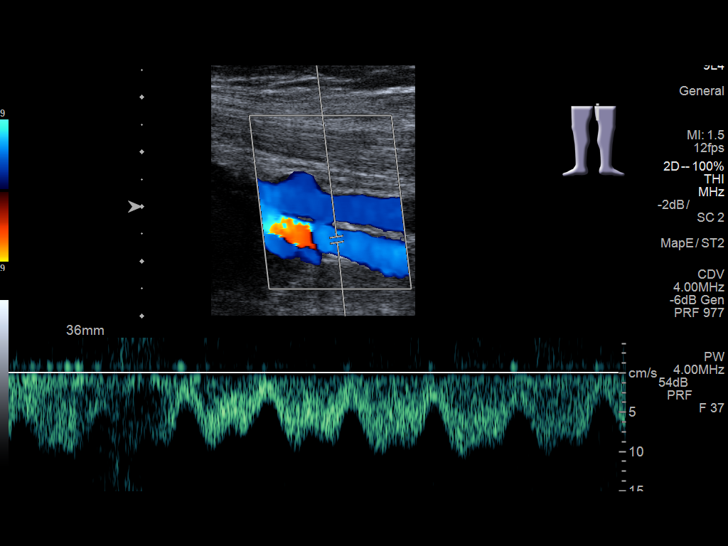
[im 46/62]
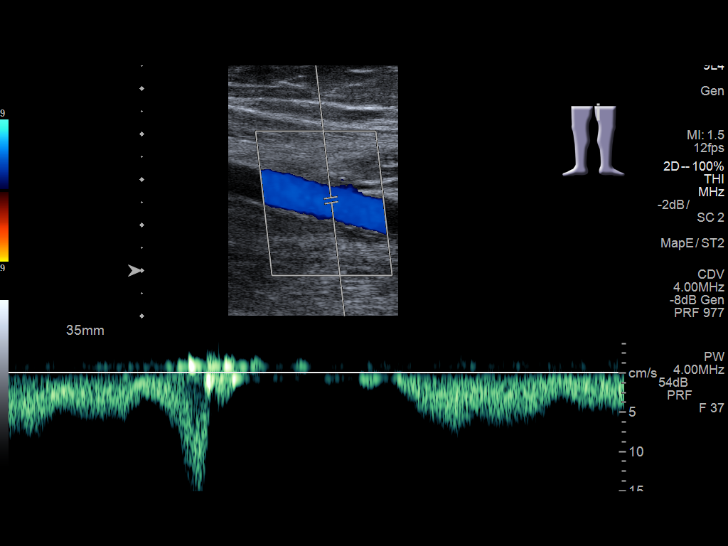
[im 51/62]
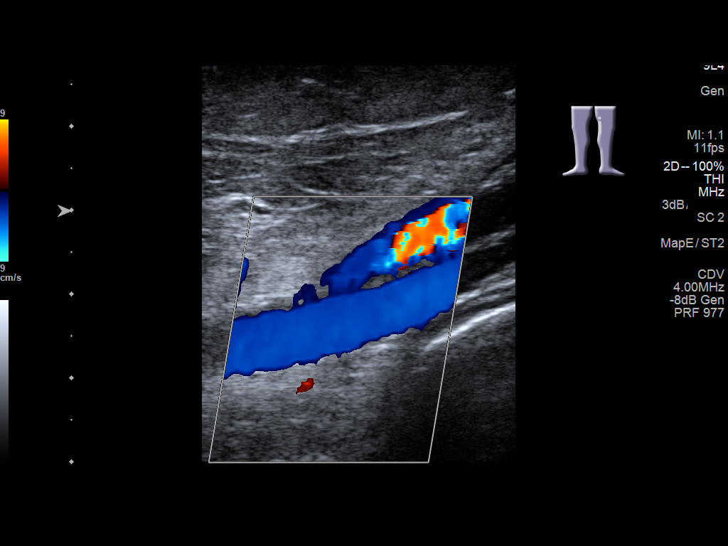
[im 56/62]
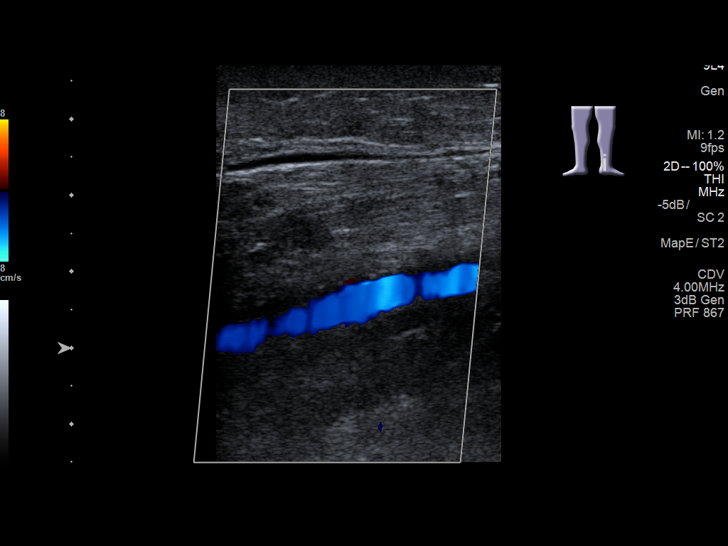
[im 62/62]
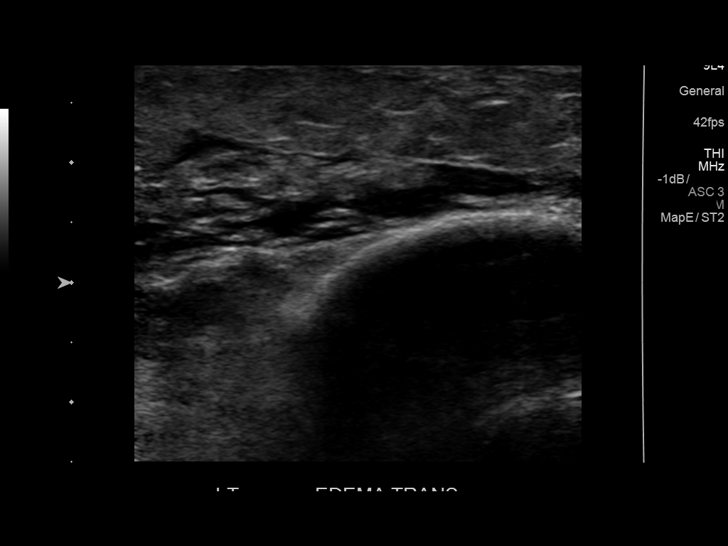

[13 of 24 positions shown; findings below may reference images not displayed]

FINDINGS: RIGHT LOWER EXTREMITY

Common Femoral Vein: No evidence of thrombus. Normal
compressibility, respiratory phasicity and response to augmentation.

Saphenofemoral Junction: No evidence of thrombus. Normal
compressibility and flow on color Doppler imaging.

Profunda Femoral Vein: No evidence of thrombus. Normal
compressibility and flow on color Doppler imaging.

Femoral Vein: No evidence of thrombus. Normal compressibility,
respiratory phasicity and response to augmentation.

Popliteal Vein: No evidence of thrombus. Normal compressibility,
respiratory phasicity and response to augmentation.

Calf Veins: No evidence of thrombus. Normal compressibility and flow
on color Doppler imaging.

Superficial Great Saphenous Vein: No evidence of thrombus. Normal
compressibility and flow on color Doppler imaging.

Venous Reflux:  None.

Other Findings:  None.

LEFT LOWER EXTREMITY

Common Femoral Vein: No evidence of thrombus. Normal
compressibility, respiratory phasicity and response to augmentation.

Saphenofemoral Junction: No evidence of thrombus. Normal
compressibility and flow on color Doppler imaging.

Profunda Femoral Vein: No evidence of thrombus. Normal
compressibility and flow on color Doppler imaging.

Femoral Vein: No evidence of thrombus. Normal compressibility,
respiratory phasicity and response to augmentation.

Popliteal Vein: No evidence of thrombus. Normal compressibility,
respiratory phasicity and response to augmentation.

Calf Veins: No evidence of thrombus. Normal compressibility and flow
on color Doppler imaging.

Superficial Great Saphenous Vein: No evidence of thrombus. Normal
compressibility and flow on color Doppler imaging.

Venous Reflux:  None.

Other Findings:  Subcutaneous edema is noted at the left ankle.
IMPRESSION: No evidence of DVT within either lower extremity.

These results will be called to the ordering clinician or
representative by the [HOSPITAL] at the imaging location.

## 2017-08-31 ENCOUNTER — Other Ambulatory Visit: Payer: Self-pay | Admitting: Internal Medicine

## 2017-08-31 DIAGNOSIS — K219 Gastro-esophageal reflux disease without esophagitis: Secondary | ICD-10-CM

## 2017-08-31 DIAGNOSIS — E782 Mixed hyperlipidemia: Secondary | ICD-10-CM

## 2017-08-31 DIAGNOSIS — I10 Essential (primary) hypertension: Secondary | ICD-10-CM

## 2017-09-23 ENCOUNTER — Encounter: Payer: Self-pay | Admitting: Internal Medicine

## 2017-09-23 ENCOUNTER — Ambulatory Visit (INDEPENDENT_AMBULATORY_CARE_PROVIDER_SITE_OTHER): Payer: Medicare Other | Admitting: Internal Medicine

## 2017-09-23 VITALS — BP 132/80 | HR 84 | Ht 68.0 in | Wt 142.0 lb

## 2017-09-23 DIAGNOSIS — N183 Chronic kidney disease, stage 3 (moderate): Secondary | ICD-10-CM

## 2017-09-23 DIAGNOSIS — I10 Essential (primary) hypertension: Secondary | ICD-10-CM | POA: Diagnosis not present

## 2017-09-23 DIAGNOSIS — E1169 Type 2 diabetes mellitus with other specified complication: Secondary | ICD-10-CM

## 2017-09-23 DIAGNOSIS — E782 Mixed hyperlipidemia: Secondary | ICD-10-CM | POA: Diagnosis not present

## 2017-09-23 DIAGNOSIS — E1122 Type 2 diabetes mellitus with diabetic chronic kidney disease: Secondary | ICD-10-CM | POA: Diagnosis not present

## 2017-09-23 DIAGNOSIS — E785 Hyperlipidemia, unspecified: Secondary | ICD-10-CM

## 2017-09-23 DIAGNOSIS — K219 Gastro-esophageal reflux disease without esophagitis: Secondary | ICD-10-CM

## 2017-09-23 MED ORDER — METOPROLOL SUCCINATE ER 25 MG PO TB24: 25.0000 mg | ORAL_TABLET | Freq: Two times a day (BID) | ORAL | 1 refills | Status: DC

## 2017-09-23 MED ORDER — ATORVASTATIN CALCIUM 10 MG PO TABS: 10.0000 mg | ORAL_TABLET | Freq: Every day | ORAL | 1 refills | Status: DC

## 2017-09-23 MED ORDER — OMEPRAZOLE 20 MG PO CPDR: 20.0000 mg | DELAYED_RELEASE_CAPSULE | Freq: Every day | ORAL | 1 refills | Status: DC

## 2017-09-23 NOTE — Progress Notes (Signed)
Date:  09/23/2017   Name:  Brandy Diaz   DOB:  1946/07/09   MRN:  [id number]   Chief Complaint: Hypertension (Need refills on meds. ) and Diabetes Hypertension  This is a chronic problem. The problem is unchanged. The problem is controlled. Pertinent negatives include no chest pain, headaches, palpitations or shortness of breath.  Diabetes  Pertinent negatives for hypoglycemia include no dizziness, headaches or tremors. Pertinent negatives for diabetes include no chest pain, no fatigue, no polydipsia and no polyuria. Current diabetic treatment includes diet. She monitors urine at home 1-2 x per week. Blood glucose monitoring compliance is good. Her breakfast blood glucose range is generally 110-130 mg/dl.  Gastroesophageal Reflux  She reports no abdominal pain, no chest pain or no coughing. This is a recurrent problem. The problem occurs rarely. Pertinent negatives include no fatigue. She has tried a PPI for the symptoms. The treatment provided significant relief.      Review of Systems  Constitutional: Negative for appetite change, fatigue, fever and unexpected weight change.  HENT: Negative for tinnitus and trouble swallowing.   Eyes: Negative for visual disturbance.  Respiratory: Negative for cough, chest tightness and shortness of breath.   Cardiovascular: Negative for chest pain, palpitations and leg swelling.  Gastrointestinal: Negative for abdominal pain.  Endocrine: Negative for polydipsia and polyuria.  Genitourinary: Negative for dysuria and hematuria.  Musculoskeletal: Positive for gait problem (has hard callus on ball of right foot). Negative for arthralgias.  Skin: Negative for color change and rash.  Neurological: Negative for dizziness, tremors, numbness and headaches.  Psychiatric/Behavioral: Negative for dysphoric mood and sleep disturbance.    Patient Active Problem List   Diagnosis Date Noted  . Ductal carcinoma in situ (DCIS) of left breast 05/21/2017  .  Localized edema 04/28/2017  . Essential (primary) hypertension 07/24/2015  . Gastro-esophageal reflux disease without esophagitis 07/24/2015  . Hyperlipidemia associated with type 2 diabetes mellitus ([place]) 07/24/2015  . Type 2 diabetes mellitus with stage 3 chronic kidney disease, without long-term current use of insulin ([place]) 07/24/2015  . Unexplained weight loss 07/24/2015  . Fibrocystic breast changes 07/24/2015    Prior to Admission medications   Medication Sig Start Date End Date Taking? Authorizing Provider  ACCU-CHEK SOFTCLIX LANCETS lancets 1 each by Other route 2 (two) times daily. Use as instructed 09/23/16  Yes Glean Hess, MD  anastrozole (ARIMIDEX) 1 MG tablet Take 1 tablet (1 mg total) by mouth daily. 05/10/17  Yes Sindy Guadeloupe, MD  aspirin EC 81 MG tablet Take 81 mg by mouth daily as needed (for sinus or pain).    Yes [provider]  atorvastatin (LIPITOR) 10 MG tablet TAKE 1 TABLET BY MOUTH  DAILY 08/31/17  Yes Glean Hess, MD  fluticasone [facility]) 50 MCG/ACT nasal spray Place 2 sprays into both nostrils daily. 01/19/17  Yes Glean Hess, MD  glucose blood (ACCU-CHEK AVIVA PLUS) test strip Use to test blood sugar daily 01/19/17  Yes Glean Hess, MD  metoprolol succinate (TOPROL-XL) 25 MG 24 hr tablet TAKE 1 TABLET BY MOUTH TWO  TIMES DAILY 08/31/17  Yes Glean Hess, MD  Multiple Vitamins-Calcium (ONE-A-DAY WOMENS PO) Take 1 tablet by mouth every other day.   Yes [provider]  omeprazole (PRILOSEC) 20 MG capsule TAKE 1 CAPSULE BY MOUTH  DAILY 08/31/17  Yes Glean Hess, MD    Allergies  Allergen Reactions  . Ace Inhibitors Other (See Comments)    Cough  .  Hydrochlorothiazide Other (See Comments)    Dizziness  . Triamterene-Hctz Other (See Comments)    Appetite loss    Past Surgical History:  Procedure Laterality Date  . ABDOMINAL HYSTERECTOMY  1980  . BREAST SURGERY Left    Breast Biopsy  . COLONOSCOPY    .  EYE SURGERY Right    Cataract Extraction with IOL    Social History   Tobacco Use  . Smoking status: Never Smoker  . Smokeless tobacco: Never Used  Substance Use Topics  . Alcohol use: No    Alcohol/week: 0.0 oz  . Drug use: No     Medication list has been reviewed and updated.  PHQ 2/9 Scores 01/19/2017 12/31/2015 07/25/2015  PHQ - 2 Score 2 0 2  PHQ- 9 Score 7 - 5    Physical Exam  Constitutional: She is oriented to person, place, and time. She appears well-developed. No distress.  HENT:  Head: Normocephalic and atraumatic.  Neck: Normal range of motion. Neck supple.  Cardiovascular: Normal rate, regular rhythm and normal heart sounds.  Pulmonary/Chest: Effort normal and breath sounds normal. No respiratory distress. She has no wheezes. She has no rhonchi.  Musculoskeletal: Normal range of motion.  Neurological: She is alert and oriented to person, place, and time. She has normal strength. No sensory deficit.  Skin: Skin is warm and dry. No rash noted.  Nodule on ball of right foot base of 4th toe c/w plantar wart  Psychiatric: She has a normal mood and affect. Her behavior is normal. Thought content normal.  Nursing note and vitals reviewed.   BP 132/80   Pulse 84   Ht 5\' 8"  (1.727 m)   Wt 142 lb (64.4 kg)   SpO2 99%   BMI 21.59 kg/m   Assessment and Plan: 1. Essential (primary) hypertension controlled - metoprolol succinate (TOPROL-XL) 25 MG 24 hr tablet; Take 1 tablet (25 mg total) 2 (two) times daily by mouth.  Dispense: 180 tablet; Refill: 1  2. Type 2 diabetes mellitus with stage 3 chronic kidney disease, without long-term current use of insulin (HCC) Continue diet control Recommend annual eye exam - Hemoglobin A1c - Comprehensive metabolic panel  3. Hyperlipidemia associated with type 2 diabetes mellitus ([place]) On statin therapy  4. Gastro-esophageal reflux disease without esophagitis stable - omeprazole (PRILOSEC) 20 MG capsule; Take 1 capsule (20  mg total) daily by mouth.  Dispense: 90 capsule; Refill: 1   Meds ordered this encounter  Medications  . omeprazole (PRILOSEC) 20 MG capsule    Sig: Take 1 capsule (20 mg total) daily by mouth.    Dispense:  90 capsule    Refill:  1  . metoprolol succinate (TOPROL-XL) 25 MG 24 hr tablet    Sig: Take 1 tablet (25 mg total) 2 (two) times daily by mouth.    Dispense:  180 tablet    Refill:  1  . atorvastatin (LIPITOR) 10 MG tablet    Sig: Take 1 tablet (10 mg total) daily by mouth.    Dispense:  90 tablet    Refill:  1    Partially dictated using Editor, commissioning. Any errors are unintentional.  Halina Maidens, MD [facility]  09/23/2017

## 2017-09-24 LAB — COMPREHENSIVE METABOLIC PANEL
ALBUMIN: 4.1 g/dL (ref 3.5–4.8)
ALT: 10 IU/L (ref 0–32)
AST: 17 IU/L (ref 0–40)
Albumin/Globulin Ratio: 1.2 (ref 1.2–2.2)
Alkaline Phosphatase: 88 IU/L (ref 39–117)
BUN / CREAT RATIO: 11 — AB (ref 12–28)
BUN: 13 mg/dL (ref 8–27)
Bilirubin Total: 0.3 mg/dL (ref 0.0–1.2)
CALCIUM: 9.8 mg/dL (ref 8.7–10.3)
CO2: 25 mmol/L (ref 20–29)
CREATININE: 1.2 mg/dL — AB (ref 0.57–1.00)
Chloride: 99 mmol/L (ref 96–106)
GFR, EST AFRICAN AMERICAN: 52 mL/min/{1.73_m2} — AB (ref >59)
GFR, EST NON AFRICAN AMERICAN: 45 mL/min/{1.73_m2} — AB (ref >59)
GLOBULIN, TOTAL: 3.3 g/dL (ref 1.5–4.5)
Glucose: 122 mg/dL — ABNORMAL HIGH (ref 65–99)
Potassium: 4.3 mmol/L (ref 3.5–5.2)
Sodium: 140 mmol/L (ref 134–144)
TOTAL PROTEIN: 7.4 g/dL (ref 6.0–8.5)

## 2017-09-24 LAB — HEMOGLOBIN A1C
Est. average glucose Bld gHb Est-mCnc: 131 mg/dL
Hgb A1c MFr Bld: 6.2 % — ABNORMAL HIGH (ref 4.8–5.6)

## 2017-10-06 ENCOUNTER — Inpatient Hospital Stay: Payer: Medicare Other | Attending: Oncology | Admitting: Oncology

## 2017-10-06 ENCOUNTER — Inpatient Hospital Stay: Payer: Medicare Other

## 2017-10-06 ENCOUNTER — Encounter: Payer: Self-pay | Admitting: Oncology

## 2017-10-06 VITALS — BP 179/82 | HR 60 | Temp 97.0°F | Wt 142.0 lb

## 2017-10-06 DIAGNOSIS — K219 Gastro-esophageal reflux disease without esophagitis: Secondary | ICD-10-CM | POA: Diagnosis not present

## 2017-10-06 DIAGNOSIS — Z170 Estrogen receptor positive status [ER+]: Secondary | ICD-10-CM | POA: Diagnosis not present

## 2017-10-06 DIAGNOSIS — D0512 Intraductal carcinoma in situ of left breast: Secondary | ICD-10-CM | POA: Insufficient documentation

## 2017-10-06 DIAGNOSIS — I10 Essential (primary) hypertension: Secondary | ICD-10-CM | POA: Insufficient documentation

## 2017-10-06 DIAGNOSIS — E119 Type 2 diabetes mellitus without complications: Secondary | ICD-10-CM | POA: Diagnosis not present

## 2017-10-06 DIAGNOSIS — Z79899 Other long term (current) drug therapy: Secondary | ICD-10-CM | POA: Diagnosis not present

## 2017-10-06 DIAGNOSIS — Z79811 Long term (current) use of aromatase inhibitors: Secondary | ICD-10-CM | POA: Diagnosis not present

## 2017-10-06 DIAGNOSIS — M7989 Other specified soft tissue disorders: Secondary | ICD-10-CM | POA: Diagnosis not present

## 2017-10-06 DIAGNOSIS — M8580 Other specified disorders of bone density and structure, unspecified site: Secondary | ICD-10-CM | POA: Insufficient documentation

## 2017-10-06 DIAGNOSIS — Z7982 Long term (current) use of aspirin: Secondary | ICD-10-CM | POA: Insufficient documentation

## 2017-10-06 LAB — CBC WITH DIFFERENTIAL/PLATELET
BASOS ABS: 0.1 10*3/uL (ref 0–0.1)
Basophils Relative: 2 %
EOS PCT: 6 %
Eosinophils Absolute: 0.3 10*3/uL (ref 0–0.7)
HCT: 37.7 % (ref 35.0–47.0)
Hemoglobin: 12.5 g/dL (ref 12.0–16.0)
LYMPHS ABS: 0.9 10*3/uL — AB (ref 1.0–3.6)
LYMPHS PCT: 22 %
MCH: 29.7 pg (ref 26.0–34.0)
MCHC: 33.1 g/dL (ref 32.0–36.0)
MCV: 89.8 fL (ref 80.0–100.0)
MONO ABS: 0.4 10*3/uL (ref 0.2–0.9)
Monocytes Relative: 10 %
Neutro Abs: 2.6 10*3/uL (ref 1.4–6.5)
Neutrophils Relative %: 60 %
PLATELETS: 323 10*3/uL (ref 150–440)
RBC: 4.2 MIL/uL (ref 3.80–5.20)
RDW: 13.7 % (ref 11.5–14.5)
WBC: 4.2 10*3/uL (ref 3.6–11.0)

## 2017-10-06 LAB — COMPREHENSIVE METABOLIC PANEL
ALBUMIN: 4 g/dL (ref 3.5–5.0)
ALK PHOS: 71 U/L (ref 38–126)
ALT: 15 U/L (ref 14–54)
AST: 21 U/L (ref 15–41)
Anion gap: 8 (ref 5–15)
BILIRUBIN TOTAL: 0.5 mg/dL (ref 0.3–1.2)
BUN: 11 mg/dL (ref 6–20)
CO2: 28 mmol/L (ref 22–32)
CREATININE: 1.05 mg/dL — AB (ref 0.44–1.00)
Calcium: 9.4 mg/dL (ref 8.9–10.3)
Chloride: 101 mmol/L (ref 101–111)
GFR calc Af Amer: 60 mL/min — ABNORMAL LOW (ref >60)
GFR calc non Af Amer: 52 mL/min — ABNORMAL LOW (ref >60)
GLUCOSE: 113 mg/dL — AB (ref 65–99)
POTASSIUM: 4.1 mmol/L (ref 3.5–5.1)
Sodium: 137 mmol/L (ref 135–145)
TOTAL PROTEIN: 8 g/dL (ref 6.5–8.1)

## 2017-10-06 NOTE — Progress Notes (Signed)
Patient here for follow up with labs today. She states that she is feeling well and denies having any pain. She continues to take the Arimidex without any problems. Her BP was elevated today and rechecked twice.

## 2017-10-06 NOTE — Progress Notes (Signed)
Hematology/Oncology Consult note [facility]  Telephone:([phone] [phone]  Patient Care Team: Glean Hess, MD as PCP - General (Internal Medicine)   Name of the patient: Brandy Diaz  [id number]  04-13-1946   Date of visit: 10/06/17  Diagnosis- left breast ER+ DCIS  Chief complaint/ Reason for visit- f/u of breast cancer on arimidex  Heme/Onc history:  Patient is a 71 year old female with a past medical history significant for hypertension and diet-controlled diabetes she recently underwent screening mammogram which showed an abnormal mass in the left breast 3 cm from the nipple 1 x 0.9 x 0.3 cm in size and mobile mass at 4:00 position 0.8 x 0.7 x 0.5 cm in size. Evaluation of axilla was negative for adenopathy. This was followed by ultrasound and the mass at the 5:30 position was stable long-term and consistent with benign lesion and core biopsy of the 4:00 position mass was recommended.  2. 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   3. 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 No necrosis was identified. Margins were negative for DCIS 8 mm from the deep margin. ER 5190% positive PR negative  4. Patient lives with her husband and is independent of her ADLs and IADLs. Menarche at 35 years. She does not have any children. She never breast-fed and was never on hormone replacement therapy. She has a remote history of hysterectomy probably secondary to fibroids. She has been having on and off leg swelling but it has been particularly worse over the last 1 week and her swelling has not subsided. Denies any shortness of breath  5. Recent bone density scan in June 2018 showed T score of -2.2 consistent with osteopenia. 10 year probability of a major osteoporotic fracture was 6% and hip  fracture was 1.5%. Patient started on arimidex in June 2018     Interval history-she is tolerating her Arimidex well and reports no fatigue, hot flashes or arthralgias.  She is also been taking calcium and vitamin D twice a day  ECOG PS- 0 Pain scale- 0 Opioid associated constipation- no  Review of systems- Review of Systems  Constitutional: Negative for chills, fever, malaise/fatigue and weight loss.  HENT: Negative for congestion, ear discharge and nosebleeds.   Eyes: Negative for blurred vision.  Respiratory: Negative for cough, hemoptysis, sputum production, shortness of breath and wheezing.   Cardiovascular: Negative for chest pain, palpitations, orthopnea and claudication.  Gastrointestinal: Negative for abdominal pain, blood in stool, constipation, diarrhea, heartburn, melena, nausea and vomiting.  Genitourinary: Negative for dysuria, flank pain, frequency, hematuria and urgency.  Musculoskeletal: Negative for back pain, joint pain and myalgias.  Skin: Negative for rash.  Neurological: Negative for dizziness, tingling, focal weakness, seizures, weakness and headaches.  Endo/Heme/Allergies: Does not bruise/bleed easily.  Psychiatric/Behavioral: Negative for depression and suicidal ideas. The patient does not have insomnia.       Allergies  Allergen Reactions  . Ace Inhibitors Other (See Comments)    Cough  . Hydrochlorothiazide Other (See Comments)    Dizziness  . Triamterene-Hctz Other (See Comments)    Appetite loss     Past Medical History:  Diagnosis Date  . Anemia   . Diabetes mellitus without complication (HCC)    diet controlled  . Edema    ankles  . GERD (gastroesophageal reflux disease)   .  Hypertension      Past Surgical History:  Procedure Laterality Date  . ABDOMINAL HYSTERECTOMY  1980  . BREAST LUMPECTOMY WITH NEEDLE LOCALIZATION Left 04/07/2017   Procedure: BREAST LUMPECTOMY WITH NEEDLE LOCALIZATION;  Surgeon: Vickie Epley, MD;  Location:  [facility] ORS;  Service: General;  Laterality: Left;  . BREAST SURGERY Left    Breast Biopsy  . CATARACT EXTRACTION W/PHACO Right 06/12/2016   Procedure: CATARACT EXTRACTION PHACO AND INTRAOCULAR LENS PLACEMENT (IOC);  Surgeon: Eulogio Bear, MD;  Location: [facility] ORS;  Service: Ophthalmology;  Laterality: Right;  [place] 6.1 AP% 12.7 CDE .77 FLUID PACK LOT # [id number]  . COLONOSCOPY    . EYE SURGERY Right    Cataract Extraction with IOL    Social History   Socioeconomic History  . Marital status: Married    Spouse name: Not on file  . Number of children: Not on file  . Years of education: Not on file  . Highest education level: Not on file  Social Needs  . Financial resource strain: Not on file  . Food insecurity - worry: Not on file  . Food insecurity - inability: Not on file  . Transportation needs - medical: Not on file  . Transportation needs - non-medical: Not on file  Occupational History  . Not on file  Tobacco Use  . Smoking status: Never Smoker  . Smokeless tobacco: Never Used  Substance and Sexual Activity  . Alcohol use: No    Alcohol/week: 0.0 oz  . Drug use: No  . Sexual activity: Not on file  Other Topics Concern  . Not on file  Social History Narrative  . Not on file    Family History  Problem Relation Age of Onset  . Breast cancer Mother 41  . Diabetes Mother   . Hypertension Mother   . CAD Father   . Breast cancer Sister   . Hypertension Sister   . Breast cancer Maternal Aunt 10  . Throat cancer Brother      Current Outpatient Medications:  .  ACCU-CHEK SOFTCLIX LANCETS lancets, 1 each by Other route 2 (two) times daily. Use as instructed, Disp: 100 each, Rfl: 12 .  anastrozole (ARIMIDEX) 1 MG tablet, Take 1 tablet (1 mg total) by mouth daily., Disp: 30 tablet, Rfl: 3 .  aspirin EC 81 MG tablet, Take 81 mg by mouth daily as needed (for sinus or pain). , Disp: , Rfl:  .  atorvastatin (LIPITOR) 10 MG tablet, Take 1 tablet (10 mg total) daily by mouth.,  Disp: 90 tablet, Rfl: 1 .  fluticasone (FLONASE) 50 MCG/ACT nasal spray, Place 2 sprays into both nostrils daily., Disp: 48 g, Rfl: 3 .  glucose blood (ACCU-CHEK AVIVA PLUS) test strip, Use to test blood sugar daily, Disp: 100 each, Rfl: 12 .  metoprolol succinate (TOPROL-XL) 25 MG 24 hr tablet, Take 1 tablet (25 mg total) 2 (two) times daily by mouth., Disp: 180 tablet, Rfl: 1 .  Multiple Vitamins-Calcium (ONE-A-DAY WOMENS PO), Take 1 tablet by mouth every other day., Disp: , Rfl:  .  omeprazole (PRILOSEC) 20 MG capsule, Take 1 capsule (20 mg total) daily by mouth., Disp: 90 capsule, Rfl: 1  Physical exam:  Vitals:   10/06/17 1347 10/06/17 1350  BP: (!) 177/81 (!) 179/82  Pulse: 60   Temp: (!) 97 F (36.1 C)   TempSrc: Tympanic   Weight: 142 lb (64.4 kg)    Physical Exam  Constitutional: She is oriented to person,  place, and time and well-developed, well-nourished, and in no distress.  HENT:  Head: Normocephalic and atraumatic.  Eyes: EOM are normal. Pupils are equal, round, and reactive to light.  Neck: Normal range of motion.  Cardiovascular: Normal rate, regular rhythm and normal heart sounds.  Pulmonary/Chest: Effort normal and breath sounds normal.  Abdominal: Soft. Bowel sounds are normal.  Neurological: She is alert and oriented to person, place, and time.  Skin: Skin is warm and dry.   Breast exam was performed in seated and lying down position. Patient is status post left lumpectomy with a well-healed surgical scar. No evidence of any palpable masses. No evidence of axillary adenopathy. No evidence of any palpable masses or lumps in the right breast. No evidence of right axillary adenopathy   CMP Latest Ref Rng & Units 09/23/2017  Glucose 65 - 99 mg/dL 122(H)  BUN 8 - 27 mg/dL 13  Creatinine 0.57 - 1.00 mg/dL 1.20(H)  Sodium 134 - 144 mmol/L 140  Potassium 3.5 - 5.2 mmol/L 4.3  Chloride 96 - 106 mmol/L 99  CO2 20 - 29 mmol/L 25  Calcium 8.7 - 10.3 mg/dL 9.8  Total  Protein 6.0 - 8.5 g/dL 7.4  Total Bilirubin 0.0 - 1.2 mg/dL 0.3  Alkaline Phos 39 - 117 IU/L 88  AST 0 - 40 IU/L 17  ALT 0 - 32 IU/L 10   CBC Latest Ref Rng & Units 06/05/2017  WBC 3.6 - 11.0 K/uL 4.6  Hemoglobin 12.0 - 16.0 g/dL 12.7  Hematocrit 35.0 - 47.0 % 37.3  Platelets 150 - 440 K/uL 338    Assessment and plan- Patient is a 71 y.o. female with left breast ER+ DCIS on arimidex  Patient is tolerating her Arimidex well and will continue to take that for a total period of 5 years.  She is due for a repeat mammogram in April 2018 which will be coordinated by Dr. Rosana Hoes.  I will see her back in 6 months.  She follows up with Dr. Army Melia and gets her labs checked through her so I would not be repeating any labs at this time     Visit Diagnosis 1. Ductal carcinoma in situ (DCIS) of left breast      Dr. Randa Evens, MD, MPH [facility] Pager- [phone] 10/06/2017 2:17 PM

## 2017-11-01 IMAGING — MG MM PLC BREAST LOC DEV 1ST LESION INC MAMMO GUIDE*L*
6 series · 6 of 6 positions shown · non-contrast
Comparison: Previous exams.

CLINICAL DATA: 71-year-old female presenting for wire localization
of a left breast mass prior to surgical excision.

EXAM:
NEEDLE LOCALIZATION OF THE LEFT BREAST WITH MAMMO GUIDANCE

[L ML (1 of 4)]
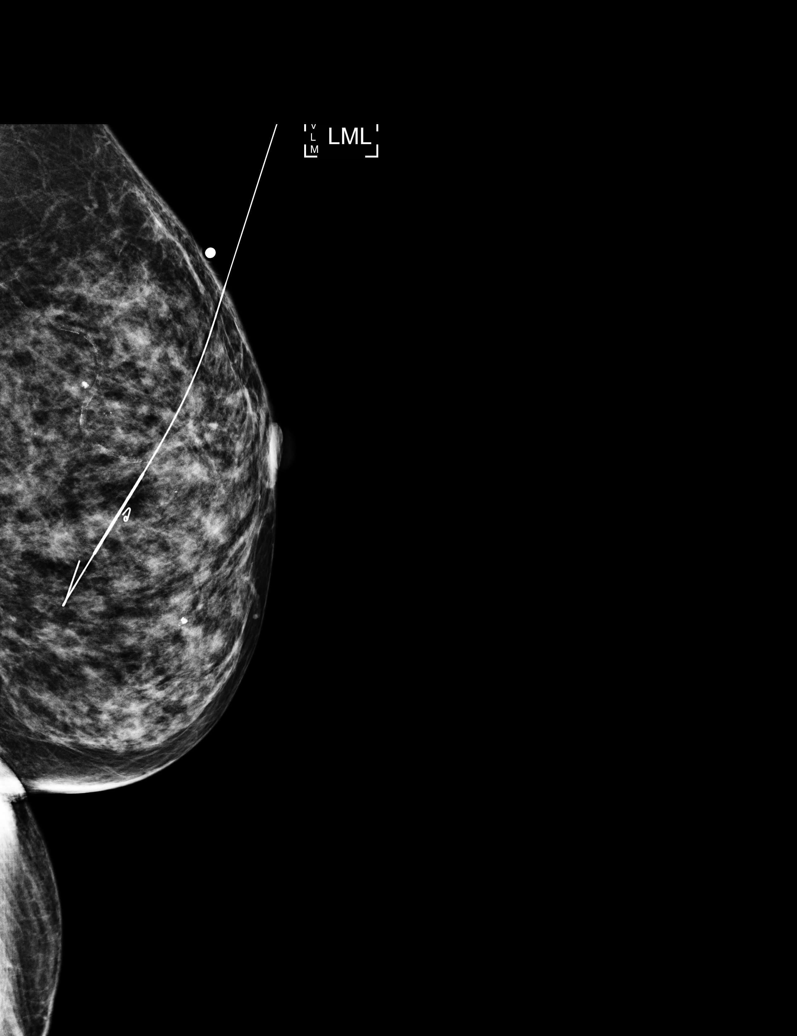

[L ML (2 of 4)]
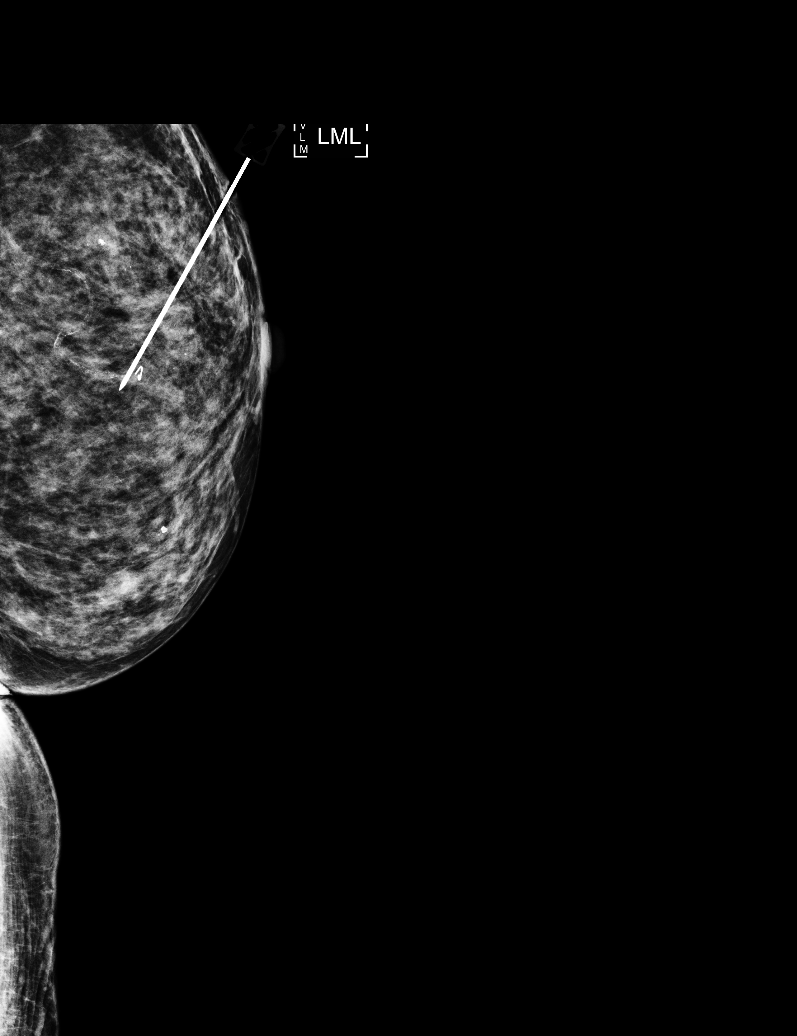

[L ML (3 of 4)]
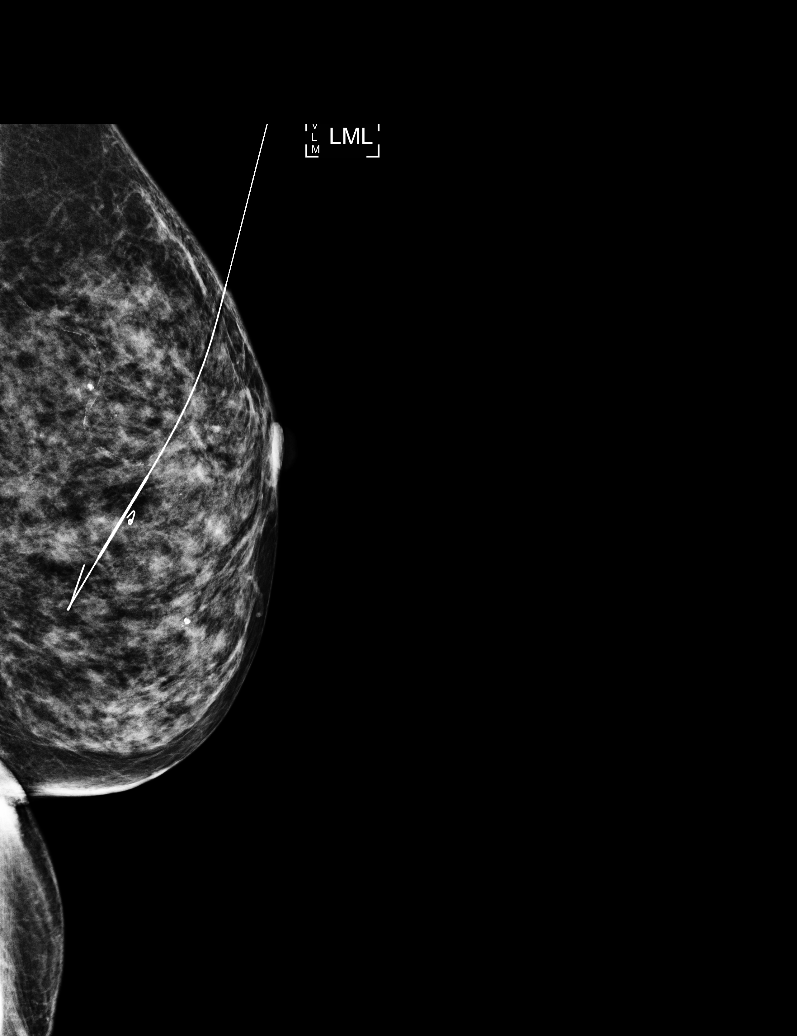

[L ML (4 of 4)]
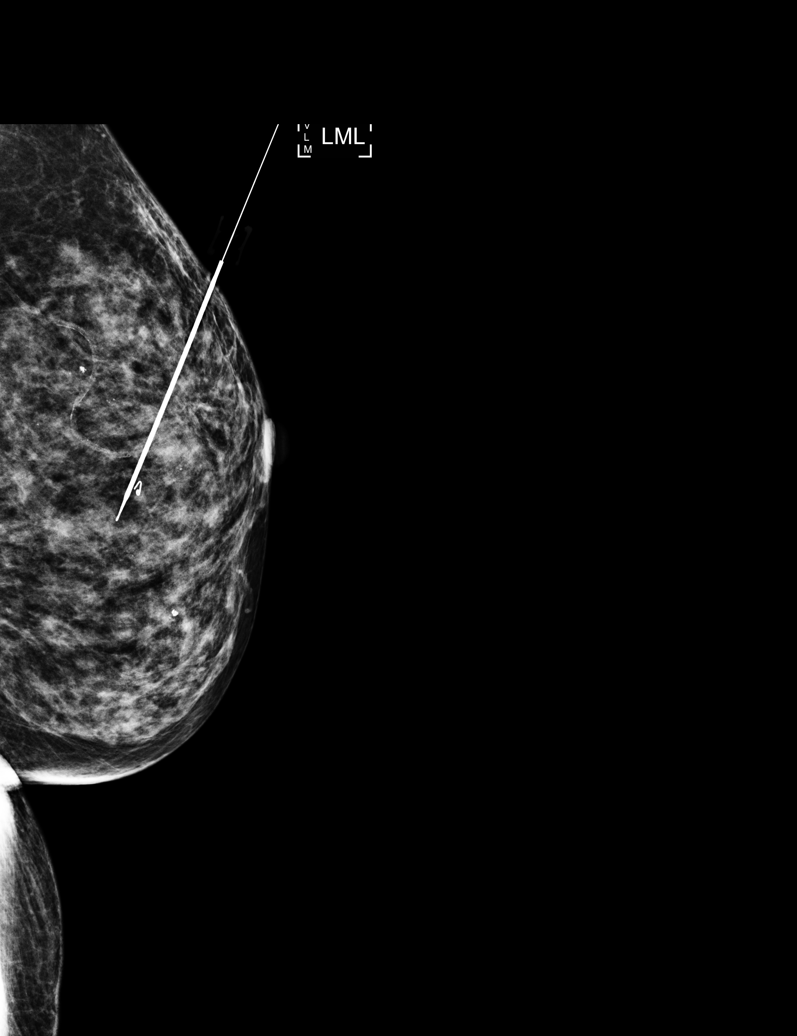

[L CC (1 of 2)]
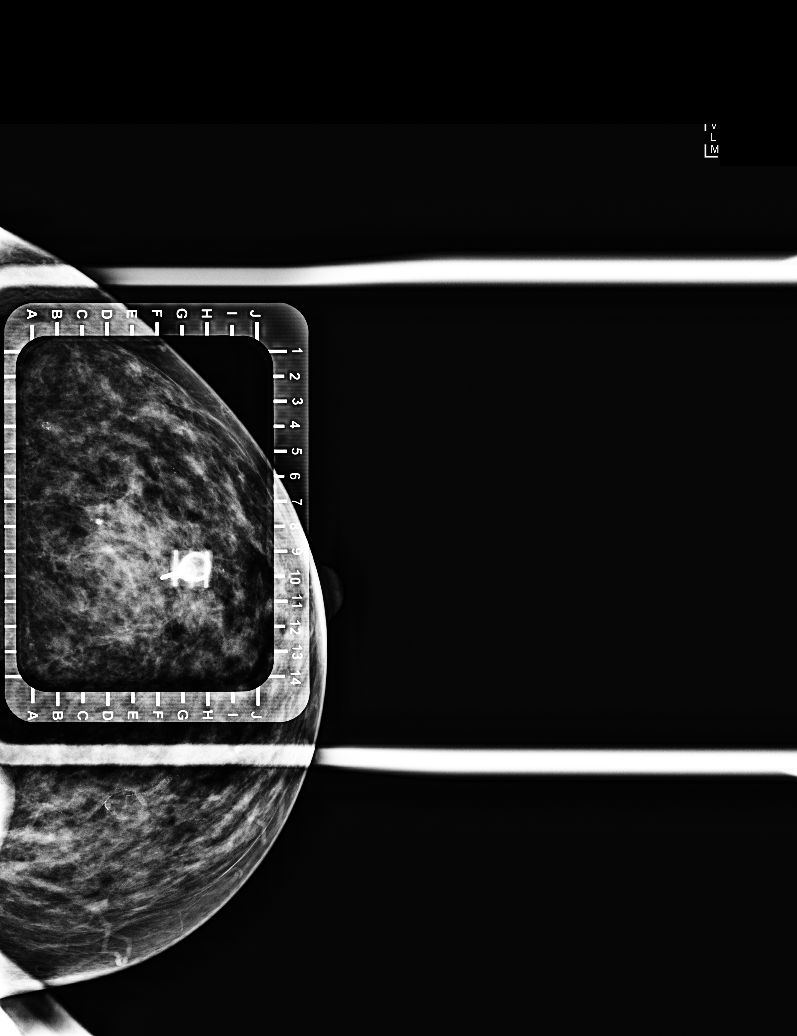

[L CC (2 of 2)]
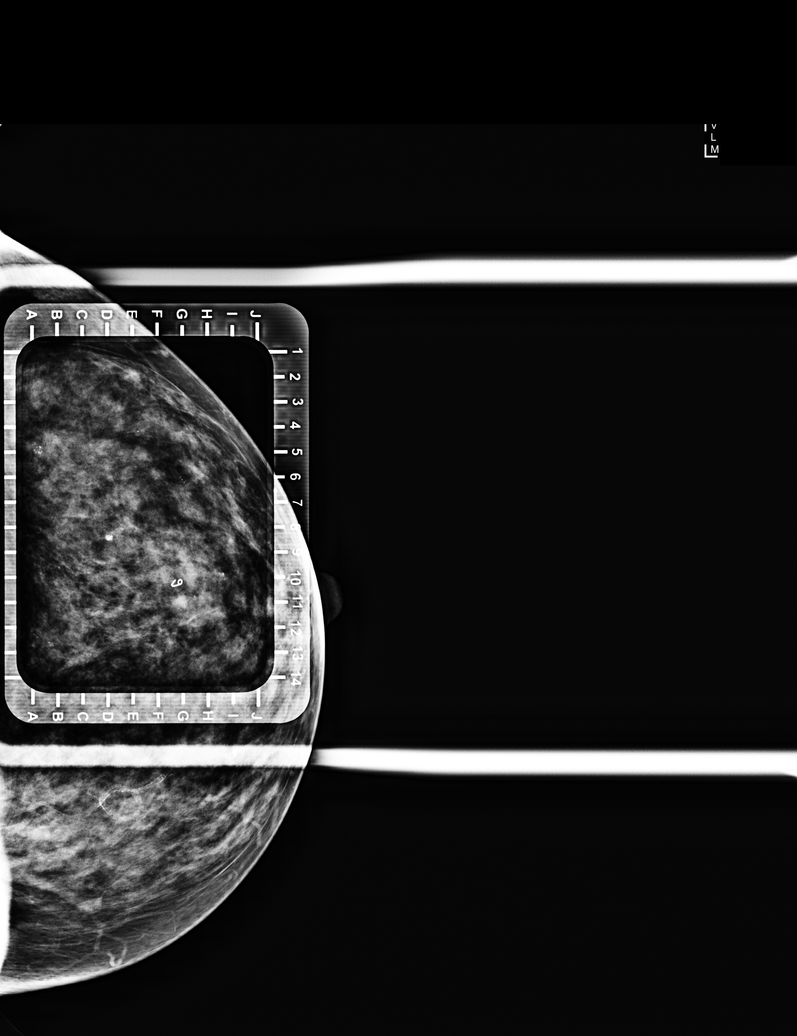

[6 of 6 positions shown; findings below may reference images not displayed]

FINDINGS: Patient presents for needle localization prior to left breast
excisional biopsy. I met with the patient and we discussed the
procedure of needle localization including benefits and
alternatives. We discussed the high likelihood of a successful
procedure. We discussed the risks of the procedure, including
infection, bleeding, tissue injury, and further surgery. Informed,
written consent was given. The usual time-out protocol was performed
immediately prior to the procedure.

Using mammographic guidance, sterile technique, 1% lidocaine and a 5
cm modified Kopans needle, the wing shaped biopsy marking clip in
the left breast was localized using a superior approach. The images
were marked for Dr. Dairo De Jesus.
IMPRESSION: Needle localization left breast. No apparent complications.

## 2017-12-01 ENCOUNTER — Other Ambulatory Visit: Payer: Self-pay | Admitting: *Deleted

## 2017-12-01 MED ORDER — ANASTROZOLE 1 MG PO TABS: 1.0000 mg | ORAL_TABLET | Freq: Every day | ORAL | 1 refills | Status: DC

## 2018-02-12 ENCOUNTER — Telehealth: Payer: Self-pay | Admitting: Internal Medicine

## 2018-02-12 NOTE — Telephone Encounter (Signed)
Move from Thursday appt for Ammie

## 2018-02-15 NOTE — Telephone Encounter (Signed)
04/15/18 AWV canceled d/t program no longer available on Thursdays. Rescheduled to 04/07/18.

## 2018-02-23 ENCOUNTER — Other Ambulatory Visit: Payer: Self-pay | Admitting: Internal Medicine

## 2018-02-23 DIAGNOSIS — E782 Mixed hyperlipidemia: Secondary | ICD-10-CM

## 2018-02-23 DIAGNOSIS — I10 Essential (primary) hypertension: Secondary | ICD-10-CM

## 2018-02-23 DIAGNOSIS — K219 Gastro-esophageal reflux disease without esophagitis: Secondary | ICD-10-CM

## 2018-04-06 ENCOUNTER — Ambulatory Visit: Payer: Medicare Other | Admitting: Oncology

## 2018-04-07 ENCOUNTER — Ambulatory Visit (INDEPENDENT_AMBULATORY_CARE_PROVIDER_SITE_OTHER): Payer: Medicare Other

## 2018-04-07 VITALS — BP 152/60 | HR 72 | Temp 98.4°F | Resp 12 | Ht 68.0 in | Wt 140.4 lb

## 2018-04-07 DIAGNOSIS — Z1231 Encounter for screening mammogram for malignant neoplasm of breast: Secondary | ICD-10-CM

## 2018-04-07 DIAGNOSIS — Z0000 Encounter for general adult medical examination without abnormal findings: Secondary | ICD-10-CM

## 2018-04-07 DIAGNOSIS — Z598 Other problems related to housing and economic circumstances: Secondary | ICD-10-CM

## 2018-04-07 DIAGNOSIS — Z853 Personal history of malignant neoplasm of breast: Secondary | ICD-10-CM | POA: Diagnosis not present

## 2018-04-07 DIAGNOSIS — E119 Type 2 diabetes mellitus without complications: Secondary | ICD-10-CM

## 2018-04-07 DIAGNOSIS — Z599 Problem related to housing and economic circumstances, unspecified: Secondary | ICD-10-CM

## 2018-04-07 DIAGNOSIS — Z1239 Encounter for other screening for malignant neoplasm of breast: Secondary | ICD-10-CM

## 2018-04-07 NOTE — Patient Instructions (Signed)
Brandy Diaz , Thank you for taking time to come for your Medicare Wellness Visit. I appreciate your ongoing commitment to your health goals. Please review the following plan we discussed and let me know if I can assist you in the future.   Screening recommendations/referrals: Colorectal Screening: Up to date Mammogram: Ordered today. You will receive a call from our office regarding your appointment Bone Density: Up to date  Vision and Dental Exams: Recommended annual ophthalmology exams for early detection of glaucoma and other disorders of the eye Recommended annual dental exams for proper oral hygiene  Diabetic Exams: Recommended annual diabetic eye exams for early detection of retinopathy Recommended annual diabetic foot exams for early detection of peripheral neuropathy.  Diabetic Eye Exam: Please schedule an appointment with your ophthalmologist for completion Diabetic Foot Exam: Up to date  Vaccinations: Influenza vaccine: Up to date Pneumococcal vaccine: Up to date Tdap vaccine: Up to date Shingles vaccine: Please call your insurance company to determine your out of pocket expense for the Shingrix vaccine. You may also receive this vaccine at your local pharmacy or Health Dept.  Advanced directives: Advance directive discussed with you today. I have provided a copy for you to complete at home and have notarized. Once this is complete please bring a copy in to our office so we can scan it into your chart.  Conditions/risks identified: Recommend to drink at least 6-8 8oz glasses of water per day.  Next appointment: Please schedule your Annual Wellness Visit with your Nurse Health Advisor in one year.  Preventive Care 73 Years and Older, Female Preventive care refers to lifestyle choices and visits with your health care provider that can promote health and wellness. What does preventive care include?  A yearly physical exam. This is also called an annual well  check.  Dental exams once or twice a year.  Routine eye exams. Ask your health care provider how often you should have your eyes checked.  Personal lifestyle choices, including:  Daily care of your teeth and gums.  Regular physical activity.  Eating a healthy diet.  Avoiding tobacco and drug use.  Limiting alcohol use.  Practicing safe sex.  Taking low-dose aspirin every day.  Taking vitamin and mineral supplements as recommended by your health care provider. What happens during an annual well check? The services and screenings done by your health care provider during your annual well check will depend on your age, overall health, lifestyle risk factors, and family history of disease. Counseling  Your health care provider may ask you questions about your:  Alcohol use.  Tobacco use.  Drug use.  Emotional well-being.  Home and relationship well-being.  Sexual activity.  Eating habits.  History of falls.  Memory and ability to understand (cognition).  Work and work Statistician.  Reproductive health. Screening  You may have the following tests or measurements:  Height, weight, and BMI.  Blood pressure.  Lipid and cholesterol levels. These may be checked every 5 years, or more frequently if you are over 17 years old.  Skin check.  Lung cancer screening. You may have this screening every year starting at age 56 if you have a 30-pack-year history of smoking and currently smoke or have quit within the past 15 years.  Fecal occult blood test (FOBT) of the stool. You may have this test every year starting at age 23.  Flexible sigmoidoscopy or colonoscopy. You may have a sigmoidoscopy every 5 years or a colonoscopy every 10 years starting at  age 60.  Hepatitis C blood test.  Hepatitis B blood test.  Sexually transmitted disease (STD) testing.  Diabetes screening. This is done by checking your blood sugar (glucose) after you have not eaten for a while  (fasting). You may have this done every 1-3 years.  Bone density scan. This is done to screen for osteoporosis. You may have this done starting at age 18.  Mammogram. This may be done every 1-2 years. Talk to your health care provider about how often you should have regular mammograms. Talk with your health care provider about your test results, treatment options, and if necessary, the need for more tests. Vaccines  Your health care provider may recommend certain vaccines, such as:  Influenza vaccine. This is recommended every year.  Tetanus, diphtheria, and acellular pertussis (Tdap, Td) vaccine. You may need a Td booster every 10 years.  Zoster vaccine. You may need this after age 35.  Pneumococcal 13-valent conjugate (PCV13) vaccine. One dose is recommended after age 23.  Pneumococcal polysaccharide (PPSV23) vaccine. One dose is recommended after age 59. Talk to your health care provider about which screenings and vaccines you need and how often you need them. This information is not intended to replace advice given to you by your health care provider. Make sure you discuss any questions you have with your health care provider. Document Released: 11/23/2015 Document Revised: 07/16/2016 Document Reviewed: 08/28/2015 Elsevier Interactive Patient Education  2017 [employer] Prevention in the Home Falls can cause injuries. They can happen to people of all ages. There are many things you can do to make your home safe and to help prevent falls. What can I do on the outside of my home?  Regularly fix the edges of walkways and driveways and fix any cracks.  Remove anything that might make you trip as you walk through a door, such as a raised step or threshold.  Trim any bushes or trees on the path to your home.  Use bright outdoor lighting.  Clear any walking paths of anything that might make someone trip, such as rocks or tools.  Regularly check to see if handrails are loose  or broken. Make sure that both sides of any steps have handrails.  Any raised decks and porches should have guardrails on the edges.  Have any leaves, snow, or ice cleared regularly.  Use sand or salt on walking paths during winter.  Clean up any spills in your garage right away. This includes oil or grease spills. What can I do in the bathroom?  Use night lights.  Install grab bars by the toilet and in the tub and shower. Do not use towel bars as grab bars.  Use non-skid mats or decals in the tub or shower.  If you need to sit down in the shower, use a plastic, non-slip stool.  Keep the floor dry. Clean up any water that spills on the floor as soon as it happens.  Remove soap buildup in the tub or shower regularly.  Attach bath mats securely with double-sided non-slip rug tape.  Do not have throw rugs and other things on the floor that can make you trip. What can I do in the bedroom?  Use night lights.  Make sure that you have a light by your bed that is easy to reach.  Do not use any sheets or blankets that are too big for your bed. They should not hang down onto the floor.  Have a firm chair  that has side arms. You can use this for support while you get dressed.  Do not have throw rugs and other things on the floor that can make you trip. What can I do in the kitchen?  Clean up any spills right away.  Avoid walking on wet floors.  Keep items that you use a lot in easy-to-reach places.  If you need to reach something above you, use a strong step stool that has a grab bar.  Keep electrical cords out of the way.  Do not use floor polish or wax that makes floors slippery. If you must use wax, use non-skid floor wax.  Do not have throw rugs and other things on the floor that can make you trip. What can I do with my stairs?  Do not leave any items on the stairs.  Make sure that there are handrails on both sides of the stairs and use them. Fix handrails that are  broken or loose. Make sure that handrails are as long as the stairways.  Check any carpeting to make sure that it is firmly attached to the stairs. Fix any carpet that is loose or worn.  Avoid having throw rugs at the top or bottom of the stairs. If you do have throw rugs, attach them to the floor with carpet tape.  Make sure that you have a light switch at the top of the stairs and the bottom of the stairs. If you do not have them, ask someone to add them for you. What else can I do to help prevent falls?  Wear shoes that:  Do not have high heels.  Have rubber bottoms.  Are comfortable and fit you well.  Are closed at the toe. Do not wear sandals.  If you use a stepladder:  Make sure that it is fully opened. Do not climb a closed stepladder.  Make sure that both sides of the stepladder are locked into place.  Ask someone to hold it for you, if possible.  Clearly mark and make sure that you can see:  Any grab bars or handrails.  First and last steps.  Where the edge of each step is.  Use tools that help you move around (mobility aids) if they are needed. These include:  Canes.  Walkers.  Scooters.  Crutches.  Turn on the lights when you go into a dark area. Replace any light bulbs as soon as they burn out.  Set up your furniture so you have a clear path. Avoid moving your furniture around.  If any of your floors are uneven, fix them.  If there are any pets around you, be aware of where they are.  Review your medicines with your doctor. Some medicines can make you feel dizzy. This can increase your chance of falling. Ask your doctor what other things that you can do to help prevent falls. This information is not intended to replace advice given to you by your health care provider. Make sure you discuss any questions you have with your health care provider. Document Released: 08/23/2009 Document Revised: 04/03/2016 Document Reviewed: 12/01/2014 Elsevier  Interactive Patient Education  2017 [employer].

## 2018-04-07 NOTE — Progress Notes (Signed)
Subjective:   Brandy Diaz is a 72 y.o. female who presents for Medicare Annual (Subsequent) preventive examination.  Review of Systems:  N/A Cardiac Risk Factors include: advanced age (>58men, >77 women);diabetes mellitus;dyslipidemia;hypertension;sedentary lifestyle     Objective:     Vitals: BP (!) 152/60 (BP Location: Left Arm, Patient Position: Sitting, Cuff Size: Normal)   Pulse 72   Temp 98.4 F (36.9 C) (Oral)   Resp 12   Ht 5\' 8"  (1.727 m)   Wt 140 lb 6.4 oz (63.7 kg)   SpO2 98%   BMI 21.35 kg/m   Body mass index is 21.35 kg/m.  Advanced Directives 04/07/2018 10/06/2017 06/05/2017 04/24/2017 04/03/2017 06/12/2016 12/31/2015  Does Patient Have a Medical Advance Directive? No No No No No No No  Would patient like information on creating a medical advance directive? Yes (MAU/Ambulatory/Procedural Areas - Information given) No - Patient declined No - Patient declined - No - Patient declined No - patient declined information No - patient declined information    Tobacco Social History   Tobacco Use  Smoking Status Never Smoker  Smokeless Tobacco Never Used  Tobacco Comment   smoking cessation materials not required     Counseling given: No Comment: smoking cessation materials not required  Clinical Intake:  Pre-visit preparation completed: Yes  Pain : No/denies pain   BMI - recorded: 21.59 Nutritional Status: BMI <19  Underweight Nutritional Risks: None  Nutrition Risk Assessment: Has the patient had any N/V/D within the last 2 months?  No Does the patient have any non-healing wounds?  No Has the patient had any unintentional weight loss or weight gain?  No  Is the patient diabetic?  Yes If diabetic, was a CBG obtained today?  No Did the patient bring in their glucometer from home?  No Comments: Pt monitors CBG's daily. Denies any financial strains with the device or supplies.  Diabetic Exams: Diabetic Eye Exam: Completed 04/01/16. Referral placed for  pt to be seen by Dr. Edison Pace for completion. Message sent to referral coordinator for scheduling purposes. Pt aware that she will receive a call from our office re: her appt. Diabetic Foot Exam: Completed 09/23/17  Interpreter Needed?: No  Information entered by :: AEversole, LPN  Past Medical History:  Diagnosis Date  . Anemia   . Breast cancer ([place])   . Diabetes mellitus without complication (HCC)    diet controlled  . Edema    ankles  . GERD (gastroesophageal reflux disease)   . Hypertension    Past Surgical History:  Procedure Laterality Date  . ABDOMINAL HYSTERECTOMY  1980  . BREAST LUMPECTOMY WITH NEEDLE LOCALIZATION Left 04/07/2017   Procedure: BREAST LUMPECTOMY WITH NEEDLE LOCALIZATION;  Surgeon: Vickie Epley, MD;  Location: [facility] ORS;  Service: General;  Laterality: Left;  . BREAST SURGERY Left    Breast Biopsy  . CATARACT EXTRACTION W/PHACO Right 06/12/2016   Procedure: CATARACT EXTRACTION PHACO AND INTRAOCULAR LENS PLACEMENT (IOC);  Surgeon: Eulogio Bear, MD;  Location: [facility] ORS;  Service: Ophthalmology;  Laterality: Right;  [place] 6.1 AP% 12.7 CDE .77 FLUID PACK LOT # [id number]  . COLONOSCOPY    . EYE SURGERY Right    Cataract Extraction with IOL   Family History  Problem Relation Age of Onset  . Breast cancer Mother 64  . Diabetes Mother   . Hypertension Mother   . CAD Father   . Breast cancer Sister   . Hypertension Sister   . Breast cancer Maternal Aunt  24  . Throat cancer Brother    Social History   Socioeconomic History  . Marital status: Married    Spouse name: Not on file  . Number of children: 0  . Years of education: Not on file  . Highest education level: 10th grade  Occupational History  . Occupation: Retired  Scientific laboratory technician  . Financial resource strain: Very hard  . Food insecurity:    Worry: Sometimes true    Inability: Sometimes true  . Transportation needs:    Medical: No    Non-medical: No  Tobacco Use  . Smoking status: Never  Smoker  . Smokeless tobacco: Never Used  . Tobacco comment: smoking cessation materials not required  Substance and Sexual Activity  . Alcohol use: No    Alcohol/week: 0.0 oz  . Drug use: No  . Sexual activity: Not Currently  Lifestyle  . Physical activity:    Days per week: 0 days    Minutes per session: 0 min  . Stress: Not at all  Relationships  . Social connections:    Talks on phone: Patient refused    Gets together: Patient refused    Attends religious service: Patient refused    Active member of club or organization: Patient refused    Attends meetings of clubs or organizations: Patient refused    Relationship status: Married  Other Topics Concern  . Not on file  Social History Narrative  . Not on file    Outpatient Encounter Medications as of 04/07/2018  Medication Sig  . ACCU-CHEK SOFTCLIX LANCETS lancets 1 each by Other route 2 (two) times daily. Use as instructed  . anastrozole (ARIMIDEX) 1 MG tablet Take 1 tablet (1 mg total) by mouth daily.  [name] Kitchen atorvastatin (LIPITOR) 10 MG tablet TAKE 1 TABLET BY MOUTH ONCE DAILY  . fluticasone (FLONASE) 50 MCG/ACT nasal spray Place 2 sprays into both nostrils daily.  [name] Kitchen glucose blood (ACCU-CHEK AVIVA PLUS) test strip Use to test blood sugar daily  . metoprolol succinate (TOPROL-XL) 25 MG 24 hr tablet TAKE 1 TABLET BY MOUTH  TWICE A DAY  . Multiple Vitamins-Calcium (ONE-A-DAY WOMENS PO) Take 1 tablet by mouth every other day.  [name] Kitchen omeprazole (PRILOSEC) 20 MG capsule TAKE 1 CAPSULE BY MOUTH  ONCE DAILY   No facility-administered encounter medications on file as of 04/07/2018.     Activities of Daily Living In your present state of health, do you have any difficulty performing the following activities: 04/07/2018  Hearing? N  Comment denies hearing aids  Vision? N  Comment wears eyeglasses  Difficulty concentrating or making decisions? N  Walking or climbing stairs? N  Dressing or bathing? N  Doing errands, shopping? N    Preparing Food and eating ? N  Comment partial upper dentures and full lower dentures  Using the Toilet? N  In the past six months, have you accidently leaked urine? N  Do you have problems with loss of bowel control? N  Managing your Medications? N  Managing your Finances? N  Housekeeping or managing your Housekeeping? N  Some recent data might be hidden    Patient Care Team: Glean Hess, MD as PCP - General (Internal Medicine) Sindy Guadeloupe, MD as Consulting Physician (Oncology)    Assessment:   This is a routine wellness examination for Cherril.  Exercise Activities and Dietary recommendations Current Exercise Habits: The patient does not participate in regular exercise at present, Exercise limited by: None identified  Goals    .  DIET - INCREASE WATER INTAKE     Recommend to drink at least 6-8 8oz glasses of water per day.       Fall Risk Fall Risk  04/07/2018 01/19/2017 12/31/2015 07/25/2015  Falls in the past year? No No No No  Risk for fall due to : Impaired vision - - -  Risk for fall due to: Comment wears eyeglasses - - -   FALL RISK PREVENTION PERTAINING TO HOME: Is your home free of loose throw rugs in walkways, pet beds, electrical cords, etc? Yes Is there adequate lighting in your home to reduce risk of falls?  Yes Are there stairs in or around your home WITH handrails? Yes  ASSISTIVE DEVICES UTILIZED TO PREVENT FALLS: Use of a cane, walker or w/c? No Grab bars in the bathroom? Yes  Shower chair or a place to sit while bathing? No An elevated toilet seat or a handicapped toilet? No  Timed Get Up and Go Performed: Yes. Pt ambulated 10 feet within 12 sec. Gait slow, steady and without the use of an assistive device. No intervention required at this time. Fall risk prevention has been discussed.  Community Resource Referral:  Pt declined my offer to send [employer] Referral to Care Guide for shower chair or an elevated toilet seat.  Depression  Screen PHQ 2/9 Scores 01/19/2017 12/31/2015 07/25/2015  PHQ - 2 Score 2 0 2  PHQ- 9 Score 7 - 5     Cognitive Function     6CIT Screen 04/07/2018 01/19/2017  What Year? 0 points 0 points  What month? 0 points 0 points  What time? 0 points 0 points  Count back from 20 0 points 0 points  Months in reverse 0 points 2 points  Repeat phrase 4 points 0 points  Total Score 4 2    Immunization History  Administered Date(s) Administered  . Influenza Split 08/24/2017  . Influenza, High Dose Seasonal PF 08/18/2017  . Influenza-Unspecified 09/26/2015, 08/24/2016, 08/24/2017  . Pneumococcal Conjugate-13 12/27/2014  . Pneumococcal Polysaccharide-23 11/11/2012, 05/05/2013, 05/05/2013  . Tdap 01/20/2013, 05/11/2013    Qualifies for Shingles Vaccine? Yes. Due for Shingrix. Education has been provided regarding the importance of this vaccine. Pt has been advised to call her insurance company to determine her out of pocket expense. Advised she may also receive this vaccine at her local pharmacy or Health Dept. Verbalized acceptance and understanding.  Screening Tests Health Maintenance  Topic Date Due  . OPHTHALMOLOGY EXAM  04/01/2017  . URINE MICROALBUMIN  01/19/2018  . MAMMOGRAM  03/05/2018  . HEMOGLOBIN A1C  03/23/2018  . Hepatitis C Screening  12/30/2020 (Originally [birth year])  . INFLUENZA VACCINE  06/10/2018  . FOOT EXAM  09/23/2018  . COLONOSCOPY  08/02/2022  . TETANUS/TDAP  05/12/2023  . DEXA SCAN  Completed  . PNA vac Low Risk Adult  Completed    Cancer Screenings: Lung: Low Dose CT Chest recommended if Age 4-80 years, 30 pack-year currently smoking OR have quit w/in 15years. Patient does not qualify. Breast:  Up to date on Mammogram? No. Completed 03/05/17. Repeat every year. Ordered today. Message sent to referral coordinator for scheduling purposes. Pt aware that she will receive a call from our office re: her appt.   Up to date of Bone Density/Dexa? Yes. Completed 04/28/17.  Osteoporotic screening no longer required Colorectal: Completed 08/02/12. Repeat every 10 years  Additional Screenings: Hepatitis C Screening: Declined. Requested to complete this screening next week. States she is due to have other  blood work completed during her CPE with Dr. Army Melia next week.    Plan:  I have personally reviewed and addressed the Medicare Annual Wellness questionnaire and have noted the following in the patient's chart:  A. Medical and social history B. Use of alcohol, tobacco or illicit drugs  C. Current medications and supplements D. Functional ability and status E.  Nutritional status F.  Physical activity G. Advance directives H. List of other physicians I.  Hospitalizations, surgeries, and ER visits in previous 12 months J.  [place] such as hearing and vision if needed, cognitive and depression L. Referrals and appointments  In addition, I have reviewed and discussed with patient certain preventive protocols, quality metrics, and best practice recommendations. A written personalized care plan for preventive services as well as general preventive health recommendations were provided to patient.  Signed,  Aleatha Borer, LPN Nurse Health Advisor  MD Recommendations: Due for Shingrix. Education has been provided regarding the importance of this vaccine. Pt has been advised to call her insurance company to determine her out of pocket expense. Advised she may also receive this vaccine at her local pharmacy or Health Dept. Verbalized acceptance and understanding.  Diabetic Eye Exam: Completed 04/01/16. Referral placed for pt to be seen by Dr. Edison Pace for completion. Message sent to referral coordinator for scheduling purposes. Pt aware that she will receive a call from our office re: her appt.  Due for mammogram. Completed 03/05/17. Repeat every year. Ordered today. Message sent to referral coordinator for scheduling purposes. Pt aware that she will receive a  call from our office re: her appt.  Due for Hep C screening. Declined stating she will complete along with other blood work next week.

## 2018-04-12 ENCOUNTER — Other Ambulatory Visit: Payer: Self-pay | Admitting: Oncology

## 2018-04-12 ENCOUNTER — Ambulatory Visit: Payer: Self-pay | Admitting: Internal Medicine

## 2018-04-15 ENCOUNTER — Ambulatory Visit: Payer: Self-pay

## 2018-04-15 ENCOUNTER — Encounter: Payer: Self-pay | Admitting: Internal Medicine

## 2018-04-15 ENCOUNTER — Ambulatory Visit (INDEPENDENT_AMBULATORY_CARE_PROVIDER_SITE_OTHER): Payer: Medicare Other | Admitting: Internal Medicine

## 2018-04-15 ENCOUNTER — Other Ambulatory Visit: Payer: Self-pay | Admitting: Internal Medicine

## 2018-04-15 VITALS — BP 140/80 | HR 85 | Temp 98.3°F | Resp 16 | Ht 66.0 in | Wt 141.0 lb

## 2018-04-15 DIAGNOSIS — E785 Hyperlipidemia, unspecified: Secondary | ICD-10-CM

## 2018-04-15 DIAGNOSIS — I10 Essential (primary) hypertension: Secondary | ICD-10-CM

## 2018-04-15 DIAGNOSIS — N183 Chronic kidney disease, stage 3 unspecified: Secondary | ICD-10-CM

## 2018-04-15 DIAGNOSIS — Z0000 Encounter for general adult medical examination without abnormal findings: Secondary | ICD-10-CM | POA: Diagnosis not present

## 2018-04-15 DIAGNOSIS — E1169 Type 2 diabetes mellitus with other specified complication: Secondary | ICD-10-CM | POA: Diagnosis not present

## 2018-04-15 DIAGNOSIS — D0512 Intraductal carcinoma in situ of left breast: Secondary | ICD-10-CM

## 2018-04-15 DIAGNOSIS — K219 Gastro-esophageal reflux disease without esophagitis: Secondary | ICD-10-CM

## 2018-04-15 DIAGNOSIS — E1122 Type 2 diabetes mellitus with diabetic chronic kidney disease: Secondary | ICD-10-CM

## 2018-04-15 LAB — POCT URINALYSIS DIPSTICK
BILIRUBIN UA: NEGATIVE
Blood, UA: NEGATIVE
GLUCOSE UA: NEGATIVE
KETONES UA: NEGATIVE
NITRITE UA: NEGATIVE
PROTEIN UA: NEGATIVE
SPEC GRAV UA: 1.01 (ref 1.010–1.025)
Urobilinogen, UA: 0.2 E.U./dL
pH, UA: 6 (ref 5.0–8.0)

## 2018-04-15 NOTE — Progress Notes (Signed)
Date:  04/15/2018   Name:  Brandy Diaz   DOB:  1946-08-27   MRN:  [id number]   Chief Complaint: Annual Exam Brandy Diaz is a 72 y.o. female who presents today for her Complete Annual Exam. She feels well. She reports exercising gardening. She reports she is sleeping well. Mammogram is due - she will schedule.  Hypertension  This is a chronic problem. The problem is controlled. Pertinent negatives include no chest pain, headaches, palpitations or shortness of breath. Past treatments include beta blockers. The current treatment provides significant improvement. There are no compliance problems.   Diabetes  She presents for her follow-up diabetic visit. She has type 2 diabetes mellitus. Her disease course has been stable. Pertinent negatives for hypoglycemia include no dizziness, headaches, nervousness/anxiousness or tremors. Pertinent negatives for diabetes include no chest pain, no fatigue, no polydipsia and no polyuria. There are no hypoglycemic complications. She monitors blood glucose at home 3-4 x per week. There is no change in her home blood glucose trend. Her breakfast blood glucose is taken between 7-8 am. Her breakfast blood glucose range is generally 110-130 mg/dl. An ACE inhibitor/angiotensin II receptor blocker is not being taken. Eye exam is not current.  Gastroesophageal Reflux  She complains of heartburn. She reports no abdominal pain, no chest pain, no coughing or no wheezing. The problem occurs rarely. Pertinent negatives include no fatigue. She has tried a PPI for the symptoms.  Hyperlipidemia  This is a chronic problem. The problem is controlled. Associated symptoms include myalgias (in right arm since starting arimidex). Pertinent negatives include no chest pain or shortness of breath. There are no compliance problems.      Review of Systems  Constitutional: Negative for chills, fatigue and fever.  HENT: Negative for congestion, hearing loss, tinnitus, trouble  swallowing and voice change.   Eyes: Negative for visual disturbance.  Respiratory: Negative for cough, chest tightness, shortness of breath and wheezing.   Cardiovascular: Negative for chest pain, palpitations and leg swelling.  Gastrointestinal: Positive for heartburn. Negative for abdominal pain, constipation, diarrhea and vomiting.  Endocrine: Negative for polydipsia and polyuria.  Genitourinary: Negative for dysuria, frequency, genital sores, vaginal bleeding and vaginal discharge.  Musculoskeletal: Positive for myalgias (in right arm since starting arimidex). Negative for arthralgias, gait problem and joint swelling.  Skin: Negative for color change and rash.  Neurological: Negative for dizziness, tremors, light-headedness and headaches.  Hematological: Negative for adenopathy. Does not bruise/bleed easily.  Psychiatric/Behavioral: Negative for dysphoric mood and sleep disturbance. The patient is not nervous/anxious.     Patient Active Problem List   Diagnosis Date Noted  . Ductal carcinoma in situ (DCIS) of left breast 05/21/2017  . Localized edema 04/28/2017  . Essential (primary) hypertension 07/24/2015  . Gastro-esophageal reflux disease without esophagitis 07/24/2015  . Hyperlipidemia associated with type 2 diabetes mellitus ([place]) 07/24/2015  . Type 2 diabetes mellitus with stage 3 chronic kidney disease, without long-term current use of insulin ([place]) 07/24/2015  . Unexplained weight loss 07/24/2015  . Fibrocystic breast changes 07/24/2015    Prior to Admission medications   Medication Sig Start Date End Date Taking? Authorizing Provider  ACCU-CHEK SOFTCLIX LANCETS lancets 1 each by Other route 2 (two) times daily. Use as instructed 09/23/16  Yes Glean Hess, MD  anastrozole (ARIMIDEX) 1 MG tablet TAKE 1 TABLET BY MOUTH  DAILY 04/12/18  Yes Sindy Guadeloupe, MD  atorvastatin (LIPITOR) 10 MG tablet TAKE 1 TABLET BY MOUTH ONCE DAILY 02/23/18  Yes Glean Hess, MD    fluticasone [facility]) 50 MCG/ACT nasal spray Place 2 sprays into both nostrils daily. 01/19/17  Yes Glean Hess, MD  glucose blood (ACCU-CHEK AVIVA PLUS) test strip Use to test blood sugar daily 01/19/17  Yes Glean Hess, MD  metoprolol succinate (TOPROL-XL) 25 MG 24 hr tablet TAKE 1 TABLET BY MOUTH  TWICE A DAY 02/23/18  Yes Glean Hess, MD  Multiple Vitamins-Calcium (ONE-A-DAY WOMENS PO) Take 1 tablet by mouth every other day.   Yes [provider]  omeprazole (PRILOSEC) 20 MG capsule TAKE 1 CAPSULE BY MOUTH  ONCE DAILY 02/23/18  Yes Glean Hess, MD    Allergies  Allergen Reactions  . Ace Inhibitors Other (See Comments)    Cough  . Hydrochlorothiazide Other (See Comments)    Dizziness  . Triamterene-Hctz Other (See Comments)    Appetite loss    Past Surgical History:  Procedure Laterality Date  . ABDOMINAL HYSTERECTOMY  1980  . BREAST LUMPECTOMY WITH NEEDLE LOCALIZATION Left 04/07/2017   Procedure: BREAST LUMPECTOMY WITH NEEDLE LOCALIZATION;  Surgeon: Vickie Epley, MD;  Location: [facility] ORS;  Service: General;  Laterality: Left;  . BREAST SURGERY Left    Breast Biopsy  . CATARACT EXTRACTION W/PHACO Right 06/12/2016   Procedure: CATARACT EXTRACTION PHACO AND INTRAOCULAR LENS PLACEMENT (IOC);  Surgeon: Eulogio Bear, MD;  Location: [facility] ORS;  Service: Ophthalmology;  Laterality: Right;  [place] 6.1 AP% 12.7 CDE .77 FLUID PACK LOT # [id number]  . COLONOSCOPY    . EYE SURGERY Right    Cataract Extraction with IOL    Social History   Tobacco Use  . Smoking status: Never Smoker  . Smokeless tobacco: Never Used  . Tobacco comment: smoking cessation materials not required  Substance Use Topics  . Alcohol use: No    Alcohol/week: 0.0 oz  . Drug use: No     Medication list has been reviewed and updated.  Current Meds  Medication Sig  . ACCU-CHEK SOFTCLIX LANCETS lancets 1 each by Other route 2 (two) times daily. Use as instructed  . anastrozole  (ARIMIDEX) 1 MG tablet TAKE 1 TABLET BY MOUTH  DAILY  . atorvastatin (LIPITOR) 10 MG tablet TAKE 1 TABLET BY MOUTH ONCE DAILY  . fluticasone (FLONASE) 50 MCG/ACT nasal spray Place 2 sprays into both nostrils daily.  [name] Kitchen glucose blood (ACCU-CHEK AVIVA PLUS) test strip Use to test blood sugar daily  . metoprolol succinate (TOPROL-XL) 25 MG 24 hr tablet TAKE 1 TABLET BY MOUTH  TWICE A DAY  . Multiple Vitamins-Calcium (ONE-A-DAY WOMENS PO) Take 1 tablet by mouth every other day.  [name] Kitchen omeprazole (PRILOSEC) 20 MG capsule TAKE 1 CAPSULE BY MOUTH  ONCE DAILY    PHQ 2/9 Scores 04/15/2018 01/19/2017 12/31/2015 07/25/2015  PHQ - 2 Score 0 2 0 2  PHQ- 9 Score - 7 - 5    Physical Exam  Constitutional: She is oriented to person, place, and time. She appears well-developed and well-nourished. No distress.  HENT:  Head: Normocephalic and atraumatic.  Right Ear: Tympanic membrane and ear canal normal.  Left Ear: Tympanic membrane and ear canal normal.  Nose: Right sinus exhibits no maxillary sinus tenderness. Left sinus exhibits no maxillary sinus tenderness.  Mouth/Throat: Uvula is midline and oropharynx is clear and moist.  Eyes: Conjunctivae and EOM are normal. Right eye exhibits no discharge. Left eye exhibits no discharge. No scleral icterus.  Neck: Normal range of motion. Normal carotid pulses present.  Carotid bruit is not present. No erythema present. No thyromegaly present.  Cardiovascular: Normal rate, regular rhythm, normal heart sounds and normal pulses.  Pulmonary/Chest: Effort normal. No respiratory distress. She has no wheezes. Right breast exhibits no mass, no nipple discharge, no skin change and no tenderness. Left breast exhibits skin change. Left breast exhibits no mass, no nipple discharge and no tenderness.    Abdominal: Soft. Bowel sounds are normal. There is no hepatosplenomegaly. There is no tenderness. There is no CVA tenderness.  Musculoskeletal: Normal range of motion.  Lymphadenopathy:     She has no cervical adenopathy.    She has no axillary adenopathy.  Neurological: She is alert and oriented to person, place, and time. She has normal strength and normal reflexes. No cranial nerve deficit or sensory deficit.  Skin: Skin is warm, dry and intact. No rash noted.  Psychiatric: She has a normal mood and affect. Her speech is normal and behavior is normal. Thought content normal.  Nursing note and vitals reviewed.   BP 140/80   Pulse 85   Temp 98.3 F (36.8 C) (Oral)   Resp 16   Ht 5\' 6"  (1.676 m)   Wt 141 lb (64 kg)   SpO2 98%   BMI 22.76 kg/m   Assessment and Plan: 1. Annual physical exam Continue healthy diet and weight loss - POCT urinalysis dipstick  2. Hyperlipidemia associated with type 2 diabetes mellitus (HCC) Continue statin therapy - Lipid panel  3. Type 2 diabetes mellitus with stage 3 chronic kidney disease, without long-term current use of insulin (HCC) Continue current therapy - Hemoglobin A1c - Microalbumin / creatinine urine ratio  4. Gastro-esophageal reflux disease without esophagitis Stable on PPI - CBC with Differential/Platelet  5. Essential (primary) hypertension controlled - Comprehensive metabolic panel - TSH  6. Ductal carcinoma in situ (DCIS) of left breast Follow up with Oncology Continue Arimidex but discuss possible myalgia with Oncology   No orders of the defined types were placed in this encounter.   Partially dictated using Editor, commissioning. Any errors are unintentional.  Halina Maidens, MD [facility]  04/15/2018   There are no diagnoses linked to this encounter.

## 2018-04-16 ENCOUNTER — Encounter: Payer: Self-pay | Admitting: Oncology

## 2018-04-16 ENCOUNTER — Inpatient Hospital Stay: Payer: Medicare Other | Attending: Oncology | Admitting: Oncology

## 2018-04-16 ENCOUNTER — Telehealth: Payer: Self-pay

## 2018-04-16 VITALS — BP 166/83 | HR 77 | Temp 97.1°F | Resp 18 | Ht 66.0 in | Wt 142.5 lb

## 2018-04-16 DIAGNOSIS — Z170 Estrogen receptor positive status [ER+]: Secondary | ICD-10-CM | POA: Insufficient documentation

## 2018-04-16 DIAGNOSIS — D0512 Intraductal carcinoma in situ of left breast: Secondary | ICD-10-CM | POA: Diagnosis not present

## 2018-04-16 DIAGNOSIS — Z79811 Long term (current) use of aromatase inhibitors: Secondary | ICD-10-CM | POA: Insufficient documentation

## 2018-04-16 LAB — LIPID PANEL
Chol/HDL Ratio: 2.2 ratio (ref 0.0–4.4)
Cholesterol, Total: 170 mg/dL (ref 100–199)
HDL: 78 mg/dL (ref >39)
LDL Calculated: 82 mg/dL (ref 0–99)
TRIGLYCERIDES: 48 mg/dL (ref 0–149)
VLDL CHOLESTEROL CAL: 10 mg/dL (ref 5–40)

## 2018-04-16 LAB — CBC WITH DIFFERENTIAL/PLATELET
BASOS ABS: 0.1 10*3/uL (ref 0.0–0.2)
Basos: 2 %
EOS (ABSOLUTE): 0.2 10*3/uL (ref 0.0–0.4)
EOS: 4 %
HEMATOCRIT: 38.6 % (ref 34.0–46.6)
HEMOGLOBIN: 12.6 g/dL (ref 11.1–15.9)
Immature Grans (Abs): 0 10*3/uL (ref 0.0–0.1)
Immature Granulocytes: 0 %
LYMPHS ABS: 1.1 10*3/uL (ref 0.7–3.1)
LYMPHS: 26 %
MCH: 29.2 pg (ref 26.6–33.0)
MCHC: 32.6 g/dL (ref 31.5–35.7)
MCV: 90 fL (ref 79–97)
MONOCYTES: 12 %
Monocytes Absolute: 0.5 10*3/uL (ref 0.1–0.9)
Neutrophils Absolute: 2.4 10*3/uL (ref 1.4–7.0)
Neutrophils: 56 %
Platelets: 320 10*3/uL (ref 150–450)
RBC: 4.31 x10E6/uL (ref 3.77–5.28)
RDW: 14.2 % (ref 12.3–15.4)
WBC: 4.2 10*3/uL (ref 3.4–10.8)

## 2018-04-16 LAB — COMPREHENSIVE METABOLIC PANEL
A/G RATIO: 1.2 (ref 1.2–2.2)
ALT: 12 IU/L (ref 0–32)
AST: 18 IU/L (ref 0–40)
Albumin: 4.2 g/dL (ref 3.5–4.8)
Alkaline Phosphatase: 91 IU/L (ref 39–117)
BUN/Creatinine Ratio: 13 (ref 12–28)
BUN: 15 mg/dL (ref 8–27)
Bilirubin Total: 0.3 mg/dL (ref 0.0–1.2)
CO2: 23 mmol/L (ref 20–29)
CREATININE: 1.14 mg/dL — AB (ref 0.57–1.00)
Calcium: 9.8 mg/dL (ref 8.7–10.3)
Chloride: 103 mmol/L (ref 96–106)
GFR, EST AFRICAN AMERICAN: 55 mL/min/{1.73_m2} — AB (ref >59)
GFR, EST NON AFRICAN AMERICAN: 48 mL/min/{1.73_m2} — AB (ref >59)
GLOBULIN, TOTAL: 3.5 g/dL (ref 1.5–4.5)
Glucose: 88 mg/dL (ref 65–99)
Potassium: 4.6 mmol/L (ref 3.5–5.2)
Sodium: 142 mmol/L (ref 134–144)
TOTAL PROTEIN: 7.7 g/dL (ref 6.0–8.5)

## 2018-04-16 LAB — HEMOGLOBIN A1C
ESTIMATED AVERAGE GLUCOSE: 134 mg/dL
Hgb A1c MFr Bld: 6.3 % — ABNORMAL HIGH (ref 4.8–5.6)

## 2018-04-16 LAB — TSH: TSH: 1.26 u[IU]/mL (ref 0.450–4.500)

## 2018-04-16 NOTE — Telephone Encounter (Signed)
It can be in [name] - she just has to call the number on the card I gave her.

## 2018-04-16 NOTE — Telephone Encounter (Signed)
Lmom for patient

## 2018-04-16 NOTE — Telephone Encounter (Signed)
Patient would like an order for her mammogram so that way it can be preformed in [place].

## 2018-04-16 NOTE — Progress Notes (Signed)
Increase muscle cramps to right arm

## 2018-04-17 LAB — MICROALBUMIN / CREATININE URINE RATIO
Creatinine, Urine: 99.8 mg/dL
Microalb/Creat Ratio: 4.7 mg/g creat (ref 0.0–30.0)
Microalbumin, Urine: 4.7 ug/mL

## 2018-04-19 NOTE — Progress Notes (Addendum)
Hematology/Oncology Consult note [facility]  [phone] [phone]  Patient Care Team: Glean Hess, MD as PCP - General (Internal Medicine) Sindy Guadeloupe, MD as Consulting Physician (Oncology)   Name of the patient: Brandy Diaz  [id number]  1946-09-23   Date of visit: 04/19/18  Diagnosis- left breast ER+ DCIS  Chief complaint/ Reason for visit- routine f/u of left breast DCIS on arimidex  Heme/Onc history: Patient is a 72 year old female with a past medical history significant for hypertension and diet-controlled diabetes she recently underwent screening mammogram which showed an abnormal mass in the left breast 3 cm from the nipple 1 x 0.9 x 0.3 cm in size and mobile mass at 4:00 position 0.8 x 0.7 x 0.5 cm in size. Evaluation of axilla was negative for adenopathy. This was followed by ultrasound and the mass at the 5:30 position was stable long-term and consistent with benign lesion and core biopsy of the 4:00 position mass was recommended.  2. 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   3. 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 No necrosis was identified. Margins were negative for DCIS 8 mm from the deep margin. ER 5190% positive PR negative  4. Patient lives with her husband and is independent of her ADLs and IADLs. Menarche at 4 years. She does not have any children. She never breast-fed and was never on hormone replacement therapy. She has a remote history of hysterectomy probably secondary to fibroids. She has been having on and off leg swelling but it has been particularly worse over the last 1 week and her swelling has not subsided. Denies any shortness of breath  5.Recent bone density scan in June 2018 showed T score of -2.2 consistent with osteopenia. 10  year probability of a major osteoporotic fracture was 6% andhipfracture was 1.5%. Patient started on arimidex in June 2018   Interval history- patient is tolerating arimidex well with no major side effects. She is also taking calcium and Vit D. She does have waxing and waning ankle edema which is being monitored by Dr. Army Melia  ECOG PS- 1 Pain scale- 0   Review of systems- Review of Systems  Constitutional: Negative for chills, fever, malaise/fatigue and weight loss.  HENT: Negative for congestion, ear discharge and nosebleeds.   Eyes: Negative for blurred vision.  Respiratory: Negative for cough, hemoptysis, sputum production, shortness of breath and wheezing.   Cardiovascular: Positive for leg swelling. Negative for chest pain, palpitations, orthopnea and claudication.  Gastrointestinal: Negative for abdominal pain, blood in stool, constipation, diarrhea, heartburn, melena, nausea and vomiting.  Genitourinary: Negative for dysuria, flank pain, frequency, hematuria and urgency.  Musculoskeletal: Negative for back pain, joint pain and myalgias.  Skin: Negative for rash.  Neurological: Negative for dizziness, tingling, focal weakness, seizures, weakness and headaches.  Endo/Heme/Allergies: Does not bruise/bleed easily.  Psychiatric/Behavioral: Negative for depression and suicidal ideas. The patient does not have insomnia.       Allergies  Allergen Reactions  . Ace Inhibitors Other (See Comments)    Cough  . Hydrochlorothiazide Other (See Comments)    Dizziness  . Triamterene-Hctz Other (See Comments)    Appetite loss     Past Medical History:  Diagnosis Date  . Anemia   . Breast cancer ([place])   . Diabetes mellitus without complication ([place])  diet controlled  . Edema    ankles  . GERD (gastroesophageal reflux disease)   . Hypertension      Past Surgical History:  Procedure Laterality Date  . ABDOMINAL HYSTERECTOMY  1980  . BREAST LUMPECTOMY WITH NEEDLE  LOCALIZATION Left 04/07/2017   Procedure: BREAST LUMPECTOMY WITH NEEDLE LOCALIZATION;  Surgeon: Vickie Epley, MD;  Location: [facility] ORS;  Service: General;  Laterality: Left;  . BREAST SURGERY Left    Breast Biopsy  . CATARACT EXTRACTION W/PHACO Right 06/12/2016   Procedure: CATARACT EXTRACTION PHACO AND INTRAOCULAR LENS PLACEMENT (IOC);  Surgeon: Eulogio Bear, MD;  Location: [facility] ORS;  Service: Ophthalmology;  Laterality: Right;  [place] 6.1 AP% 12.7 CDE .77 FLUID PACK LOT # [id number]  . COLONOSCOPY    . EYE SURGERY Right    Cataract Extraction with IOL    Social History   Socioeconomic History  . Marital status: Married    Spouse name: Not on file  . Number of children: 0  . Years of education: Not on file  . Highest education level: 10th grade  Occupational History  . Occupation: Retired  Scientific laboratory technician  . Financial resource strain: Very hard  . Food insecurity:    Worry: Sometimes true    Inability: Sometimes true  . Transportation needs:    Medical: No    Non-medical: No  Tobacco Use  . Smoking status: Never Smoker  . Smokeless tobacco: Never Used  . Tobacco comment: smoking cessation materials not required  Substance and Sexual Activity  . Alcohol use: No    Alcohol/week: 0.0 oz  . Drug use: No  . Sexual activity: Not Currently  Lifestyle  . Physical activity:    Days per week: 0 days    Minutes per session: 0 min  . Stress: Not at all  Relationships  . Social connections:    Talks on phone: Patient refused    Gets together: Patient refused    Attends religious service: Patient refused    Active member of club or organization: Patient refused    Attends meetings of clubs or organizations: Patient refused    Relationship status: Married  . Intimate partner violence:    Fear of current or ex partner: No    Emotionally abused: No    Physically abused: No    Forced sexual activity: No  Other Topics Concern  . Not on file  Social History Narrative  . Not on  file    Family History  Problem Relation Age of Onset  . Breast cancer Mother 86  . Diabetes Mother   . Hypertension Mother   . CAD Father   . Breast cancer Sister   . Hypertension Sister   . Breast cancer Maternal Aunt 86  . Throat cancer Brother      Current Outpatient Medications:  .  anastrozole (ARIMIDEX) 1 MG tablet, TAKE 1 TABLET BY MOUTH  DAILY, Disp: 90 tablet, Rfl: 1 .  atorvastatin (LIPITOR) 10 MG tablet, TAKE 1 TABLET BY MOUTH ONCE DAILY, Disp: 90 tablet, Rfl: 1 .  fluticasone (FLONASE) 50 MCG/ACT nasal spray, Place 2 sprays into both nostrils daily., Disp: 48 g, Rfl: 3 .  metoprolol succinate (TOPROL-XL) 25 MG 24 hr tablet, TAKE 1 TABLET BY MOUTH  TWICE A DAY, Disp: 180 tablet, Rfl: 1 .  omeprazole (PRILOSEC) 20 MG capsule, TAKE 1 CAPSULE BY MOUTH  ONCE DAILY, Disp: 90 capsule, Rfl: 1 .  ACCU-CHEK SOFTCLIX LANCETS lancets, 1 each by Other route  2 (two) times daily. Use as instructed (Patient not taking: Reported on 04/16/2018), Disp: 100 each, Rfl: 12 .  glucose blood (ACCU-CHEK AVIVA PLUS) test strip, Use to test blood sugar daily (Patient not taking: Reported on 04/16/2018), Disp: 100 each, Rfl: 12 .  Multiple Vitamins-Calcium (ONE-A-DAY WOMENS PO), Take 1 tablet by mouth every other day., Disp: , Rfl:   Physical exam:  Vitals:   04/16/18 1516  BP: (!) 166/83  Pulse: 77  Resp: 18  Temp: (!) 97.1 F (36.2 C)  TempSrc: Tympanic  SpO2: 96%  Weight: 142 lb 8 oz (64.6 kg)  Height: 5\' 6"  (1.676 m)   Physical Exam  Constitutional: She is oriented to person, place, and time. She appears well-developed and well-nourished.  HENT:  Head: Normocephalic and atraumatic.  Eyes: Pupils are equal, round, and reactive to light. EOM are normal.  Neck: Normal range of motion.  Cardiovascular: Normal rate, regular rhythm and normal heart sounds.  Pulmonary/Chest: Effort normal and breath sounds normal.  Abdominal: Soft. Bowel sounds are normal.  Musculoskeletal: She exhibits  edema (trace b/l ankle edema).  Neurological: She is alert and oriented to person, place, and time.  Skin: Skin is warm and dry.   Breast exam was performed in seated and lying down position. Patient is status post left lumpectomy with a well-healed surgical scar. No evidence of any palpable masses. No evidence of axillary adenopathy. No evidence of any palpable masses or lumps in the right breast. No evidence of right axillary adenopathy   CMP Latest Ref Rng & Units 04/15/2018  Glucose 65 - 99 mg/dL 88  BUN 8 - 27 mg/dL 15  Creatinine 0.57 - 1.00 mg/dL 1.14(H)  Sodium 134 - 144 mmol/L 142  Potassium 3.5 - 5.2 mmol/L 4.6  Chloride 96 - 106 mmol/L 103  CO2 20 - 29 mmol/L 23  Calcium 8.7 - 10.3 mg/dL 9.8  Total Protein 6.0 - 8.5 g/dL 7.7  Total Bilirubin 0.0 - 1.2 mg/dL 0.3  Alkaline Phos 39 - 117 IU/L 91  AST 0 - 40 IU/L 18  ALT 0 - 32 IU/L 12   CBC Latest Ref Rng & Units 04/15/2018  WBC 3.4 - 10.8 x10E3/uL 4.2  Hemoglobin 11.1 - 15.9 g/dL 12.6  Hematocrit 34.0 - 46.6 % 38.6  Platelets 150 - 450 x10E3/uL 320     Assessment and plan- Patient is a 72 y.o. female with left breast ER+ DCIS on arimidex  Patient is tolerating arimidex, calciumand Vit D well and will take that for 5 years. Recent labs done by PCP were normal. She is overdue for a mammogram and we will get in touch with Dr. Rosana Hoes regarding this. I will see her back in 6 months. No labs    Visit Diagnosis 1. Ductal carcinoma in situ (DCIS) of left breast      Dr. Randa Evens, MD, MPH [facility] at [facility] [phone] 04/19/2018 8:53 AM

## 2018-04-20 ENCOUNTER — Telehealth: Payer: Self-pay | Admitting: *Deleted

## 2018-04-20 ENCOUNTER — Other Ambulatory Visit: Payer: Self-pay | Admitting: *Deleted

## 2018-04-20 DIAGNOSIS — D0512 Intraductal carcinoma in situ of left breast: Secondary | ICD-10-CM

## 2018-04-20 NOTE — Telephone Encounter (Signed)
Called pt and left message that we scheduled her mammogram for 6/21 at [facility] breast center to arrive at 2:20. I have asked her to call me back to make sure she got message and can go there. I will await her response

## 2018-04-21 NOTE — Telephone Encounter (Signed)
Pt called back and said she got my message and she will be able to make the appt for mammogram.

## 2018-04-30 ENCOUNTER — Ambulatory Visit
Admission: RE | Admit: 2018-04-30 | Discharge: 2018-04-30 | Disposition: A | Discharge status: Home / Self Care | Payer: Medicare Other | Source: Ambulatory Visit | Attending: Oncology | Admitting: Oncology

## 2018-04-30 DIAGNOSIS — R922 Inconclusive mammogram: Secondary | ICD-10-CM | POA: Diagnosis not present

## 2018-04-30 DIAGNOSIS — D0512 Intraductal carcinoma in situ of left breast: Secondary | ICD-10-CM

## 2018-07-01 ENCOUNTER — Other Ambulatory Visit: Payer: Self-pay | Admitting: Internal Medicine

## 2018-07-01 DIAGNOSIS — N183 Chronic kidney disease, stage 3 (moderate): Principal | ICD-10-CM

## 2018-07-01 DIAGNOSIS — E1122 Type 2 diabetes mellitus with diabetic chronic kidney disease: Secondary | ICD-10-CM

## 2018-08-16 ENCOUNTER — Encounter: Payer: Self-pay | Admitting: Internal Medicine

## 2018-08-16 ENCOUNTER — Ambulatory Visit (INDEPENDENT_AMBULATORY_CARE_PROVIDER_SITE_OTHER): Payer: Medicare Other | Admitting: Internal Medicine

## 2018-08-16 VITALS — BP 136/72 | HR 70 | Ht 66.0 in | Wt 143.0 lb

## 2018-08-16 DIAGNOSIS — E1122 Type 2 diabetes mellitus with diabetic chronic kidney disease: Secondary | ICD-10-CM

## 2018-08-16 DIAGNOSIS — N183 Chronic kidney disease, stage 3 unspecified: Secondary | ICD-10-CM

## 2018-08-16 DIAGNOSIS — I10 Essential (primary) hypertension: Secondary | ICD-10-CM | POA: Diagnosis not present

## 2018-08-16 DIAGNOSIS — K219 Gastro-esophageal reflux disease without esophagitis: Secondary | ICD-10-CM | POA: Diagnosis not present

## 2018-08-16 DIAGNOSIS — E782 Mixed hyperlipidemia: Secondary | ICD-10-CM

## 2018-08-16 DIAGNOSIS — Z23 Encounter for immunization: Secondary | ICD-10-CM

## 2018-08-16 DIAGNOSIS — Z1211 Encounter for screening for malignant neoplasm of colon: Secondary | ICD-10-CM

## 2018-08-16 MED ORDER — ATORVASTATIN CALCIUM 10 MG PO TABS: 10.0000 mg | ORAL_TABLET | Freq: Every day | ORAL | 1 refills | Status: DC

## 2018-08-16 MED ORDER — METOPROLOL SUCCINATE ER 25 MG PO TB24: 25.0000 mg | ORAL_TABLET | Freq: Two times a day (BID) | ORAL | 1 refills | Status: DC

## 2018-08-16 MED ORDER — OMEPRAZOLE 20 MG PO CPDR: 20.0000 mg | DELAYED_RELEASE_CAPSULE | Freq: Every day | ORAL | 1 refills | Status: DC

## 2018-08-16 NOTE — Progress Notes (Signed)
Date:  08/16/2018   Name:  Brandy Diaz   DOB:  Feb 13, 1946   MRN:  [id number]   Chief Complaint: Diabetes and Immunizations (High Dose Flu Shot.)  Hypertension  This is a chronic problem. The problem is controlled. Pertinent negatives include no chest pain, headaches, palpitations or shortness of breath. Past treatments include beta blockers. The current treatment provides significant improvement. There is no history of kidney disease, CAD/MI or CVA.  Diabetes  She presents for her follow-up diabetic visit. She has type 2 diabetes mellitus. Her disease course has been stable. Pertinent negatives for hypoglycemia include no headaches or tremors. Pertinent negatives for diabetes include no chest pain, no fatigue, no polydipsia and no polyuria. Pertinent negatives for diabetic complications include no CVA. Current diabetic treatment includes diet. She monitors blood glucose at home 3-4 x per week. Her breakfast blood glucose is taken between 8-9 am. Her breakfast blood glucose range is generally 110-130 mg/dl. An ACE inhibitor/angiotensin II receptor blocker is not being taken.  Gastroesophageal Reflux  She reports no abdominal pain, no chest pain or no coughing. The problem has been resolved. Pertinent negatives include no fatigue. She has tried a PPI for the symptoms.   Lab Results  Component Value Date   HGBA1C 6.3 (H) 04/15/2018    Review of Systems  Constitutional: Negative for appetite change, fatigue, fever and unexpected weight change.  HENT: Negative for tinnitus and trouble swallowing.   Eyes: Negative for visual disturbance.  Respiratory: Negative for cough, chest tightness and shortness of breath.   Cardiovascular: Negative for chest pain, palpitations and leg swelling.  Gastrointestinal: Negative for abdominal pain.  Endocrine: Negative for polydipsia and polyuria.  Genitourinary: Negative for dysuria and hematuria.  Musculoskeletal: Negative for arthralgias.    Neurological: Negative for tremors, numbness and headaches.  Psychiatric/Behavioral: Negative for dysphoric mood.    Patient Active Problem List   Diagnosis Date Noted  . Ductal carcinoma in situ (DCIS) of left breast 05/21/2017  . Localized edema 04/28/2017  . Essential (primary) hypertension 07/24/2015  . Gastro-esophageal reflux disease without esophagitis 07/24/2015  . Hyperlipidemia associated with type 2 diabetes mellitus ([place]) 07/24/2015  . Type 2 diabetes mellitus with stage 3 chronic kidney disease, without long-term current use of insulin ([place]) 07/24/2015  . Unexplained weight loss 07/24/2015  . Fibrocystic breast changes 07/24/2015    Allergies  Allergen Reactions  . Ace Inhibitors Other (See Comments)    Cough  . Hydrochlorothiazide Other (See Comments)    Dizziness  . Triamterene-Hctz Other (See Comments)    Appetite loss    Past Surgical History:  Procedure Laterality Date  . ABDOMINAL HYSTERECTOMY  1980  . 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 BREAST LUMPECTOMY WITH NEEDLE LOCALIZATION Left 04/07/2017   Procedure: BREAST LUMPECTOMY WITH NEEDLE LOCALIZATION;  Surgeon: Vickie Epley, MD;  Location: [facility] ORS;  Service: General;  Laterality: Left;  . BREAST SURGERY Left    Breast Biopsy  . CATARACT EXTRACTION W/PHACO Right 06/12/2016   Procedure: CATARACT EXTRACTION PHACO AND INTRAOCULAR LENS PLACEMENT (IOC);  Surgeon: Eulogio Bear, MD;  Location: [facility] ORS;  Service: Ophthalmology;  Laterality: Right;  [place] 6.1 AP% 12.7 CDE .77 FLUID PACK LOT # [id number]  . COLONOSCOPY    . EYE SURGERY Right    Cataract Extraction with IOL    Social History   Tobacco Use  . Smoking status: Never Smoker  .  Smokeless tobacco: Never Used  . Tobacco comment: smoking cessation materials not required  Substance Use Topics  . Alcohol use: No    Alcohol/week: 0.0 standard drinks  . Drug use: No      Medication list has been reviewed and updated.  Current Meds  Medication Sig  . ACCU-CHEK AVIVA PLUS test strip USE TO CHECK BLOOD SUGAR  DAILY  . ACCU-CHEK SOFTCLIX LANCETS lancets 1 each by Other route 2 (two) times daily. Use as instructed  . anastrozole (ARIMIDEX) 1 MG tablet TAKE 1 TABLET BY MOUTH  DAILY  . atorvastatin (LIPITOR) 10 MG tablet TAKE 1 TABLET BY MOUTH ONCE DAILY  . fluticasone (FLONASE) 50 MCG/ACT nasal spray Place 2 sprays into both nostrils daily.  . metoprolol succinate (TOPROL-XL) 25 MG 24 hr tablet TAKE 1 TABLET BY MOUTH  TWICE A DAY  . Multiple Vitamins-Calcium (ONE-A-DAY WOMENS PO) Take 1 tablet by mouth every other day.  [name] Kitchen omeprazole (PRILOSEC) 20 MG capsule TAKE 1 CAPSULE BY MOUTH  ONCE DAILY    PHQ 2/9 Scores 08/16/2018 04/15/2018 01/19/2017 12/31/2015  PHQ - 2 Score 0 0 2 0  PHQ- 9 Score - - 7 -    Physical Exam  Constitutional: She is oriented to person, place, and time. She appears well-developed. No distress.  HENT:  Head: Normocephalic and atraumatic.  Neck: Normal range of motion. Neck supple.  Cardiovascular: Normal rate, regular rhythm and normal heart sounds.  Pulmonary/Chest: Effort normal and breath sounds normal. No respiratory distress.  Abdominal: Soft. Bowel sounds are normal.  Musculoskeletal: Normal range of motion.  Lymphadenopathy:    She has no cervical adenopathy.  Neurological: She is alert and oriented to person, place, and time.  Skin: Skin is warm and dry. No rash noted.  Psychiatric: She has a normal mood and affect. Her behavior is normal. Thought content normal.  Nursing note and vitals reviewed.   BP 136/72 (BP Location: Right Arm, Patient Position: Sitting, Cuff Size: Normal)   Pulse 70   Ht 5\' 6"  (1.676 m)   Wt 143 lb (64.9 kg)   SpO2 99%   BMI 23.08 kg/m   Assessment and Plan: 1. Type 2 diabetes mellitus with stage 3 chronic kidney disease, without long-term current use of insulin (HCC) Doing well on diet -  Hemoglobin A1c - Comprehensive metabolic panel  2. Combined fat and carbohydrate induced hyperlipemia Continue statin - atorvastatin (LIPITOR) 10 MG tablet; Take 1 tablet (10 mg total) by mouth daily.  Dispense: 90 tablet; Refill: 1  3. Essential (primary) hypertension controlled - metoprolol succinate (TOPROL-XL) 25 MG 24 hr tablet; Take 1 tablet (25 mg total) by mouth 2 (two) times daily.  Dispense: 180 tablet; Refill: 1  4. Gastro-esophageal reflux disease without esophagitis Stable with no sx - omeprazole (PRILOSEC) 20 MG capsule; Take 1 capsule (20 mg total) by mouth daily.  Dispense: 90 capsule; Refill: 1  5. Need for influenza vaccination - Flu vaccine HIGH DOSE PF  6. Colon cancer screening - Fecal occult blood, imunochemical   Partially dictated using Editor, commissioning. Any errors are unintentional.  Halina Maidens, MD [facility]  08/16/2018

## 2018-08-17 LAB — COMPREHENSIVE METABOLIC PANEL
ALT: 12 IU/L (ref 0–32)
AST: 19 IU/L (ref 0–40)
Albumin/Globulin Ratio: 1.3 (ref 1.2–2.2)
Albumin: 4.1 g/dL (ref 3.5–4.8)
Alkaline Phosphatase: 87 IU/L (ref 39–117)
BUN/Creatinine Ratio: 10 — ABNORMAL LOW (ref 12–28)
BUN: 11 mg/dL (ref 8–27)
Bilirubin Total: 0.3 mg/dL (ref 0.0–1.2)
CO2: 23 mmol/L (ref 20–29)
Calcium: 9.5 mg/dL (ref 8.7–10.3)
Chloride: 101 mmol/L (ref 96–106)
Creatinine, Ser: 1.07 mg/dL — ABNORMAL HIGH (ref 0.57–1.00)
GFR calc Af Amer: 60 mL/min/{1.73_m2} (ref >59)
GFR calc non Af Amer: 52 mL/min/{1.73_m2} — ABNORMAL LOW (ref >59)
Globulin, Total: 3.1 g/dL (ref 1.5–4.5)
Glucose: 78 mg/dL (ref 65–99)
Potassium: 4.2 mmol/L (ref 3.5–5.2)
Sodium: 142 mmol/L (ref 134–144)
Total Protein: 7.2 g/dL (ref 6.0–8.5)

## 2018-08-17 LAB — HEMOGLOBIN A1C
Est. average glucose Bld gHb Est-mCnc: 131 mg/dL
Hgb A1c MFr Bld: 6.2 % — ABNORMAL HIGH (ref 4.8–5.6)

## 2018-08-18 DIAGNOSIS — Z1211 Encounter for screening for malignant neoplasm of colon: Secondary | ICD-10-CM | POA: Diagnosis not present

## 2018-08-22 LAB — FECAL OCCULT BLOOD, IMMUNOCHEMICAL: Fecal Occult Bld: NEGATIVE

## 2018-09-09 ENCOUNTER — Other Ambulatory Visit: Payer: Self-pay | Admitting: Oncology

## 2018-10-14 ENCOUNTER — Inpatient Hospital Stay: Payer: Medicare Other | Attending: Oncology | Admitting: Oncology

## 2018-10-14 ENCOUNTER — Other Ambulatory Visit: Payer: Self-pay

## 2018-10-14 VITALS — BP 173/91 | HR 80 | Temp 97.6°F | Resp 18 | Wt 142.3 lb

## 2018-10-14 DIAGNOSIS — Z79811 Long term (current) use of aromatase inhibitors: Secondary | ICD-10-CM | POA: Diagnosis not present

## 2018-10-14 DIAGNOSIS — Z853 Personal history of malignant neoplasm of breast: Secondary | ICD-10-CM

## 2018-10-14 DIAGNOSIS — Z8249 Family history of ischemic heart disease and other diseases of the circulatory system: Secondary | ICD-10-CM

## 2018-10-14 DIAGNOSIS — Z79899 Other long term (current) drug therapy: Secondary | ICD-10-CM | POA: Diagnosis not present

## 2018-10-14 DIAGNOSIS — Z08 Encounter for follow-up examination after completed treatment for malignant neoplasm: Secondary | ICD-10-CM

## 2018-10-14 DIAGNOSIS — I10 Essential (primary) hypertension: Secondary | ICD-10-CM | POA: Diagnosis not present

## 2018-10-14 DIAGNOSIS — Z170 Estrogen receptor positive status [ER+]: Secondary | ICD-10-CM | POA: Diagnosis not present

## 2018-10-14 DIAGNOSIS — D0512 Intraductal carcinoma in situ of left breast: Secondary | ICD-10-CM

## 2018-10-14 DIAGNOSIS — R609 Edema, unspecified: Secondary | ICD-10-CM

## 2018-10-14 DIAGNOSIS — Z923 Personal history of irradiation: Secondary | ICD-10-CM | POA: Diagnosis not present

## 2018-10-14 DIAGNOSIS — M8580 Other specified disorders of bone density and structure, unspecified site: Secondary | ICD-10-CM

## 2018-10-14 NOTE — Progress Notes (Signed)
Hematology/Oncology Consult note [facility]  [phone] [phone]  Patient Care Team: Glean Hess, MD as PCP - General (Internal Medicine) Sindy Guadeloupe, MD as Consulting Physician (Oncology)   Name of the patient: Brandy Diaz  [id number]  01/30/1946   Date of visit: 10/14/18  Diagnosis- left breast ER+ DCIS   Chief complaint/ Reason for visit-routine follow-up of DCIS  Heme/Onc history: Patient is a 72 year old female with a past medical history significant for hypertension and diet-controlled diabetes she recently underwent screening mammogram which showed an abnormal mass in the left breast 3 cm from the nipple 1 x 0.9 x 0.3 cm in size and mobile mass at 4:00 position 0.8 x 0.7 x 0.5 cm in size. Evaluation of axilla was negative for adenopathy. This was followed by ultrasound and the mass at the 5:30 position was stable long-term and consistent with benign lesion and core biopsy of the 4:00 position mass was recommended.  2. 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   3. 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 No necrosis was identified. Margins were negative for DCIS 8 mm from the deep margin. ER 5190% positive PR negative  4. Patient lives with her husband and is independent of her ADLs and IADLs. Menarche at 48 years. She does not have any children. She never breast-fed and was never on hormone replacement therapy. She has a remote history of hysterectomy probably secondary to fibroids. She has been having on and off leg swelling but it has been particularly worse over the last 1 week and her swelling has not subsided. Denies any shortness of breath  5.Recent bone density scan in June 2018 showed T score of -2.2 consistent with osteopenia. 10 year probability of  a major osteoporotic fracture was 6% andhipfracture was 1.5%. Patient started on arimidex in June 2018   Interval history-reports that her appetite is fair.  She has not had any unintentional weight loss.  Denies any new aches or pains anywhere.  She is tolerating Arimidex well without any significant side effects.  ECOG PS- 1 Pain scale- 0 Opioid associated constipation- no  Review of systems- Review of Systems  Constitutional: Negative for chills, fever, malaise/fatigue and weight loss.  HENT: Negative for congestion, ear discharge and nosebleeds.   Eyes: Negative for blurred vision.  Respiratory: Negative for cough, hemoptysis, sputum production, shortness of breath and wheezing.   Cardiovascular: Negative for chest pain, palpitations, orthopnea and claudication.  Gastrointestinal: Negative for abdominal pain, blood in stool, constipation, diarrhea, heartburn, melena, nausea and vomiting.  Genitourinary: Negative for dysuria, flank pain, frequency, hematuria and urgency.  Musculoskeletal: Negative for back pain, joint pain and myalgias.  Skin: Negative for rash.  Neurological: Negative for dizziness, tingling, focal weakness, seizures, weakness and headaches.  Endo/Heme/Allergies: Does not bruise/bleed easily.  Psychiatric/Behavioral: Negative for depression and suicidal ideas. The patient does not have insomnia.       Allergies  Allergen Reactions  . Ace Inhibitors Other (See Comments)    Cough  . Hydrochlorothiazide Other (See Comments)    Dizziness  . Triamterene-Hctz Other (See Comments)    Appetite loss     Past Medical History:  Diagnosis Date  . Anemia   . Breast cancer ([place])   . Diabetes mellitus without complication (HCC)    diet controlled  . Edema  ankles  . GERD (gastroesophageal reflux disease)   . Hypertension      Past Surgical History:  Procedure Laterality Date  . ABDOMINAL HYSTERECTOMY  1980  . BREAST LUMPECTOMY Left 03/2017   MIXED  DUCTAL AND LOBULAR CARCINOMA IN SITU INVOLVING A PAPILLOMA WITH   . BREAST LUMPECTOMY WITH NEEDLE LOCALIZATION Left 04/07/2017   Procedure: BREAST LUMPECTOMY WITH NEEDLE LOCALIZATION;  Surgeon: Vickie Epley, MD;  Location: [facility] ORS;  Service: General;  Laterality: Left;  . BREAST SURGERY Left    Breast Biopsy  . CATARACT EXTRACTION W/PHACO Right 06/12/2016   Procedure: CATARACT EXTRACTION PHACO AND INTRAOCULAR LENS PLACEMENT (IOC);  Surgeon: Eulogio Bear, MD;  Location: [facility] ORS;  Service: Ophthalmology;  Laterality: Right;  [place] 6.1 AP% 12.7 CDE .77 FLUID PACK LOT # [id number]  . COLONOSCOPY    . EYE SURGERY Right    Cataract Extraction with IOL    Social History   Socioeconomic History  . Marital status: Married    Spouse name: Not on file  . Number of children: 0  . Years of education: Not on file  . Highest education level: 10th grade  Occupational History  . Occupation: Retired  Scientific laboratory technician  . Financial resource strain: Very hard  . Food insecurity:    Worry: Sometimes true    Inability: Sometimes true  . Transportation needs:    Medical: No    Non-medical: No  Tobacco Use  . Smoking status: Never Smoker  . Smokeless tobacco: Never Used  . Tobacco comment: smoking cessation materials not required  Substance and Sexual Activity  . Alcohol use: No    Alcohol/week: 0.0 standard drinks  . Drug use: No  . Sexual activity: Not Currently  Lifestyle  . Physical activity:    Days per week: 0 days    Minutes per session: 0 min  . Stress: Not at all  Relationships  . Social connections:    Talks on phone: Patient refused    Gets together: Patient refused    Attends religious service: Patient refused    Active member of club or organization: Patient refused    Attends meetings of clubs or organizations: Patient refused    Relationship status: Married  . Intimate partner violence:    Fear of current or ex partner: No    Emotionally abused: No    Physically  abused: No    Forced sexual activity: No  Other Topics Concern  . Not on file  Social History Narrative  . Not on file    Family History  Problem Relation Age of Onset  . Breast cancer Mother 55  . Diabetes Mother   . Hypertension Mother   . CAD Father   . Breast cancer Sister   . Hypertension Sister   . Breast cancer Maternal Aunt 2  . Throat cancer Brother      Current Outpatient Medications:  .  anastrozole (ARIMIDEX) 1 MG tablet, TAKE 1 TABLET BY MOUTH  DAILY, Disp: 90 tablet, Rfl: 0 .  atorvastatin (LIPITOR) 10 MG tablet, Take 1 tablet (10 mg total) by mouth daily., Disp: 90 tablet, Rfl: 1 .  metoprolol succinate (TOPROL-XL) 25 MG 24 hr tablet, Take 1 tablet (25 mg total) by mouth 2 (two) times daily., Disp: 180 tablet, Rfl: 1 .  Multiple Vitamins-Calcium (ONE-A-DAY WOMENS PO), Take 1 tablet by mouth every other day., Disp: , Rfl:  .  omeprazole (PRILOSEC) 20 MG capsule, Take 1 capsule (20 mg total) by  mouth daily., Disp: 90 capsule, Rfl: 1 .  ACCU-CHEK AVIVA PLUS test strip, USE TO CHECK BLOOD SUGAR  DAILY (Patient not taking: Reported on 10/14/2018), Disp: 100 each, Rfl: 3 .  ACCU-CHEK SOFTCLIX LANCETS lancets, 1 each by Other route 2 (two) times daily. Use as instructed (Patient not taking: Reported on 10/14/2018), Disp: 100 each, Rfl: 12 .  fluticasone (FLONASE) 50 MCG/ACT nasal spray, Place 2 sprays into both nostrils daily. (Patient not taking: Reported on 10/14/2018), Disp: 48 g, Rfl: 3  Physical exam:  Vitals:   10/14/18 1424  BP: (!) 173/91  Pulse: 80  Resp: 18  Temp: 97.6 F (36.4 C)  TempSrc: Tympanic  Weight: 142 lb 4.8 oz (64.5 kg)   Physical Exam  Constitutional: She is oriented to person, place, and time.  Thin elderly female in no acute distress  HENT:  Head: Normocephalic and atraumatic.  Eyes: Pupils are equal, round, and reactive to light. EOM are normal.  Neck: Normal range of motion.  Cardiovascular: Normal rate, regular rhythm and normal heart  sounds.  Pulmonary/Chest: Effort normal and breath sounds normal.  Abdominal: Soft. Bowel sounds are normal.  Musculoskeletal: She exhibits edema (Bilateral ankle edema).  Neurological: She is alert and oriented to person, place, and time.  Skin: Skin is warm and dry.   Breast exam was performed in seated and lying down position. Patient is status post left lumpectomy with a well-healed surgical scar. No evidence of any palpable masses. No evidence of axillary adenopathy. No evidence of any palpable masses or lumps in the right breast. No evidence of right axillary adenopathy   CMP Latest Ref Rng & Units 08/16/2018  Glucose 65 - 99 mg/dL 78  BUN 8 - 27 mg/dL 11  Creatinine 0.57 - 1.00 mg/dL 1.07(H)  Sodium 134 - 144 mmol/L 142  Potassium 3.5 - 5.2 mmol/L 4.2  Chloride 96 - 106 mmol/L 101  CO2 20 - 29 mmol/L 23  Calcium 8.7 - 10.3 mg/dL 9.5  Total Protein 6.0 - 8.5 g/dL 7.2  Total Bilirubin 0.0 - 1.2 mg/dL 0.3  Alkaline Phos 39 - 117 IU/L 87  AST 0 - 40 IU/L 19  ALT 0 - 32 IU/L 12   CBC Latest Ref Rng & Units 04/15/2018  WBC 3.4 - 10.8 x10E3/uL 4.2  Hemoglobin 11.1 - 15.9 g/dL 12.6  Hematocrit 34.0 - 46.6 % 38.6  Platelets 150 - 450 x10E3/uL 320      Assessment and plan- Patient is a 72 y.o. female with left breast ER positive DCIS status post surgery and adjuvant radiation and currently on Arimidex  Patient will continue Arimidex for 4 more years.  Along with calcium and vitamin D.  I will see her back in 6 months no labs.  Mammograms to be coordinated by Dr. Shann Medal office.  I will order a bone density scan at her next visit   Visit Diagnosis 1. Encounter for follow-up surveillance of breast cancer      Dr. Randa Evens, MD, MPH [facility] [phone] 10/14/2018 2:45 PM

## 2018-10-14 NOTE — Progress Notes (Signed)
Here for follow up. Per pt overall" ive been doing fine" no voiced or noted c/o.

## 2018-12-02 ENCOUNTER — Other Ambulatory Visit: Payer: Self-pay | Admitting: Oncology

## 2018-12-31 ENCOUNTER — Telehealth: Payer: Self-pay

## 2018-12-31 NOTE — Telephone Encounter (Signed)
12/31/2018 Call went directly to vm. Left msg on vm with my name and number if resource letter was not received or any questions.MA

## 2019-03-03 ENCOUNTER — Other Ambulatory Visit: Payer: Self-pay | Admitting: Internal Medicine

## 2019-03-03 DIAGNOSIS — E782 Mixed hyperlipidemia: Secondary | ICD-10-CM

## 2019-03-03 DIAGNOSIS — I10 Essential (primary) hypertension: Secondary | ICD-10-CM

## 2019-03-03 DIAGNOSIS — K219 Gastro-esophageal reflux disease without esophagitis: Secondary | ICD-10-CM

## 2019-03-30 ENCOUNTER — Other Ambulatory Visit: Payer: Self-pay | Admitting: Oncology

## 2019-04-08 ENCOUNTER — Telehealth: Payer: Self-pay

## 2019-04-08 NOTE — Telephone Encounter (Signed)
Patient wants to reschedule her upcoming appt.  Please call to reschedule this.

## 2019-04-11 ENCOUNTER — Other Ambulatory Visit: Payer: Self-pay

## 2019-04-11 ENCOUNTER — Ambulatory Visit (INDEPENDENT_AMBULATORY_CARE_PROVIDER_SITE_OTHER): Payer: Medicare Other

## 2019-04-11 VITALS — BP 166/84 | HR 66 | Temp 98.3°F | Resp 16 | Ht 66.0 in | Wt 142.0 lb

## 2019-04-11 DIAGNOSIS — Z780 Asymptomatic menopausal state: Secondary | ICD-10-CM

## 2019-04-11 DIAGNOSIS — Z0000 Encounter for general adult medical examination without abnormal findings: Secondary | ICD-10-CM

## 2019-04-11 DIAGNOSIS — M810 Age-related osteoporosis without current pathological fracture: Secondary | ICD-10-CM | POA: Diagnosis not present

## 2019-04-11 DIAGNOSIS — Z1231 Encounter for screening mammogram for malignant neoplasm of breast: Secondary | ICD-10-CM

## 2019-04-11 NOTE — Patient Instructions (Signed)
Brandy Diaz , Thank you for taking time to come for your Medicare Wellness Visit. I appreciate your ongoing commitment to your health goals. Please review the following plan we discussed and let me know if I can assist you in the future.   Screening recommendations/referrals: Colonoscopy: done 08/02/12. Repeat in 2023. Mammogram: done 04/30/18. Please call [phone] to schedule your mammogram and bone density screening.  Bone Density: done 04/28/17. Recommended yearly ophthalmology/optometry visit for glaucoma screening and checkup Recommended yearly dental visit for hygiene and checkup  Vaccinations: Influenza vaccine: done 08/16/18 Pneumococcal vaccine: done 12/27/14 Tdap vaccine: done 05/11/13 Shingles vaccine: Shingrix discussed. Please contact your pharmacy for coverage information.    Advanced directives: Advance directive discussed with you today. Even though you declined this today please call our office should you change your mind and we can give you the proper paperwork for you to fill out.  Conditions/risks identified: Recommend drinking 6-8 glasses of water per day.  Next appointment: Please follow up in one year for your Medicare Annual Wellness visit.     Preventive Care 47 Years and Older, Female Preventive care refers to lifestyle choices and visits with your health care provider that can promote health and wellness. What does preventive care include?  A yearly physical exam. This is also called an annual well check.  Dental exams once or twice a year.  Routine eye exams. Ask your health care provider how often you should have your eyes checked.  Personal lifestyle choices, including:  Daily care of your teeth and gums.  Regular physical activity.  Eating a healthy diet.  Avoiding tobacco and drug use.  Limiting alcohol use.  Practicing safe sex.  Taking low-dose aspirin every day.  Taking vitamin and mineral supplements as recommended by your health  care provider. What happens during an annual well check? The services and screenings done by your health care provider during your annual well check will depend on your age, overall health, lifestyle risk factors, and family history of disease. Counseling  Your health care provider may ask you questions about your:  Alcohol use.  Tobacco use.  Drug use.  Emotional well-being.  Home and relationship well-being.  Sexual activity.  Eating habits.  History of falls.  Memory and ability to understand (cognition).  Work and work Statistician.  Reproductive health. Screening  You may have the following tests or measurements:  Height, weight, and BMI.  Blood pressure.  Lipid and cholesterol levels. These may be checked every 5 years, or more frequently if you are over 74 years old.  Skin check.  Lung cancer screening. You may have this screening every year starting at age 18 if you have a 30-pack-year history of smoking and currently smoke or have quit within the past 15 years.  Fecal occult blood test (FOBT) of the stool. You may have this test every year starting at age 33.  Flexible sigmoidoscopy or colonoscopy. You may have a sigmoidoscopy every 5 years or a colonoscopy every 10 years starting at age 21.  Hepatitis C blood test.  Hepatitis B blood test.  Sexually transmitted disease (STD) testing.  Diabetes screening. This is done by checking your blood sugar (glucose) after you have not eaten for a while (fasting). You may have this done every 1-3 years.  Bone density scan. This is done to screen for osteoporosis. You may have this done starting at age 66.  Mammogram. This may be done every 1-2 years. Talk to your health care provider about  how often you should have regular mammograms. Talk with your health care provider about your test results, treatment options, and if necessary, the need for more tests. Vaccines  Your health care provider may recommend certain  vaccines, such as:  Influenza vaccine. This is recommended every year.  Tetanus, diphtheria, and acellular pertussis (Tdap, Td) vaccine. You may need a Td booster every 10 years.  Zoster vaccine. You may need this after age 68.  Pneumococcal 13-valent conjugate (PCV13) vaccine. One dose is recommended after age 1.  Pneumococcal polysaccharide (PPSV23) vaccine. One dose is recommended after age 58. Talk to your health care provider about which screenings and vaccines you need and how often you need them. This information is not intended to replace advice given to you by your health care provider. Make sure you discuss any questions you have with your health care provider. Document Released: 11/23/2015 Document Revised: 07/16/2016 Document Reviewed: 08/28/2015 Elsevier Interactive Patient Education  2017 [place] Prevention in the Home Falls can cause injuries. They can happen to people of all ages. There are many things you can do to make your home safe and to help prevent falls. What can I do on the outside of my home?  Regularly fix the edges of walkways and driveways and fix any cracks.  Remove anything that might make you trip as you walk through a door, such as a raised step or threshold.  Trim any bushes or trees on the path to your home.  Use bright outdoor lighting.  Clear any walking paths of anything that might make someone trip, such as rocks or tools.  Regularly check to see if handrails are loose or broken. Make sure that both sides of any steps have handrails.  Any raised decks and porches should have guardrails on the edges.  Have any leaves, snow, or ice cleared regularly.  Use sand or salt on walking paths during winter.  Clean up any spills in your garage right away. This includes oil or grease spills. What can I do in the bathroom?  Use night lights.  Install grab bars by the toilet and in the tub and shower. Do not use towel bars as grab  bars.  Use non-skid mats or decals in the tub or shower.  If you need to sit down in the shower, use a plastic, non-slip stool.  Keep the floor dry. Clean up any water that spills on the floor as soon as it happens.  Remove soap buildup in the tub or shower regularly.  Attach bath mats securely with double-sided non-slip rug tape.  Do not have throw rugs and other things on the floor that can make you trip. What can I do in the bedroom?  Use night lights.  Make sure that you have a light by your bed that is easy to reach.  Do not use any sheets or blankets that are too big for your bed. They should not hang down onto the floor.  Have a firm chair that has side arms. You can use this for support while you get dressed.  Do not have throw rugs and other things on the floor that can make you trip. What can I do in the kitchen?  Clean up any spills right away.  Avoid walking on wet floors.  Keep items that you use a lot in easy-to-reach places.  If you need to reach something above you, use a strong step stool that has a grab bar.  Keep  electrical cords out of the way.  Do not use floor polish or wax that makes floors slippery. If you must use wax, use non-skid floor wax.  Do not have throw rugs and other things on the floor that can make you trip. What can I do with my stairs?  Do not leave any items on the stairs.  Make sure that there are handrails on both sides of the stairs and use them. Fix handrails that are broken or loose. Make sure that handrails are as long as the stairways.  Check any carpeting to make sure that it is firmly attached to the stairs. Fix any carpet that is loose or worn.  Avoid having throw rugs at the top or bottom of the stairs. If you do have throw rugs, attach them to the floor with carpet tape.  Make sure that you have a light switch at the top of the stairs and the bottom of the stairs. If you do not have them, ask someone to add them for  you. What else can I do to help prevent falls?  Wear shoes that:  Do not have high heels.  Have rubber bottoms.  Are comfortable and fit you well.  Are closed at the toe. Do not wear sandals.  If you use a stepladder:  Make sure that it is fully opened. Do not climb a closed stepladder.  Make sure that both sides of the stepladder are locked into place.  Ask someone to hold it for you, if possible.  Clearly mark and make sure that you can see:  Any grab bars or handrails.  First and last steps.  Where the edge of each step is.  Use tools that help you move around (mobility aids) if they are needed. These include:  Canes.  Walkers.  Scooters.  Crutches.  Turn on the lights when you go into a dark area. Replace any light bulbs as soon as they burn out.  Set up your furniture so you have a clear path. Avoid moving your furniture around.  If any of your floors are uneven, fix them.  If there are any pets around you, be aware of where they are.  Review your medicines with your doctor. Some medicines can make you feel dizzy. This can increase your chance of falling. Ask your doctor what other things that you can do to help prevent falls. This information is not intended to replace advice given to you by your health care provider. Make sure you discuss any questions you have with your health care provider. Document Released: 08/23/2009 Document Revised: 04/03/2016 Document Reviewed: 12/01/2014 Elsevier Interactive Patient Education  2017 [employer].

## 2019-04-11 NOTE — Progress Notes (Addendum)
Subjective:   Brandy Diaz is a 73 y.o. female who presents for Medicare Annual (Subsequent) preventive examination.  Review of Systems:   Cardiac Risk Factors include: advanced age (>71men, >22 women);diabetes mellitus;dyslipidemia;hypertension     Objective:     Vitals: BP (!) 166/84 (BP Location: Right Arm, Cuff Size: Normal)   Pulse 66   Temp 98.3 F (36.8 C) (Oral)   Resp 16   Ht 5\' 6"  (1.676 m)   Wt 142 lb (64.4 kg)   SpO2 99%   BMI 22.92 kg/m   Body mass index is 22.92 kg/m.  Advanced Directives 04/11/2019 10/14/2018 04/16/2018 04/07/2018 10/06/2017 06/05/2017 04/24/2017  Does Patient Have a Medical Advance Directive? No No No No No No No  Would patient like information on creating a medical advance directive? No - Patient declined No - Patient declined No - Patient declined Yes (MAU/Ambulatory/Procedural Areas - Information given) No - Patient declined No - Patient declined -    Tobacco Social History   Tobacco Use  Smoking Status Never Smoker  Smokeless Tobacco Never Used  Tobacco Comment   smoking cessation materials not required     Counseling given: Not Answered Comment: smoking cessation materials not required   Clinical Intake:  Pre-visit preparation completed: Yes  Pain : No/denies pain     BMI - recorded: 22.92 Nutritional Status: BMI of 19-24  Normal Nutritional Risks: None Diabetes: Yes CBG done?: No Did pt. bring in CBG monitor from home?: No   Nutrition Risk Assessment:  Has the patient had any N/V/D within the last 2 months?  No  Does the patient have any non-healing wounds?  No  Has the patient had any unintentional weight loss or weight gain?  No   Diabetes:  Is the patient diabetic?  Yes  If diabetic, was a CBG obtained today?  No  Did the patient bring in their glucometer from home?  No  How often do you monitor your CBG's? Daily fasting.   Financial Strains and Diabetes Management:  Are you having any financial strains  with the device, your supplies or your medication? No .  Does the patient want to be seen by Chronic Care Management for management of their diabetes?  No  Would the patient like to be referred to a Nutritionist or for Diabetic Management?  No   Diabetic Exams:  Diabetic Eye Exam: Completed 2017. Overdue for diabetic eye exam. Pt has been advised about the importance in completing this exam.   Diabetic Foot Exam: Completed 08/16/18.    How often do you need to have someone help you when you read instructions, pamphlets, or other written materials from your doctor or pharmacy?: 1 - Never  Interpreter Needed?: No  Information entered by :: Clemetine Marker LPN  Past Medical History:  Diagnosis Date  . Anemia   . Breast cancer ([place])   . Diabetes mellitus without complication (HCC)    diet controlled  . Edema    ankles  . GERD (gastroesophageal reflux disease)   . Hypertension    Past Surgical History:  Procedure Laterality Date  . ABDOMINAL HYSTERECTOMY  1980  . 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 BREAST LUMPECTOMY WITH NEEDLE LOCALIZATION Left 04/07/2017   Procedure: BREAST LUMPECTOMY WITH NEEDLE LOCALIZATION;  Surgeon: Vickie Epley, MD;  Location: [facility] ORS;  Service: General;  Laterality: Left;  . BREAST SURGERY Left  Breast Biopsy  . CATARACT EXTRACTION W/PHACO Right 06/12/2016   Procedure: CATARACT EXTRACTION PHACO AND INTRAOCULAR LENS PLACEMENT (IOC);  Surgeon: Eulogio Bear, MD;  Location: [facility] ORS;  Service: Ophthalmology;  Laterality: Right;  [place] 6.1 AP% 12.7 CDE .77 FLUID PACK LOT # [id number]  . COLONOSCOPY    . EYE SURGERY Right    Cataract Extraction with IOL   Family History  Problem Relation Age of Onset  . Breast cancer Mother 28  . Diabetes Mother   . Hypertension Mother   . CAD Father   . Breast cancer Sister   . Hypertension Sister   . Breast cancer Maternal Aunt 51  . Throat  cancer Brother    Social History   Socioeconomic History  . Marital status: Married    Spouse name: Not on file  . Number of children: 0  . Years of education: Not on file  . Highest education level: 10th grade  Occupational History  . Occupation: Retired  Scientific laboratory technician  . Financial resource strain: Not very hard  . Food insecurity:    Worry: Never true    Inability: Never true  . Transportation needs:    Medical: No    Non-medical: No  Tobacco Use  . Smoking status: Never Smoker  . Smokeless tobacco: Never Used  . Tobacco comment: smoking cessation materials not required  Substance and Sexual Activity  . Alcohol use: No    Alcohol/week: 0.0 standard drinks  . Drug use: No  . Sexual activity: Not Currently  Lifestyle  . Physical activity:    Days per week: 0 days    Minutes per session: 0 min  . Stress: Not at all  Relationships  . Social connections:    Talks on phone: More than three times a week    Gets together: Three times a week    Attends religious service: More than 4 times per year    Active member of club or organization: No    Attends meetings of clubs or organizations: Never    Relationship status: Married  Other Topics Concern  . Not on file  Social History Narrative  . Not on file    Outpatient Encounter Medications as of 04/11/2019  Medication Sig  . ACCU-CHEK AVIVA PLUS test strip USE TO CHECK BLOOD SUGAR  DAILY  . ACCU-CHEK SOFTCLIX LANCETS lancets 1 each by Other route 2 (two) times daily. Use as instructed  . anastrozole (ARIMIDEX) 1 MG tablet TAKE 1 TABLET BY MOUTH  DAILY  . atorvastatin (LIPITOR) 10 MG tablet TAKE 1 TABLET BY MOUTH  DAILY  . fluticasone (FLONASE) 50 MCG/ACT nasal spray Place 2 sprays into both nostrils daily.  . metoprolol succinate (TOPROL-XL) 25 MG 24 hr tablet TAKE 1 TABLET BY MOUTH TWO  TIMES DAILY  . Multiple Vitamins-Calcium (ONE-A-DAY WOMENS PO) Take 1 tablet by mouth every other day.  [name] Kitchen omeprazole (PRILOSEC) 20 MG  capsule TAKE 1 CAPSULE BY MOUTH  DAILY   No facility-administered encounter medications on file as of 04/11/2019.     Activities of Daily Living In your present state of health, do you have any difficulty performing the following activities: 04/11/2019  Hearing? Y  Comment declines hearing aids  Vision? N  Difficulty concentrating or making decisions? N  Walking or climbing stairs? N  Dressing or bathing? N  Doing errands, shopping? N  Preparing Food and eating ? N  Using the Toilet? N  In the past six months, have you accidently  leaked urine? N  Do you have problems with loss of bowel control? N  Managing your Medications? N  Managing your Finances? N  Housekeeping or managing your Housekeeping? N  Some recent data might be hidden    Patient Care Team: Glean Hess, MD as PCP - General (Internal Medicine) Sindy Guadeloupe, MD as Consulting Physician (Oncology)    Assessment:   This is a routine wellness examination for Brandy Diaz.  Exercise Activities and Dietary recommendations Current Exercise Habits: The patient does not participate in regular exercise at present, Exercise limited by: None identified  Goals    . DIET - INCREASE WATER INTAKE     Recommend to drink at least 6-8 8oz glasses of water per day.       Fall Risk Fall Risk  04/11/2019 08/16/2018 04/15/2018 04/07/2018 01/19/2017  Falls in the past year? 0 No No No No  Number falls in past yr: 0 - - - -  Injury with Fall? 0 - - - -  Risk for fall due to : - - - Impaired vision -  Risk for fall due to: Comment - - - wears eyeglasses -  Follow up Falls prevention discussed - - - -   FALL RISK PREVENTION PERTAINING TO THE HOME:  Any stairs in or around the home? Yes  If so, do they handrails? Yes   Home free of loose throw rugs in walkways, pet beds, electrical cords, etc? Yes  Adequate lighting in your home to reduce risk of falls? Yes   ASSISTIVE DEVICES UTILIZED TO PREVENT FALLS:  Life alert? No  Use of a cane,  walker or w/c? No  Grab bars in the bathroom? No  Shower chair or bench in shower? No  Elevated toilet seat or a handicapped toilet? No   DME ORDERS:  DME order needed?  No   TIMED UP AND GO:  Was the test performed? Yes .  Length of time to ambulate 10 feet: 5 sec.   GAIT:  Appearance of gait: Gait stead-fast and without the use of an assistive device.   Education: Fall risk prevention has been discussed.  Intervention(s) required? No    Depression Screen PHQ 2/9 Scores 04/11/2019 08/16/2018 04/15/2018 01/19/2017  PHQ - 2 Score 2 0 0 2  PHQ- 9 Score 9 - - 7     Cognitive Function     6CIT Screen 04/11/2019 04/07/2018 01/19/2017  What Year? 0 points 0 points 0 points  What month? 0 points 0 points 0 points  What time? 0 points 0 points 0 points  Count back from 20 0 points 0 points 0 points  Months in reverse 0 points 0 points 2 points  Repeat phrase 6 points 4 points 0 points  Total Score 6 4 2     Immunization History  Administered Date(s) Administered  . Influenza Split 08/24/2017  . Influenza, High Dose Seasonal PF 08/18/2017, 08/16/2018  . Influenza-Unspecified 09/26/2015, 08/24/2016, 08/24/2017  . Pneumococcal Conjugate-13 12/27/2014  . Pneumococcal Polysaccharide-23 11/11/2012, 05/05/2013, 05/05/2013  . Tdap 01/20/2013, 05/11/2013    Qualifies for Shingles Vaccine? Yes . Due for Shingrix. Education has been provided regarding the importance of this vaccine. Pt has been advised to call insurance company to determine out of pocket expense. Advised may also receive vaccine at local pharmacy or Health Dept. Verbalized acceptance and understanding.   Tdap: Up to date  Flu Vaccine: Up to date  Pneumococcal Vaccine: Up to date   Screening Tests Health Maintenance  Topic Date Due  . OPHTHALMOLOGY EXAM  04/01/2017  . HEMOGLOBIN A1C  02/15/2019  . URINE MICROALBUMIN  04/16/2019  . Hepatitis C Screening  12/30/2020 (Originally 28-Apr-1946)  . MAMMOGRAM  05/01/2019   . INFLUENZA VACCINE  06/11/2019  . FOOT EXAM  08/17/2019  . COLONOSCOPY  08/02/2022  . TETANUS/TDAP  05/12/2023  . DEXA SCAN  Completed  . PNA vac Low Risk Adult  Completed    Cancer Screenings:  Colorectal Screening: Completed 08/02/12. Repeat every 10 years.   Mammogram: Completed 04/30/18. Repeat every year;  Ordered today. Pt provided with contact information and advised to call to schedule appt.   Bone Density: Completed 04/28/17. Results reflect  OSTEOPENIA. Repeat every 2 years. Ordered today. Pt provided with contact information and advised to call to schedule appt.   Lung Cancer Screening: (Low Dose CT Chest recommended if Age 32-80 years, 30 pack-year currently smoking OR have quit w/in 15years.) does not qualify.   Additional Screening:  Hepatitis C Screening: postponed  Vision Screening: Recommended annual ophthalmology exams for early detection of glaucoma and other disorders of the eye. Is the patient up to date with their annual eye exam?  No  Who is the provider or what is the name of the office in which the pt attends annual eye exams? Dr. Edison Pace  Dental Screening: Recommended annual dental exams for proper oral hygiene  Community Resource Referral:  CRR required this visit?  No      Plan:     I have personally reviewed and addressed the Medicare Annual Wellness questionnaire and have noted the following in the patient's chart:  A. Medical and social history B. Use of alcohol, tobacco or illicit drugs  C. Current medications and supplements D. Functional ability and status E.  Nutritional status F.  Physical activity G. Advance directives H. List of other physicians I.  Hospitalizations, surgeries, and ER visits in previous 12 months J.  [place] such as hearing and vision if needed, cognitive and depression L. Referrals and appointments   In addition, I have reviewed and discussed with patient certain preventive protocols, quality metrics,  and best practice recommendations. A written personalized care plan for preventive services as well as general preventive health recommendations were provided to patient.   Signed,  Clemetine Marker, LPN Nurse Health Advisor   Nurse Notes: pt doing well and appreciative of visit today. She c/o trouble sleeping at times and low energy but enjoys gardening in her yard. BP elevated today, 1st reading 170/90 and 2nd reading 166/84. Dr. Army Melia notified and pt to follow up at yearly exam scheduled for next week.

## 2019-04-18 ENCOUNTER — Other Ambulatory Visit: Payer: Self-pay

## 2019-04-18 ENCOUNTER — Encounter: Payer: Self-pay | Admitting: Oncology

## 2019-04-18 ENCOUNTER — Ambulatory Visit (INDEPENDENT_AMBULATORY_CARE_PROVIDER_SITE_OTHER): Payer: Medicare Other | Admitting: Internal Medicine

## 2019-04-18 ENCOUNTER — Inpatient Hospital Stay: Payer: Medicare Other | Attending: Oncology | Admitting: Oncology

## 2019-04-18 ENCOUNTER — Encounter: Payer: Self-pay | Admitting: Internal Medicine

## 2019-04-18 ENCOUNTER — Other Ambulatory Visit: Payer: Self-pay | Admitting: Internal Medicine

## 2019-04-18 VITALS — BP 156/86 | HR 62 | Ht 66.0 in | Wt 142.0 lb

## 2019-04-18 VITALS — BP 158/77 | Temp 98.2°F | Wt 140.4 lb

## 2019-04-18 DIAGNOSIS — E1169 Type 2 diabetes mellitus with other specified complication: Secondary | ICD-10-CM

## 2019-04-18 DIAGNOSIS — I10 Essential (primary) hypertension: Secondary | ICD-10-CM | POA: Diagnosis not present

## 2019-04-18 DIAGNOSIS — Z79899 Other long term (current) drug therapy: Secondary | ICD-10-CM | POA: Diagnosis not present

## 2019-04-18 DIAGNOSIS — E119 Type 2 diabetes mellitus without complications: Secondary | ICD-10-CM | POA: Diagnosis not present

## 2019-04-18 DIAGNOSIS — Z853 Personal history of malignant neoplasm of breast: Secondary | ICD-10-CM

## 2019-04-18 DIAGNOSIS — Z79811 Long term (current) use of aromatase inhibitors: Secondary | ICD-10-CM | POA: Diagnosis not present

## 2019-04-18 DIAGNOSIS — Z08 Encounter for follow-up examination after completed treatment for malignant neoplasm: Secondary | ICD-10-CM | POA: Diagnosis not present

## 2019-04-18 DIAGNOSIS — E1122 Type 2 diabetes mellitus with diabetic chronic kidney disease: Secondary | ICD-10-CM | POA: Diagnosis not present

## 2019-04-18 DIAGNOSIS — E785 Hyperlipidemia, unspecified: Secondary | ICD-10-CM

## 2019-04-18 DIAGNOSIS — N183 Chronic kidney disease, stage 3 unspecified: Secondary | ICD-10-CM

## 2019-04-18 DIAGNOSIS — Z803 Family history of malignant neoplasm of breast: Secondary | ICD-10-CM | POA: Insufficient documentation

## 2019-04-18 DIAGNOSIS — Z0000 Encounter for general adult medical examination without abnormal findings: Secondary | ICD-10-CM | POA: Diagnosis not present

## 2019-04-18 DIAGNOSIS — K219 Gastro-esophageal reflux disease without esophagitis: Secondary | ICD-10-CM

## 2019-04-18 DIAGNOSIS — M8580 Other specified disorders of bone density and structure, unspecified site: Secondary | ICD-10-CM

## 2019-04-18 DIAGNOSIS — Z170 Estrogen receptor positive status [ER+]: Secondary | ICD-10-CM | POA: Insufficient documentation

## 2019-04-18 DIAGNOSIS — D0512 Intraductal carcinoma in situ of left breast: Secondary | ICD-10-CM | POA: Insufficient documentation

## 2019-04-18 DIAGNOSIS — F39 Unspecified mood [affective] disorder: Secondary | ICD-10-CM

## 2019-04-18 MED ORDER — GLUCOSE BLOOD VI STRP: 1.0000 | ORAL_STRIP | Freq: Every day | 3 refills | Status: DC

## 2019-04-18 MED ORDER — ACCU-CHEK SOFTCLIX LANCETS MISC: 1.0000 | Freq: Two times a day (BID) | 12 refills | Status: DC

## 2019-04-18 MED ORDER — IRBESARTAN 150 MG PO TABS: 150.0000 mg | ORAL_TABLET | Freq: Every day | ORAL | 1 refills | Status: DC

## 2019-04-18 NOTE — Patient Instructions (Signed)
Start the new blood pressure medication,  Irbesartan, along with your current medications.  Call me if you have side effects.

## 2019-04-18 NOTE — Progress Notes (Signed)
Hematology/Oncology Consult note [facility]  [phone] [phone]  Patient Care Team: Glean Hess, MD as PCP - General (Internal Medicine) Sindy Guadeloupe, MD as Consulting Physician (Oncology) Eulogio Bear, MD as Consulting Physician (Ophthalmology)   Name of the patient: Brandy Diaz  [id number]  [birth year]   Date of visit: 04/18/19  Diagnosis- left breast ER+ DCIS   Chief complaint/ Reason for visit-routine follow-up of DCIS  Heme/Onc history: Patient is a 73 year old female with a past medical history significant for hypertension and diet-controlled diabetes she recently underwent screening mammogram which showed an abnormal mass in the left breast 3 cm from the nipple 1 x 0.9 x 0.3 cm in size and mobile mass at 4:00 position 0.8 x 0.7 x 0.5 cm in size. Evaluation of axilla was negative for adenopathy. This was followed by ultrasound and the mass at the 5:30 position was stable long-term and consistent with benign lesion and core biopsy of the 4:00 position mass was recommended.  2. 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   3. 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 No necrosis was identified. Margins were negative for DCIS 8 mm from the deep margin. ER 5190% positive PR negative  4. Patient lives with her husband and is independent of her ADLs and IADLs. Menarche at 46 years. She does not have any children. She never breast-fed and was never on hormone replacement therapy. She has a remote history of hysterectomy probably secondary to fibroids. She has been having on and off leg swelling but it has been particularly worse over the last 1 week and her swelling has not subsided. Denies any shortness of breath  5.Recent bone density scan in June 2018 showed T  score of -2.2 consistent with osteopenia. 10 year probability of a major osteoporotic fracture was 6% andhipfracture was 1.5%. Patient started on arimidex in June 2018   Interval history-she is tolerating Arimidex well and has some mild self-limited hot flashes.  She also continues to take calcium and vitamin D.  Reports no new concerns at this time.  Appetite is good and she has not had any unintentional weight loss.  Denies any new aches or pains anywhere  ECOG PS- 1 Pain scale- 0   Review of systems- Review of Systems  Constitutional: Negative for chills, fever, malaise/fatigue and weight loss.  HENT: Negative for congestion, ear discharge and nosebleeds.   Eyes: Negative for blurred vision.  Respiratory: Negative for cough, hemoptysis, sputum production, shortness of breath and wheezing.   Cardiovascular: Negative for chest pain, palpitations, orthopnea and claudication.  Gastrointestinal: Negative for abdominal pain, blood in stool, constipation, diarrhea, heartburn, melena, nausea and vomiting.  Genitourinary: Negative for dysuria, flank pain, frequency, hematuria and urgency.  Musculoskeletal: Negative for back pain, joint pain and myalgias.  Skin: Negative for rash.  Neurological: Negative for dizziness, tingling, focal weakness, seizures, weakness and headaches.  Endo/Heme/Allergies: Does not bruise/bleed easily.       Hot flashes  Psychiatric/Behavioral: Negative for depression and suicidal ideas. The patient does not have insomnia.       Allergies  Allergen Reactions  . Ace Inhibitors Other (See Comments)    Cough  . Hydrochlorothiazide Other (See Comments)    Dizziness  . Triamterene-Hctz Other (See Comments)    Appetite loss     Past  Medical History:  Diagnosis Date  . Anemia   . Breast cancer ([place])   . Diabetes mellitus without complication (HCC)    diet controlled  . Edema    ankles  . GERD (gastroesophageal reflux disease)   . Hypertension       Past Surgical History:  Procedure Laterality Date  . ABDOMINAL HYSTERECTOMY  1980  . 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 BREAST LUMPECTOMY WITH NEEDLE LOCALIZATION Left 04/07/2017   Procedure: BREAST LUMPECTOMY WITH NEEDLE LOCALIZATION;  Surgeon: Vickie Epley, MD;  Location: [facility] ORS;  Service: General;  Laterality: Left;  . BREAST SURGERY Left    Breast Biopsy  . CATARACT EXTRACTION W/PHACO Right 06/12/2016   Procedure: CATARACT EXTRACTION PHACO AND INTRAOCULAR LENS PLACEMENT (IOC);  Surgeon: Eulogio Bear, MD;  Location: [facility] ORS;  Service: Ophthalmology;  Laterality: Right;  [place] 6.1 AP% 12.7 CDE .77 FLUID PACK LOT # [id number]  . COLONOSCOPY    . EYE SURGERY Right    Cataract Extraction with IOL    Social History   Socioeconomic History  . Marital status: Married    Spouse name: Not on file  . Number of children: 0  . Years of education: Not on file  . Highest education level: 10th grade  Occupational History  . Occupation: Retired  Scientific laboratory technician  . Financial resource strain: Not very hard  . Food insecurity:    Worry: Never true    Inability: Never true  . Transportation needs:    Medical: No    Non-medical: No  Tobacco Use  . Smoking status: Never Smoker  . Smokeless tobacco: Never Used  . Tobacco comment: smoking cessation materials not required  Substance and Sexual Activity  . Alcohol use: No    Alcohol/week: 0.0 standard drinks  . Drug use: No  . Sexual activity: Not Currently  Lifestyle  . Physical activity:    Days per week: 0 days    Minutes per session: 0 min  . Stress: Not at all  Relationships  . Social connections:    Talks on phone: More than three times a week    Gets together: Three times a week    Attends religious service: More than 4 times per year    Active member of club or organization: No    Attends meetings of clubs or organizations: Never    Relationship  status: Married  . Intimate partner violence:    Fear of current or ex partner: No    Emotionally abused: No    Physically abused: No    Forced sexual activity: No  Other Topics Concern  . Not on file  Social History Narrative  . Not on file    Family History  Problem Relation Age of Onset  . Breast cancer Mother 67  . Diabetes Mother   . Hypertension Mother   . CAD Father   . Breast cancer Sister   . Hypertension Sister   . Breast cancer Maternal Aunt 68  . Throat cancer Brother      Current Outpatient Medications:  .  Accu-Chek Softclix Lancets lancets, 1 each by Other route 2 (two) times daily. Use as instructed, Disp: 100 each, Rfl: 12 .  anastrozole (ARIMIDEX) 1 MG tablet, TAKE 1 TABLET BY MOUTH  DAILY, Disp: 90 tablet, Rfl: 0 .  atorvastatin (LIPITOR) 10 MG tablet, TAKE 1 TABLET BY MOUTH  DAILY, Disp: 90  tablet, Rfl: 1 .  fluticasone (FLONASE) 50 MCG/ACT nasal spray, Place 2 sprays into both nostrils daily., Disp: 48 g, Rfl: 3 .  glucose blood (ACCU-CHEK AVIVA PLUS) test strip, 1 each by Other route daily at 2 PM. Use as instructed, Disp: 100 each, Rfl: 3 .  irbesartan (AVAPRO) 150 MG tablet, Take 1 tablet (150 mg total) by mouth daily., Disp: 90 tablet, Rfl: 1 .  metoprolol succinate (TOPROL-XL) 25 MG 24 hr tablet, TAKE 1 TABLET BY MOUTH TWO  TIMES DAILY, Disp: 180 tablet, Rfl: 1 .  Multiple Vitamins-Calcium (ONE-A-DAY WOMENS PO), Take 1 tablet by mouth every other day., Disp: , Rfl:  .  omeprazole (PRILOSEC) 20 MG capsule, TAKE 1 CAPSULE BY MOUTH  DAILY, Disp: 90 capsule, Rfl: 1  Physical exam:  Vitals:   04/18/19 1450  BP: (!) 158/77  Temp: 98.2 F (36.8 C)  TempSrc: Tympanic  Weight: 140 lb 6.4 oz (63.7 kg)   Physical Exam Constitutional:      General: She is not in acute distress. HENT:     Head: Normocephalic and atraumatic.  Eyes:     Pupils: Pupils are equal, round, and reactive to light.  Neck:     Musculoskeletal: Normal range of motion.   Cardiovascular:     Rate and Rhythm: Normal rate and regular rhythm.     Heart sounds: Normal heart sounds.  Pulmonary:     Effort: Pulmonary effort is normal.     Breath sounds: Normal breath sounds.  Abdominal:     General: Bowel sounds are normal.     Palpations: Abdomen is soft.  Musculoskeletal:     Comments: Bilateral ankle edema  Skin:    General: Skin is warm and dry.  Neurological:     Mental Status: She is alert and oriented to person, place, and time.     Breast exam was performed in seated and lying down position. Patient is status post left lumpectomy with a well-healed surgical scar. No evidence of any palpable masses. No evidence of axillary adenopathy. No evidence of any palpable masses or lumps in the right breast. No evidence of right axillary adenopathy   CMP Latest Ref Rng & Units 08/16/2018  Glucose 65 - 99 mg/dL 78  BUN 8 - 27 mg/dL 11  Creatinine 0.57 - 1.00 mg/dL 1.07(H)  Sodium 134 - 144 mmol/L 142  Potassium 3.5 - 5.2 mmol/L 4.2  Chloride 96 - 106 mmol/L 101  CO2 20 - 29 mmol/L 23  Calcium 8.7 - 10.3 mg/dL 9.5  Total Protein 6.0 - 8.5 g/dL 7.2  Total Bilirubin 0.0 - 1.2 mg/dL 0.3  Alkaline Phos 39 - 117 IU/L 87  AST 0 - 40 IU/L 19  ALT 0 - 32 IU/L 12   CBC Latest Ref Rng & Units 04/15/2018  WBC 3.4 - 10.8 x10E3/uL 4.2  Hemoglobin 11.1 - 15.9 g/dL 12.6  Hematocrit 34.0 - 46.6 % 38.6  Platelets 150 - 450 x10E3/uL 320     Assessment and plan- Patient is a 73 y.o. female with left breast ER positive DCIS currently on Arimidex  Patient has completed 2 years of Arimidex and will continue to take Arimidex for 3 more years along with calcium and vitamin D.  Clinically patient is doing well and there are no concerning signs and symptoms of recurrence on today's exam.  He is due for a repeat mammogram and bone density scan at this time which has been ordered by her primary care doctor and  we will try to get that scheduled for her.  I will see her back in  6 months.  No labs   Visit Diagnosis 1. Encounter for follow-up surveillance of breast cancer      Dr. Randa Evens, MD, MPH [facility] [phone] 04/18/2019 3:00 PM

## 2019-04-18 NOTE — Addendum Note (Signed)
Addended by: Luella Cook on: 04/18/2019 03:11 PM   Modules accepted: Orders

## 2019-04-18 NOTE — Progress Notes (Signed)
Date:  04/18/2019   Name:  Brandy Diaz   DOB:  01/25/1946   MRN:  [id number]   Chief Complaint: Annual Exam (Breast Exam. ); Hypertension; and Depression (10- phq9) Brandy Diaz is a 73 y.o. female who presents today for her Complete Annual Exam. She feels fairly well. She reports exercising some. She reports she is sleeping fairly well. She denies breast issues. She is due for mammogram and DEXA.  She remains on AI for breast cancer and is followed by Oncology.  Colonoscopy 2013 Immunizations are up to date  Hypertension  This is a chronic problem. The problem is uncontrolled. Pertinent negatives include no chest pain, headaches, palpitations or shortness of breath. Past treatments include beta blockers. The current treatment provides moderate improvement. There are no compliance problems.  There is no history of kidney disease, CAD/MI or CVA.  Depression         This is a new problem.  The problem occurs every several days.The problem is unchanged.  Associated symptoms include sad (struggling some with covid restrictions and not being able to see her family).  Associated symptoms include no fatigue, no headaches and no suicidal ideas. Hyperlipidemia  This is a chronic problem. The problem is controlled. Pertinent negatives include no chest pain or shortness of breath. Current antihyperlipidemic treatment includes statins. The current treatment provides significant improvement of lipids.  Diabetes  She presents for her follow-up diabetic visit. She has type 2 diabetes mellitus. Her disease course has been stable. Pertinent negatives for hypoglycemia include no confusion, dizziness, headaches or tremors. Pertinent negatives for diabetes include no chest pain, no fatigue, no polydipsia and no polyuria. Pertinent negatives for diabetic complications include no CVA. Her weight is stable. She is following a generally healthy diet. She monitors blood glucose at home 1-2 x per day. Blood  glucose monitoring compliance is excellent. There is no change in her home blood glucose trend. Her breakfast blood glucose is taken between 7-8 am. Her breakfast blood glucose range is generally 90-110 mg/dl. An ACE inhibitor/angiotensin II receptor blocker is not being taken. Eye exam is current.   Lab Results  Component Value Date   CREATININE 1.07 (H) 08/16/2018   BUN 11 08/16/2018   NA 142 08/16/2018   K 4.2 08/16/2018   CL 101 08/16/2018   CO2 23 08/16/2018   Lab Results  Component Value Date   WBC 4.2 04/15/2018   HGB 12.6 04/15/2018   HCT 38.6 04/15/2018   MCV 90 04/15/2018   PLT 320 04/15/2018     Review of Systems  Constitutional: Negative for chills, fatigue and fever.  HENT: Negative for congestion, hearing loss, tinnitus, trouble swallowing and voice change.   Eyes: Negative for visual disturbance.  Respiratory: Negative for cough, chest tightness, shortness of breath and wheezing.   Cardiovascular: Negative for chest pain, palpitations and leg swelling.  Gastrointestinal: Negative for abdominal pain, constipation, diarrhea and vomiting.  Endocrine: Negative for polydipsia and polyuria.  Genitourinary: Negative for dysuria, frequency, genital sores, vaginal bleeding and vaginal discharge.  Musculoskeletal: Negative for arthralgias, gait problem and joint swelling.  Skin: Negative for color change and rash.  Neurological: Negative for dizziness, tremors, light-headedness and headaches.  Hematological: Negative for adenopathy. Does not bruise/bleed easily.  Psychiatric/Behavioral: Positive for depression and dysphoric mood. Negative for agitation, confusion, sleep disturbance and suicidal ideas.    Patient Active Problem List   Diagnosis Date Noted  . Ductal carcinoma in situ (DCIS) of left breast  05/21/2017  . Localized edema 04/28/2017  . Essential (primary) hypertension 07/24/2015  . Gastro-esophageal reflux disease without esophagitis 07/24/2015  .  Hyperlipidemia associated with type 2 diabetes mellitus ([place]) 07/24/2015  . Type 2 diabetes mellitus with stage 3 chronic kidney disease, without long-term current use of insulin ([place]) 07/24/2015  . Unexplained weight loss 07/24/2015  . Fibrocystic breast changes 07/24/2015  . Anxiety 05/24/2012    Allergies  Allergen Reactions  . Ace Inhibitors Other (See Comments)    Cough  . Hydrochlorothiazide Other (See Comments)    Dizziness  . Triamterene-Hctz Other (See Comments)    Appetite loss    Past Surgical History:  Procedure Laterality Date  . ABDOMINAL HYSTERECTOMY  1980  . 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 BREAST LUMPECTOMY WITH NEEDLE LOCALIZATION Left 04/07/2017   Procedure: BREAST LUMPECTOMY WITH NEEDLE LOCALIZATION;  Surgeon: Vickie Epley, MD;  Location: [facility] ORS;  Service: General;  Laterality: Left;  . BREAST SURGERY Left    Breast Biopsy  . CATARACT EXTRACTION W/PHACO Right 06/12/2016   Procedure: CATARACT EXTRACTION PHACO AND INTRAOCULAR LENS PLACEMENT (IOC);  Surgeon: Eulogio Bear, MD;  Location: [facility] ORS;  Service: Ophthalmology;  Laterality: Right;  [place] 6.1 AP% 12.7 CDE .77 FLUID PACK LOT # [id number]  . COLONOSCOPY    . EYE SURGERY Right    Cataract Extraction with IOL    Social History   Tobacco Use  . Smoking status: Never Smoker  . Smokeless tobacco: Never Used  . Tobacco comment: smoking cessation materials not required  Substance Use Topics  . Alcohol use: No    Alcohol/week: 0.0 standard drinks  . Drug use: No     Medication list has been reviewed and updated.  Current Meds  Medication Sig  . Accu-Chek Softclix Lancets lancets 1 each by Other route 2 (two) times daily. Use as instructed  . anastrozole (ARIMIDEX) 1 MG tablet TAKE 1 TABLET BY MOUTH  DAILY  . atorvastatin (LIPITOR) 10 MG tablet TAKE 1 TABLET BY MOUTH  DAILY  . fluticasone (FLONASE) 50 MCG/ACT nasal  spray Place 2 sprays into both nostrils daily.  [name] Kitchen glucose blood (ACCU-CHEK AVIVA PLUS) test strip 1 each by Other route daily at 2 PM. Use as instructed  . metoprolol succinate (TOPROL-XL) 25 MG 24 hr tablet TAKE 1 TABLET BY MOUTH TWO  TIMES DAILY  . Multiple Vitamins-Calcium (ONE-A-DAY WOMENS PO) Take 1 tablet by mouth every other day.  [name] Kitchen omeprazole (PRILOSEC) 20 MG capsule TAKE 1 CAPSULE BY MOUTH  DAILY  . [DISCONTINUED] ACCU-CHEK AVIVA PLUS test strip USE TO CHECK BLOOD SUGAR  DAILY  . [DISCONTINUED] ACCU-CHEK SOFTCLIX LANCETS lancets 1 each by Other route 2 (two) times daily. Use as instructed    PHQ 2/9 Scores 04/18/2019 04/11/2019 08/16/2018 04/15/2018  PHQ - 2 Score 3 2 0 0  PHQ- 9 Score 10 9 - -    BP Readings from Last 3 Encounters:  04/18/19 (!) 156/86  04/11/19 (!) 166/84  10/14/18 (!) 173/91    Physical Exam Vitals signs and nursing note reviewed.  Constitutional:      General: She is not in acute distress.    Appearance: She is well-developed.  HENT:     Head: Normocephalic and atraumatic.     Right Ear: Tympanic membrane and ear canal normal.     Left Ear: Tympanic membrane and ear canal normal.  Nose:     Right Sinus: No maxillary sinus tenderness.     Left Sinus: No maxillary sinus tenderness.  Eyes:     General: No scleral icterus.       Right eye: No discharge.        Left eye: No discharge.     Conjunctiva/sclera: Conjunctivae normal.  Neck:     Musculoskeletal: Normal range of motion. No erythema.     Thyroid: No thyromegaly.     Vascular: No carotid bruit.  Cardiovascular:     Rate and Rhythm: Normal rate and regular rhythm.     Pulses:          Dorsalis pedis pulses are 1+ on the right side and 1+ on the left side.       Posterior tibial pulses are 1+ on the right side and 1+ on the left side.     Heart sounds: Normal heart sounds.  Pulmonary:     Effort: Pulmonary effort is normal. No respiratory distress.     Breath sounds: No wheezing.  Chest:      Breasts:        Right: No mass, nipple discharge, skin change or tenderness.        Left: No mass, nipple discharge, skin change or tenderness.    Abdominal:     General: Abdomen is flat. Bowel sounds are normal.     Palpations: Abdomen is soft.     Tenderness: There is no abdominal tenderness.  Musculoskeletal: Normal range of motion.     Right hip: Normal.     Left hip: Normal.     Left ankle: She exhibits swelling. She exhibits normal range of motion and no deformity.     Right lower leg: No edema.     Left lower leg: 1+ Edema present.  Lymphadenopathy:     Cervical: No cervical adenopathy.  Skin:    General: Skin is warm and dry.     Capillary Refill: Capillary refill takes less than 2 seconds.     Findings: No rash.     Comments: Hyperpigmented flat areas on both lower legs c/w stasis dermatitis - no ulcerations  Mild tenderness around the left ankle  Neurological:     Mental Status: She is alert and oriented to person, place, and time.     Cranial Nerves: No cranial nerve deficit.     Sensory: No sensory deficit.     Deep Tendon Reflexes: Reflexes are normal and symmetric.  Psychiatric:        Attention and Perception: Attention normal.        Mood and Affect: Mood normal.        Speech: Speech normal.        Behavior: Behavior normal.        Thought Content: Thought content normal. Thought content does not include suicidal ideation. Thought content does not include suicidal plan.        Judgment: Judgment normal.     Wt Readings from Last 3 Encounters:  04/18/19 142 lb (64.4 kg)  04/11/19 142 lb (64.4 kg)  10/14/18 142 lb 4.8 oz (64.5 kg)    BP (!) 156/86   Pulse 62   Ht 5\' 6"  (1.676 m)   Wt 142 lb (64.4 kg)   SpO2 97%   BMI 22.92 kg/m   Assessment and Plan: 1. Annual physical exam Normal exam  2. Type 2 diabetes mellitus with stage 3 chronic kidney disease, without long-term current use of  insulin (HCC) Continue healthy diet - glucose blood  (ACCU-CHEK AVIVA PLUS) test strip; 1 each by Other route daily at 2 PM. Use as instructed  Dispense: 100 each; Refill: 3 - Accu-Chek Softclix Lancets lancets; 1 each by Other route 2 (two) times daily. Use as instructed  Dispense: 100 each; Refill: 12 - Microalbumin / creatinine urine ratio - Hemoglobin A1c  3. Essential (primary) hypertension Not controlled; will add avapro Recheck in 6 weeks - irbesartan (AVAPRO) 150 MG tablet; Take 1 tablet (150 mg total) by mouth daily.  Dispense: 90 tablet; Refill: 1 - TSH  4. Hyperlipidemia associated with type 2 diabetes mellitus (HCC) Continue statin therapy - Comprehensive metabolic panel - Lipid panel  5. Gastro-esophageal reflux disease without esophagitis Controlled on omeprazole - CBC with Differential/Platelet  6. Ductal carcinoma in situ (DCIS) of left breast Continue Arimidex and oncology follow up Will schedule Mammogram and DEXA  7. Mood disorder [facility]) Discussed with patient - she does not feel that she needs medication at this time - she is advise to call for follow up if worsening Otherwise, will reassess at HTN follow up in 6 weeks   Partially dictated using [place]. Any errors are unintentional.  Halina Maidens, MD [facility]  04/18/2019

## 2019-04-19 LAB — CBC WITH DIFFERENTIAL/PLATELET
Basophils Absolute: 0.1 10*3/uL (ref 0.0–0.2)
Basos: 1 %
EOS (ABSOLUTE): 0.2 10*3/uL (ref 0.0–0.4)
Eos: 4 %
Hematocrit: 41.4 % (ref 34.0–46.6)
Hemoglobin: 13.3 g/dL (ref 11.1–15.9)
Immature Grans (Abs): 0 10*3/uL (ref 0.0–0.1)
Immature Granulocytes: 1 %
Lymphocytes Absolute: 1.1 10*3/uL (ref 0.7–3.1)
Lymphs: 21 %
MCH: 29.1 pg (ref 26.6–33.0)
MCHC: 32.1 g/dL (ref 31.5–35.7)
MCV: 91 fL (ref 79–97)
Monocytes Absolute: 0.4 10*3/uL (ref 0.1–0.9)
Monocytes: 9 %
Neutrophils Absolute: 3.3 10*3/uL (ref 1.4–7.0)
Neutrophils: 64 %
Platelets: 319 10*3/uL (ref 150–450)
RBC: 4.57 x10E6/uL (ref 3.77–5.28)
RDW: 13.1 % (ref 11.7–15.4)
WBC: 5 10*3/uL (ref 3.4–10.8)

## 2019-04-19 LAB — LIPID PANEL
Chol/HDL Ratio: 2.3 ratio (ref 0.0–4.4)
Cholesterol, Total: 176 mg/dL (ref 100–199)
HDL: 78 mg/dL (ref >39)
LDL Calculated: 85 mg/dL (ref 0–99)
Triglycerides: 67 mg/dL (ref 0–149)
VLDL Cholesterol Cal: 13 mg/dL (ref 5–40)

## 2019-04-19 LAB — COMPREHENSIVE METABOLIC PANEL
ALT: 13 IU/L (ref 0–32)
AST: 20 IU/L (ref 0–40)
Albumin/Globulin Ratio: 1.3 (ref 1.2–2.2)
Albumin: 4.3 g/dL (ref 3.7–4.7)
Alkaline Phosphatase: 98 IU/L (ref 39–117)
BUN/Creatinine Ratio: 9 — ABNORMAL LOW (ref 12–28)
BUN: 10 mg/dL (ref 8–27)
Bilirubin Total: 0.4 mg/dL (ref 0.0–1.2)
CO2: 25 mmol/L (ref 20–29)
Calcium: 10 mg/dL (ref 8.7–10.3)
Chloride: 98 mmol/L (ref 96–106)
Creatinine, Ser: 1.12 mg/dL — ABNORMAL HIGH (ref 0.57–1.00)
GFR calc Af Amer: 56 mL/min/{1.73_m2} — ABNORMAL LOW (ref >59)
GFR calc non Af Amer: 49 mL/min/{1.73_m2} — ABNORMAL LOW (ref >59)
Globulin, Total: 3.4 g/dL (ref 1.5–4.5)
Glucose: 128 mg/dL — ABNORMAL HIGH (ref 65–99)
Potassium: 4.2 mmol/L (ref 3.5–5.2)
Sodium: 139 mmol/L (ref 134–144)
Total Protein: 7.7 g/dL (ref 6.0–8.5)

## 2019-04-19 LAB — HEMOGLOBIN A1C
Est. average glucose Bld gHb Est-mCnc: 134 mg/dL
Hgb A1c MFr Bld: 6.3 % — ABNORMAL HIGH (ref 4.8–5.6)

## 2019-04-19 LAB — TSH: TSH: 1.21 u[IU]/mL (ref 0.450–4.500)

## 2019-04-21 LAB — SPECIMEN STATUS REPORT

## 2019-04-21 LAB — MICROALBUMIN / CREATININE URINE RATIO
Creatinine, Urine: 71.9 mg/dL
Microalb/Creat Ratio: 5 mg/g creat (ref 0–29)
Microalbumin, Urine: 3.3 ug/mL

## 2019-05-26 ENCOUNTER — Other Ambulatory Visit: Payer: Medicare Other

## 2019-06-01 ENCOUNTER — Encounter: Payer: Self-pay | Admitting: Internal Medicine

## 2019-06-01 ENCOUNTER — Ambulatory Visit (INDEPENDENT_AMBULATORY_CARE_PROVIDER_SITE_OTHER): Payer: Medicare Other | Admitting: Internal Medicine

## 2019-06-01 ENCOUNTER — Other Ambulatory Visit: Payer: Self-pay

## 2019-06-01 VITALS — BP 128/76 | HR 90 | Ht 66.0 in | Wt 140.0 lb

## 2019-06-01 DIAGNOSIS — I10 Essential (primary) hypertension: Secondary | ICD-10-CM | POA: Diagnosis not present

## 2019-06-01 DIAGNOSIS — F39 Unspecified mood [affective] disorder: Secondary | ICD-10-CM

## 2019-06-01 NOTE — Progress Notes (Signed)
Date:  06/01/2019   Name:  Brandy Diaz   DOB:  [birth year]   MRN:  [id number]   Chief Complaint: Depression (Follow up.) and Hypertension  Depression        This is a chronic problem.  The problem has been gradually improving since onset.  Associated symptoms include no fatigue, no appetite change and no headaches.     The symptoms are aggravated by social issues. Hypertension This is a chronic problem. The problem has been gradually improving since onset. The problem is controlled. Pertinent negatives include no chest pain, headaches, palpitations or shortness of breath. Past treatments include angiotensin blockers (added avapro last visit but 150 mg was too strong, taking 75 mg a day).    Review of Systems  Constitutional: Negative for appetite change, fatigue, fever and unexpected weight change.  HENT: Negative for tinnitus and trouble swallowing.   Eyes: Negative for visual disturbance.  Respiratory: Negative for cough, chest tightness and shortness of breath.   Cardiovascular: Negative for chest pain, palpitations and leg swelling.  Gastrointestinal: Negative for abdominal pain.  Genitourinary: Negative for dysuria and hematuria.  Musculoskeletal: Negative for arthralgias.  Skin: Positive for color change. Negative for rash.  Allergic/Immunologic: Negative for environmental allergies.  Neurological: Negative for tremors, light-headedness, numbness and headaches.  Psychiatric/Behavioral: Positive for depression. Negative for dysphoric mood.    Patient Active Problem List   Diagnosis Date Noted  . Ductal carcinoma in situ (DCIS) of left breast 05/21/2017  . Localized edema 04/28/2017  . Essential (primary) hypertension 07/24/2015  . Gastro-esophageal reflux disease without esophagitis 07/24/2015  . Hyperlipidemia associated with type 2 diabetes mellitus ([place]) 07/24/2015  . Type 2 diabetes mellitus with stage 3 chronic kidney disease, without long-term current use of  insulin ([place]) 07/24/2015  . Unexplained weight loss 07/24/2015  . Fibrocystic breast changes 07/24/2015  . Anxiety 05/24/2012    Allergies  Allergen Reactions  . Ace Inhibitors Other (See Comments)    Cough  . Hydrochlorothiazide Other (See Comments)    Dizziness  . Triamterene-Hctz Other (See Comments)    Appetite loss    Past Surgical History:  Procedure Laterality Date  . ABDOMINAL HYSTERECTOMY  1980  . 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 BREAST LUMPECTOMY WITH NEEDLE LOCALIZATION Left 04/07/2017   Procedure: BREAST LUMPECTOMY WITH NEEDLE LOCALIZATION;  Surgeon: Vickie Epley, MD;  Location: [facility] ORS;  Service: General;  Laterality: Left;  . BREAST SURGERY Left    Breast Biopsy  . CATARACT EXTRACTION W/PHACO Right 06/12/2016   Procedure: CATARACT EXTRACTION PHACO AND INTRAOCULAR LENS PLACEMENT (IOC);  Surgeon: Eulogio Bear, MD;  Location: [facility] ORS;  Service: Ophthalmology;  Laterality: Right;  [place] 6.1 AP% 12.7 CDE .77 FLUID PACK LOT # [id number]  . COLONOSCOPY    . EYE SURGERY Right    Cataract Extraction with IOL    Social History   Tobacco Use  . Smoking status: Never Smoker  . Smokeless tobacco: Never Used  . Tobacco comment: smoking cessation materials not required  Substance Use Topics  . Alcohol use: No    Alcohol/week: 0.0 standard drinks  . Drug use: No     Medication list has been reviewed and updated.  Current Meds  Medication Sig  . Accu-Chek Softclix Lancets lancets 1 each by Other route 2 (two) times daily. Use as instructed  . anastrozole (ARIMIDEX) 1 MG tablet TAKE 1 TABLET  BY MOUTH  DAILY  . atorvastatin (LIPITOR) 10 MG tablet TAKE 1 TABLET BY MOUTH  DAILY  . fluticasone (FLONASE) 50 MCG/ACT nasal spray Place 2 sprays into both nostrils daily.  [name] Kitchen glucose blood (ACCU-CHEK AVIVA PLUS) test strip 1 each by Other route daily at 2 PM. Use as instructed  . irbesartan  (AVAPRO) 150 MG tablet Take 1 tablet (150 mg total) by mouth daily. (Patient taking differently: Take 75 mg by mouth daily. )  . Multiple Vitamins-Calcium (ONE-A-DAY WOMENS PO) Take 1 tablet by mouth every other day.  [name] Kitchen omeprazole (PRILOSEC) 20 MG capsule TAKE 1 CAPSULE BY MOUTH  DAILY    PHQ 2/9 Scores 06/01/2019 04/18/2019 04/11/2019 08/16/2018  PHQ - 2 Score 0 3 2 0  PHQ- 9 Score 2 10 9  -    BP Readings from Last 3 Encounters:  06/01/19 128/76  04/18/19 (!) 158/77  04/18/19 (!) 156/86    Physical Exam Vitals signs and nursing note reviewed.  Constitutional:      General: She is not in acute distress.    Appearance: She is well-developed.  HENT:     Head: Normocephalic and atraumatic.  Cardiovascular:     Rate and Rhythm: Normal rate and regular rhythm.     Pulses: Normal pulses.  Pulmonary:     Effort: Pulmonary effort is normal. No respiratory distress.     Breath sounds: No wheezing or rhonchi.  Musculoskeletal: Normal range of motion.     Right lower leg: No edema.     Left lower leg: No edema.  Lymphadenopathy:     Cervical: No cervical adenopathy.  Skin:    General: Skin is warm and dry.     Capillary Refill: Capillary refill takes less than 2 seconds.     Findings: No rash.  Neurological:     General: No focal deficit present.     Mental Status: She is alert and oriented to person, place, and time.  Psychiatric:        Attention and Perception: Attention normal.        Mood and Affect: Mood normal.        Behavior: Behavior normal.        Thought Content: Thought content normal.     Wt Readings from Last 3 Encounters:  06/01/19 140 lb (63.5 kg)  04/18/19 140 lb 6.4 oz (63.7 kg)  04/18/19 142 lb (64.4 kg)    BP 128/76   Pulse (!) 113   Ht 5\' 6"  (1.676 m)   Wt 140 lb (63.5 kg)   SpO2 97%   BMI 22.60 kg/m   Assessment and Plan: 1. Essential (primary) hypertension Controlled Continue avapro 75 mg per day  2. Mood disorder ([place]) Much improved with no  intervention Will continue to monitor for worsening   Partially dictated using [employer], [place]. Any errors are unintentional.  Halina Maidens, MD [facility]  06/01/2019

## 2019-06-27 ENCOUNTER — Other Ambulatory Visit: Payer: Self-pay | Admitting: Oncology

## 2019-06-30 ENCOUNTER — Other Ambulatory Visit: Payer: Self-pay

## 2019-06-30 DIAGNOSIS — H6982 Other specified disorders of Eustachian tube, left ear: Secondary | ICD-10-CM

## 2019-06-30 MED ORDER — FLUTICASONE PROPIONATE 50 MCG/ACT NA SUSP: 2.0000 | Freq: Every day | NASAL | 3 refills | Status: DC

## 2019-08-03 ENCOUNTER — Other Ambulatory Visit: Payer: Self-pay

## 2019-08-03 NOTE — Patient Outreach (Signed)
[facility]) Care Management  08/03/2019  [name] 09-09-1946 [id number]   Medication Adherence call to Brandy Diaz Compliant Voice message left with a call back number. Brandy Diaz is showing past due on Irbesartan 150 mg under [place].   [facility] Management Direct Dial [phone]  Fax [phone] [email or web address]

## 2019-08-12 ENCOUNTER — Other Ambulatory Visit: Payer: Self-pay | Admitting: Internal Medicine

## 2019-08-12 ENCOUNTER — Telehealth: Payer: Self-pay

## 2019-08-12 DIAGNOSIS — I10 Essential (primary) hypertension: Secondary | ICD-10-CM

## 2019-08-12 MED ORDER — IRBESARTAN 75 MG PO TABS: 75.0000 mg | ORAL_TABLET | Freq: Every day | ORAL | 3 refills | Status: DC

## 2019-08-12 NOTE — Telephone Encounter (Signed)
Advised to call when ready for refill so she can have the BP med changed to 80 mg pills. Mail order okay.

## 2019-08-17 ENCOUNTER — Other Ambulatory Visit: Payer: Self-pay | Admitting: Internal Medicine

## 2019-08-17 DIAGNOSIS — K219 Gastro-esophageal reflux disease without esophagitis: Secondary | ICD-10-CM

## 2019-08-17 DIAGNOSIS — E782 Mixed hyperlipidemia: Secondary | ICD-10-CM

## 2019-09-26 ENCOUNTER — Ambulatory Visit
Admission: RE | Admit: 2019-09-26 | Discharge: 2019-09-26 | Disposition: A | Discharge status: Home / Self Care | Payer: Medicare Other | Source: Ambulatory Visit | Attending: Oncology | Admitting: Oncology

## 2019-09-26 DIAGNOSIS — R922 Inconclusive mammogram: Secondary | ICD-10-CM | POA: Diagnosis not present

## 2019-09-26 DIAGNOSIS — D0512 Intraductal carcinoma in situ of left breast: Secondary | ICD-10-CM

## 2019-09-26 DIAGNOSIS — Z13820 Encounter for screening for osteoporosis: Secondary | ICD-10-CM | POA: Diagnosis not present

## 2019-09-26 DIAGNOSIS — M8589 Other specified disorders of bone density and structure, multiple sites: Secondary | ICD-10-CM | POA: Diagnosis not present

## 2019-09-26 DIAGNOSIS — M8580 Other specified disorders of bone density and structure, unspecified site: Secondary | ICD-10-CM

## 2019-09-26 DIAGNOSIS — Z780 Asymptomatic menopausal state: Secondary | ICD-10-CM | POA: Diagnosis not present

## 2019-10-18 ENCOUNTER — Ambulatory Visit: Payer: Medicare Other | Admitting: Internal Medicine

## 2019-10-18 ENCOUNTER — Inpatient Hospital Stay: Payer: Medicare Other | Admitting: Oncology

## 2019-11-08 ENCOUNTER — Encounter: Payer: Self-pay | Admitting: Internal Medicine

## 2019-11-08 ENCOUNTER — Ambulatory Visit (INDEPENDENT_AMBULATORY_CARE_PROVIDER_SITE_OTHER): Payer: Medicare Other | Admitting: Internal Medicine

## 2019-11-08 ENCOUNTER — Other Ambulatory Visit: Payer: Self-pay

## 2019-11-08 VITALS — BP 136/78 | HR 91 | Temp 97.9°F | Resp 17 | Ht 66.0 in | Wt 139.4 lb

## 2019-11-08 DIAGNOSIS — N183 Chronic kidney disease, stage 3 unspecified: Secondary | ICD-10-CM | POA: Diagnosis not present

## 2019-11-08 DIAGNOSIS — I872 Venous insufficiency (chronic) (peripheral): Secondary | ICD-10-CM

## 2019-11-08 DIAGNOSIS — E1122 Type 2 diabetes mellitus with diabetic chronic kidney disease: Secondary | ICD-10-CM

## 2019-11-08 DIAGNOSIS — I10 Essential (primary) hypertension: Secondary | ICD-10-CM | POA: Diagnosis not present

## 2019-11-08 DIAGNOSIS — K219 Gastro-esophageal reflux disease without esophagitis: Secondary | ICD-10-CM | POA: Diagnosis not present

## 2019-11-08 MED ORDER — TRIAMCINOLONE ACETONIDE 0.1 % EX CREA: 1.0000 "application " | TOPICAL_CREAM | Freq: Two times a day (BID) | CUTANEOUS | 5 refills | Status: DC

## 2019-11-08 NOTE — Progress Notes (Signed)
Date:  11/08/2019   Name:  Brandy Diaz   DOB:  12-19-1945   MRN:  [id number]   Chief Complaint: Hypertension (Home BP avg 115-125/65-80) and Foot Swelling (L and R ankles swelling since Oct off and on and itches. Discoloration on both ankles )  Hypertension This is a chronic problem. The problem is controlled. Pertinent negatives include no chest pain, headaches, palpitations or shortness of breath. Past treatments include angiotensin blockers. The current treatment provides significant improvement.  Diabetes She presents for her follow-up diabetic visit. She has type 2 diabetes mellitus. Pertinent negatives for hypoglycemia include no dizziness or headaches. Pertinent negatives for diabetes include no chest pain and no fatigue. Symptoms are stable. Current diabetic treatment includes diet.  Rash This is a new problem. The current episode started more than 1 month ago. The affected locations include the right ankle and left ankle. The rash is characterized by burning and dryness. Pertinent negatives include no diarrhea, fatigue, fever or shortness of breath.  First there was localized swelling without fever, redness or drainage.  The swelling resolved and now the skin is itching, painful and thickened.  Lab Results  Component Value Date   CREATININE 1.12 (H) 04/18/2019   BUN 10 04/18/2019   NA 139 04/18/2019   K 4.2 04/18/2019   CL 98 04/18/2019   CO2 25 04/18/2019   Lab Results  Component Value Date   CHOL 176 04/18/2019   HDL 78 04/18/2019   LDLCALC 85 04/18/2019   TRIG 67 04/18/2019   CHOLHDL 2.3 04/18/2019   Lab Results  Component Value Date   TSH 1.210 04/18/2019   Lab Results  Component Value Date   HGBA1C 6.3 (H) 04/18/2019     Review of Systems  Constitutional: Negative for appetite change, chills, fatigue, fever and unexpected weight change.  Eyes: Negative for visual disturbance.  Respiratory: Negative for chest tightness and shortness of breath.    Cardiovascular: Positive for leg swelling. Negative for chest pain and palpitations.  Gastrointestinal: Negative for abdominal pain, blood in stool, constipation and diarrhea.  Musculoskeletal: Negative for arthralgias and gait problem.  Skin: Positive for rash.  Neurological: Negative for dizziness, numbness and headaches.    Patient Active Problem List   Diagnosis Date Noted  . Ductal carcinoma in situ (DCIS) of left breast 05/21/2017  . Localized edema 04/28/2017  . Essential (primary) hypertension 07/24/2015  . Gastro-esophageal reflux disease without esophagitis 07/24/2015  . Hyperlipidemia associated with type 2 diabetes mellitus ([place]) 07/24/2015  . Type 2 diabetes mellitus with stage 3 chronic kidney disease, without long-term current use of insulin ([place]) 07/24/2015  . Unexplained weight loss 07/24/2015  . Fibrocystic breast changes 07/24/2015  . Anxiety 05/24/2012    Allergies  Allergen Reactions  . Ace Inhibitors Other (See Comments)    Cough  . Hydrochlorothiazide Other (See Comments)    Dizziness  . Triamterene-Hctz Other (See Comments)    Appetite loss    Past Surgical History:  Procedure Laterality Date  . ABDOMINAL HYSTERECTOMY  1980  . BREAST BIOPSY Left 2018   DCIS  . 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 BREAST LUMPECTOMY WITH NEEDLE LOCALIZATION Left 04/07/2017   Procedure: BREAST LUMPECTOMY WITH NEEDLE LOCALIZATION;  Surgeon: Vickie Epley, MD;  Location: [facility] ORS;  Service: General;  Laterality: Left;  . BREAST SURGERY Left    Breast Biopsy  . CATARACT EXTRACTION W/PHACO Right  06/12/2016   Procedure: CATARACT EXTRACTION PHACO AND INTRAOCULAR LENS PLACEMENT (IOC);  Surgeon: Eulogio Bear, MD;  Location: [facility] ORS;  Service: Ophthalmology;  Laterality: Right;  [place] 6.1 AP% 12.7 CDE .77 FLUID PACK LOT # [id number]  . COLONOSCOPY    . EYE SURGERY Right    Cataract Extraction with IOL   . OOPHORECTOMY      Social History   Tobacco Use  . Smoking status: Never Smoker  . Smokeless tobacco: Never Used  . Tobacco comment: smoking cessation materials not required  Substance Use Topics  . Alcohol use: No    Alcohol/week: 0.0 standard drinks  . Drug use: No     Medication list has been reviewed and updated.  Current Meds  Medication Sig  . Accu-Chek Softclix Lancets lancets 1 each by Other route 2 (two) times daily. Use as instructed  . anastrozole (ARIMIDEX) 1 MG tablet TAKE 1 TABLET BY MOUTH  DAILY  . aspirin 81 MG chewable tablet Chew by mouth daily.  [name] Kitchen atorvastatin (LIPITOR) 10 MG tablet TAKE 1 TABLET BY MOUTH  DAILY  . calcium carbonate (OSCAL) 1500 (600 Ca) MG TABS tablet Take by mouth 2 (two) times daily with a meal.  . fluticasone (FLONASE) 50 MCG/ACT nasal spray Place 2 sprays into both nostrils daily.  [name] Kitchen glucose blood (ACCU-CHEK AVIVA PLUS) test strip 1 each by Other route daily at 2 PM. Use as instructed  . irbesartan (AVAPRO) 75 MG tablet Take 1 tablet (75 mg total) by mouth daily.  . Multiple Vitamins-Calcium (ONE-A-DAY WOMENS PO) Take 1 tablet by mouth every other day.  [name] Kitchen omeprazole (PRILOSEC) 20 MG capsule TAKE 1 CAPSULE BY MOUTH  DAILY    PHQ 2/9 Scores 06/01/2019 04/18/2019 04/11/2019 08/16/2018  PHQ - 2 Score 0 3 2 0  PHQ- 9 Score 2 10 9  -    BP Readings from Last 3 Encounters:  11/08/19 136/78  06/01/19 128/76  04/18/19 (!) 158/77    Physical Exam Vitals and nursing note reviewed.  Constitutional:      General: She is not in acute distress.    Appearance: Normal appearance. She is well-developed.  HENT:     Head: Normocephalic and atraumatic.  Neck:     Vascular: No carotid bruit.  Cardiovascular:     Rate and Rhythm: Normal rate and regular rhythm.     Heart sounds: No murmur.  Pulmonary:     Effort: Pulmonary effort is normal. No respiratory distress.     Breath sounds: No wheezing or rhonchi.  Musculoskeletal:        General: No  swelling or deformity. Normal range of motion.     Cervical back: Normal range of motion.     Right lower leg: No edema.     Left lower leg: No edema.  Lymphadenopathy:     Cervical: No cervical adenopathy.  Skin:    General: Skin is warm and dry.     Findings: Rash present.          Comments: Two patches of skin thickening where the previous skin was swollen.  Skin dry, no drainage, warmth or odor. No obvious varicose veins  Neurological:     General: No focal deficit present.     Mental Status: She is alert and oriented to person, place, and time.  Psychiatric:        Behavior: Behavior normal.        Thought Content: Thought content normal.     Wt Readings  from Last 3 Encounters:  11/08/19 139 lb 6.4 oz (63.2 kg)  06/01/19 140 lb (63.5 kg)  04/18/19 140 lb 6.4 oz (63.7 kg)    BP 136/78   Pulse 91   Temp 97.9 F (36.6 C) (Temporal)   Resp 17   Ht 5\' 6"  (1.676 m)   Wt 139 lb 6.4 oz (63.2 kg)   SpO2 98%   BMI 22.50 kg/m   Assessment and Plan: 1. Essential (primary) hypertension Clinically stable exam with well controlled BP.   Tolerating medications, avapro 75 mg, without side effects at this time. Pt to continue current regimen and low sodium diet; benefits of regular exercise as able discussed.  2. Type 2 diabetes mellitus with stage 3 chronic kidney disease, without long-term current use of insulin, unspecified whether stage 3a or 3b CKD ([place]) BS controlled on diet alone.  No s/s of hypoglycemia. She is reminded to schedule a DM eye exam with the provider of her choice - Basic metabolic panel - Hemoglobin A1c  3. Gastro-esophageal reflux disease without esophagitis Symptoms well controlled on daily PPI No red flag signs such as weight loss, n/v, melena Will continue daily PPI with omeprazole. - CBC with Differential/Platelet  4. Venous stasis dermatitis of both lower extremities If no improvement, will refer to Dermatology - triamcinolone cream (KENALOG)  0.1 %; Apply 1 application topically 2 (two) times daily. Apply to lesions on both lower legs twice a day  Dispense: 30 g; Refill: 5   Partially dictated using Editor, commissioning. Any errors are unintentional.  Halina Maidens, MD [facility]  11/08/2019

## 2019-11-09 LAB — CBC WITH DIFFERENTIAL/PLATELET
Basophils Absolute: 0.1 10*3/uL (ref 0.0–0.2)
Basos: 2 %
EOS (ABSOLUTE): 0.1 10*3/uL (ref 0.0–0.4)
Eos: 3 %
Hematocrit: 37.3 % (ref 34.0–46.6)
Hemoglobin: 12.5 g/dL (ref 11.1–15.9)
Immature Grans (Abs): 0 10*3/uL (ref 0.0–0.1)
Immature Granulocytes: 0 %
Lymphocytes Absolute: 0.9 10*3/uL (ref 0.7–3.1)
Lymphs: 17 %
MCH: 29 pg (ref 26.6–33.0)
MCHC: 33.5 g/dL (ref 31.5–35.7)
MCV: 87 fL (ref 79–97)
Monocytes Absolute: 0.6 10*3/uL (ref 0.1–0.9)
Monocytes: 11 %
Neutrophils Absolute: 3.9 10*3/uL (ref 1.4–7.0)
Neutrophils: 67 %
Platelets: 312 10*3/uL (ref 150–450)
RBC: 4.31 x10E6/uL (ref 3.77–5.28)
RDW: 12.7 % (ref 11.7–15.4)
WBC: 5.7 10*3/uL (ref 3.4–10.8)

## 2019-11-09 LAB — BASIC METABOLIC PANEL
BUN/Creatinine Ratio: 13 (ref 12–28)
BUN: 15 mg/dL (ref 8–27)
CO2: 24 mmol/L (ref 20–29)
Calcium: 9.6 mg/dL (ref 8.7–10.3)
Chloride: 101 mmol/L (ref 96–106)
Creatinine, Ser: 1.17 mg/dL — ABNORMAL HIGH (ref 0.57–1.00)
GFR calc Af Amer: 53 mL/min/{1.73_m2} — ABNORMAL LOW (ref >59)
GFR calc non Af Amer: 46 mL/min/{1.73_m2} — ABNORMAL LOW (ref >59)
Glucose: 101 mg/dL — ABNORMAL HIGH (ref 65–99)
Potassium: 4.4 mmol/L (ref 3.5–5.2)
Sodium: 139 mmol/L (ref 134–144)

## 2019-11-09 LAB — HEMOGLOBIN A1C
Est. average glucose Bld gHb Est-mCnc: 126 mg/dL
Hgb A1c MFr Bld: 6 % — ABNORMAL HIGH (ref 4.8–5.6)

## 2019-11-15 ENCOUNTER — Other Ambulatory Visit: Payer: Self-pay

## 2019-11-15 ENCOUNTER — Inpatient Hospital Stay: Payer: Medicare Other | Attending: Oncology | Admitting: Oncology

## 2019-11-15 ENCOUNTER — Encounter: Payer: Self-pay | Admitting: Oncology

## 2019-11-15 VITALS — BP 176/82 | HR 90 | Temp 97.6°F | Resp 18 | Wt 140.7 lb

## 2019-11-15 DIAGNOSIS — Z5181 Encounter for therapeutic drug level monitoring: Secondary | ICD-10-CM | POA: Diagnosis not present

## 2019-11-15 DIAGNOSIS — Z79811 Long term (current) use of aromatase inhibitors: Secondary | ICD-10-CM

## 2019-11-15 DIAGNOSIS — D0512 Intraductal carcinoma in situ of left breast: Secondary | ICD-10-CM

## 2019-11-15 NOTE — Progress Notes (Signed)
Patient stated that in the last month she started to itch and swell on her bilateral lower extremity. Due to the itching, she has scars. Patient stated that her PCP gave her Kenalog cream to help her with the itching but nothing for the edema.

## 2019-11-17 NOTE — Progress Notes (Addendum)
Hematology/Oncology Consult note [facility]  Telephone:([phone] [phone]  Patient Care Team: Glean Hess, MD as PCP - General (Internal Medicine) Sindy Guadeloupe, MD as Consulting Physician (Oncology) Eulogio Bear, MD as Consulting Physician (Ophthalmology)   Name of the patient: Brandy Diaz  [id number]  [birth year]   Date of visit: 11/17/19  Diagnosis-  left breast ER+ DCIS  Chief complaint/ Reason for visit-routine follow-up of breast cancer on Arimidex  Heme/Onc history: Patient is a 74 year old female with a past medical history significant for hypertension and diet-controlled diabetes she recently underwent screening mammogram which showed an abnormal mass in the left breast 3 cm from the nipple 1 x 0.9 x 0.3 cm in size and mobile mass at 4:00 position 0.8 x 0.7 x 0.5 cm in size. Evaluation of axilla was negative for adenopathy. This was followed by ultrasound and the mass at the 5:30 position was stable long-term and consistent with benign lesion and core biopsy of the 4:00 position mass was recommended.  2. 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   3. 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 No necrosis was identified. Margins were negative for DCIS 8 mm from the deep margin. ER 5190% positive PR negative  4. Patient lives with her husband and is independent of her ADLs and IADLs. Menarche at 38 years. She does not have any children. She never breast-fed and was never on hormone replacement therapy. She has a remote history of hysterectomy probably secondary to fibroids. She has been having on and off leg swelling but it has been particularly worse over the last 1 week and her swelling has not subsided. Denies any shortness of breath  5.Recent bone density scan in  June 2018 showed T score of -2.2 consistent with osteopenia. 10 year probability of a major osteoporotic fracture was 6% andhipfracture was 1.5%. Patient started on arimidex in June 2018   Interval history-patient continues to have bilateral lower extremity swelling likely from stasis dermatitis and has been seeing Dr. Army Melia.  She is using Kenalog ointment over the skin lesions which itch less after starting the ointment.  Denies any significant side effects from Arimidex.  ECOG PS- 1 Pain scale- 0   Review of systems- Review of Systems  Constitutional: Positive for malaise/fatigue. Negative for chills, fever and weight loss.  HENT: Negative for congestion, ear discharge and nosebleeds.   Eyes: Negative for blurred vision.  Respiratory: Negative for cough, hemoptysis, sputum production, shortness of breath and wheezing.   Cardiovascular: Positive for leg swelling. Negative for chest pain, palpitations, orthopnea and claudication.  Gastrointestinal: Negative for abdominal pain, blood in stool, constipation, diarrhea, heartburn, melena, nausea and vomiting.  Genitourinary: Negative for dysuria, flank pain, frequency, hematuria and urgency.  Musculoskeletal: Negative for back pain, joint pain and myalgias.  Skin: Negative for rash.  Neurological: Negative for dizziness, tingling, focal weakness, seizures, weakness and headaches.  Endo/Heme/Allergies: Does not bruise/bleed easily.  Psychiatric/Behavioral: Negative for depression and suicidal ideas. The patient does not have insomnia.       Allergies  Allergen Reactions  . Ace Inhibitors Other (See Comments)    Cough  . Hydrochlorothiazide Other (See Comments)    Dizziness  . Triamterene-Hctz Other (See Comments)    Appetite loss     Past Medical History:  Diagnosis Date  .  Anemia   . Breast cancer ([place]) 2018   Left  . Diabetes mellitus without complication ([facility])    diet controlled  . Edema    ankles  . GERD  (gastroesophageal reflux disease)   . Hypertension      Past Surgical History:  Procedure Laterality Date  . ABDOMINAL HYSTERECTOMY  1980  . BREAST BIOPSY Left 2018   DCIS  . 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 BREAST LUMPECTOMY WITH NEEDLE LOCALIZATION Left 04/07/2017   Procedure: BREAST LUMPECTOMY WITH NEEDLE LOCALIZATION;  Surgeon: Vickie Epley, MD;  Location: [facility] ORS;  Service: General;  Laterality: Left;  . BREAST SURGERY Left    Breast Biopsy  . CATARACT EXTRACTION W/PHACO Right 06/12/2016   Procedure: CATARACT EXTRACTION PHACO AND INTRAOCULAR LENS PLACEMENT (IOC);  Surgeon: Eulogio Bear, MD;  Location: [facility] ORS;  Service: Ophthalmology;  Laterality: Right;  [place] 6.1 AP% 12.7 CDE .77 FLUID PACK LOT # [id number]  . COLONOSCOPY    . EYE SURGERY Right    Cataract Extraction with IOL  . OOPHORECTOMY      Social History   Socioeconomic History  . Marital status: Married    Spouse name: Not on file  . Number of children: 0  . Years of education: Not on file  . Highest education level: 10th grade  Occupational History  . Occupation: Retired  Tobacco Use  . Smoking status: Never Smoker  . Smokeless tobacco: Never Used  . Tobacco comment: smoking cessation materials not required  Substance and Sexual Activity  . Alcohol use: No    Alcohol/week: 0.0 standard drinks  . Drug use: No  . Sexual activity: Not Currently  Other Topics Concern  . Not on file  Social History Narrative  . Not on file   Social Determinants of Health   Financial Resource Strain: Low Risk   . Difficulty of Paying Living Expenses: Not very hard  Food Insecurity: No Food Insecurity  . Worried About Charity fundraiser in the Last Year: Never true  . Ran Out of Food in the Last Year: Never true  Transportation Needs:   . Lack of Transportation (Medical): Not on file  . Lack of Transportation (Non-Medical): Not on file    Physical Activity:   . Days of Exercise per Week: Not on file  . Minutes of Exercise per Session: Not on file  Stress:   . Feeling of Stress : Not on file  Social Connections: Unknown  . Frequency of Communication with Friends and Family: More than three times a week  . Frequency of Social Gatherings with Friends and Family: Three times a week  . Attends Religious Services: More than 4 times per year  . Active Member of Clubs or Organizations: No  . Attends Archivist Meetings: Never  . Marital Status: Not on file  Intimate Partner Violence:   . Fear of Current or Ex-Partner: Not on file  . Emotionally Abused: Not on file  . Physically Abused: Not on file  . Sexually Abused: Not on file    Family History  Problem Relation Age of Onset  . Breast cancer Mother 26  . Diabetes Mother   . Hypertension Mother   . CAD Father   . Breast cancer Sister   . Hypertension Sister   . Breast cancer Maternal Aunt 26  . Throat cancer Brother      Current  Outpatient Medications:  .  Accu-Chek Softclix Lancets lancets, 1 each by Other route 2 (two) times daily. Use as instructed, Disp: 100 each, Rfl: 12 .  anastrozole (ARIMIDEX) 1 MG tablet, TAKE 1 TABLET BY MOUTH  DAILY, Disp: 90 tablet, Rfl: 3 .  aspirin 81 MG chewable tablet, Chew by mouth daily., Disp: , Rfl:  .  atorvastatin (LIPITOR) 10 MG tablet, TAKE 1 TABLET BY MOUTH  DAILY, Disp: 90 tablet, Rfl: 3 .  calcium carbonate (OSCAL) 1500 (600 Ca) MG TABS tablet, Take by mouth 2 (two) times daily with a meal., Disp: , Rfl:  .  fluticasone (FLONASE) 50 MCG/ACT nasal spray, Place 2 sprays into both nostrils daily., Disp: 48 g, Rfl: 3 .  glucose blood (ACCU-CHEK AVIVA PLUS) test strip, 1 each by Other route daily at 2 PM. Use as instructed, Disp: 100 each, Rfl: 3 .  irbesartan (AVAPRO) 75 MG tablet, Take 1 tablet (75 mg total) by mouth daily., Disp: 90 tablet, Rfl: 3 .  Multiple Vitamins-Calcium (ONE-A-DAY WOMENS PO), Take 1 tablet  by mouth every other day., Disp: , Rfl:  .  omeprazole (PRILOSEC) 20 MG capsule, TAKE 1 CAPSULE BY MOUTH  DAILY, Disp: 90 capsule, Rfl: 3 .  triamcinolone cream (KENALOG) 0.1 %, Apply 1 application topically 2 (two) times daily. Apply to lesions on both lower legs twice a day, Disp: 30 g, Rfl: 5  Physical exam:  Vitals:   11/15/19 1438  BP: (!) 176/82  Pulse: 90  Resp: 18  Temp: 97.6 F (36.4 C)  SpO2: 100%  Weight: 140 lb 11.2 oz (63.8 kg)   Physical Exam Constitutional:      General: She is not in acute distress. HENT:     Head: Normocephalic and atraumatic.  Eyes:     Pupils: Pupils are equal, round, and reactive to light.  Cardiovascular:     Rate and Rhythm: Normal rate and regular rhythm.     Heart sounds: Normal heart sounds.  Pulmonary:     Effort: Pulmonary effort is normal.     Breath sounds: Normal breath sounds.  Abdominal:     General: Bowel sounds are normal.     Palpations: Abdomen is soft.  Musculoskeletal:     Cervical back: Normal range of motion.     Comments: B/l +1 edema  Skin:    General: Skin is warm and dry.  Neurological:     Mental Status: She is alert and oriented to person, place, and time.    Breast exam was performed in seated and lying down position. Patient is status post right lumpectomy with a well-healed surgical scar. No evidence of any palpable masses. No evidence of axillary adenopathy. No evidence of any palpable masses or lumps in the left breast. No evidence of leftt axillary adenopathy\  CMP Latest Ref Rng & Units 11/08/2019  Glucose 65 - 99 mg/dL 101(H)  BUN 8 - 27 mg/dL 15  Creatinine 0.57 - 1.00 mg/dL 1.17(H)  Sodium 134 - 144 mmol/L 139  Potassium 3.5 - 5.2 mmol/L 4.4  Chloride 96 - 106 mmol/L 101  CO2 20 - 29 mmol/L 24  Calcium 8.7 - 10.3 mg/dL 9.6  Total Protein 6.0 - 8.5 g/dL -  Total Bilirubin 0.0 - 1.2 mg/dL -  Alkaline Phos 39 - 117 IU/L -  AST 0 - 40 IU/L -  ALT 0 - 32 IU/L -   CBC Latest Ref Rng & Units  11/08/2019  WBC 3.4 - 10.8 x10E3/uL 5.7  Hemoglobin 11.1 - 15.9 g/dL 12.5  Hematocrit 34.0 - 46.6 % 37.3  Platelets 150 - 450 x10E3/uL 312     Assessment and plan- Patient is a 74 y.o. female with left breast ER positive DCIS currently on Arimidex  Patient is tolerating Arimidex well without any significant side effects.  She will continue that for 5 years.  Patient had a bone density scan in November 2020 which was overall stable as compared to the one that she had in June 2018.  I have reviewed her labs from 11/08/2019 which shows her CBC and CMP are unremarkable.  I will see her back in 6 months for a video visit   Visit Diagnosis 1. Ductal carcinoma in situ (DCIS) of left breast   2. Visit for monitoring Arimidex therapy      Dr. Randa Evens, MD, MPH [facility] at [facility] [id number] 11/17/2019 12:18 PM

## 2020-01-09 ENCOUNTER — Other Ambulatory Visit: Payer: Self-pay

## 2020-01-09 ENCOUNTER — Telehealth: Payer: Self-pay | Admitting: Internal Medicine

## 2020-01-09 DIAGNOSIS — K219 Gastro-esophageal reflux disease without esophagitis: Secondary | ICD-10-CM

## 2020-01-09 MED ORDER — OMEPRAZOLE 20 MG PO CPDR: 20.0000 mg | DELAYED_RELEASE_CAPSULE | Freq: Every day | ORAL | 0 refills | Status: DC

## 2020-01-09 NOTE — Telephone Encounter (Signed)
Pt ran out of meds needs 30 day sent in to [facility]   omeprazole (PRILOSEC) 20 MG capsule [id number]

## 2020-03-06 ENCOUNTER — Encounter: Payer: Self-pay | Admitting: Internal Medicine

## 2020-04-11 ENCOUNTER — Ambulatory Visit (INDEPENDENT_AMBULATORY_CARE_PROVIDER_SITE_OTHER): Payer: Medicare Other

## 2020-04-11 DIAGNOSIS — Z0000 Encounter for general adult medical examination without abnormal findings: Secondary | ICD-10-CM

## 2020-04-11 NOTE — Progress Notes (Signed)
Subjective:   Brandy Diaz is a 74 y.o. female who presents for Medicare Annual (Subsequent) preventive examination.  Virtual Visit via Telephone Note  I connected with  Brandy Diaz on 04/11/20 at  2:40 PM EDT by telephone and verified that I am speaking with the correct person using two identifiers.  Medicare Annual Wellness visit completed telephonically due to Covid-19 pandemic.   Location: Patient: home Provider: office   I discussed the limitations, risks, security and privacy concerns of performing an evaluation and management service by telephone and the availability of in person appointments. The patient expressed understanding and agreed to proceed.  Unable to perform video visit due to patient does not have video capability.   Some vital signs may be absent or patient reported.   Clemetine Marker, LPN    Review of Systems:   Cardiac Risk Factors include: advanced age (>6men, >17 women);diabetes mellitus;dyslipidemia;hypertension     Objective:     Vitals: There were no vitals taken for this visit.  There is no height or weight on file to calculate BMI.  Advanced Directives 11/15/2019 04/18/2019 04/11/2019 10/14/2018 04/16/2018 04/07/2018 10/06/2017  Does Patient Have a Medical Advance Directive? No No No No No No No  Would patient like information on creating a medical advance directive? No - Patient declined No - Patient declined No - Patient declined No - Patient declined No - Patient declined Yes (MAU/Ambulatory/Procedural Areas - Information given) No - Patient declined    Tobacco Social History   Tobacco Use  Smoking Status Never Smoker  Smokeless Tobacco Never Used  Tobacco Comment   smoking cessation materials not required     Counseling given: Not Answered Comment: smoking cessation materials not required   Clinical Intake:  Pre-visit preparation completed: Yes  Pain : No/denies pain     Nutritional Risks: None Diabetes: Yes CBG done?:  No Did pt. bring in CBG monitor from home?: No   Nutrition Risk Assessment:  Has the patient had any N/V/D within the last 2 months?  No  Does the patient have any non-healing wounds?  No  Has the patient had any unintentional weight loss or weight gain?  No   Diabetes:  Is the patient diabetic?  Yes  If diabetic, was a CBG obtained today?  No  Did the patient bring in their glucometer from home?  No  How often do you monitor your CBG's? daily.   Financial Strains and Diabetes Management:  Are you having any financial strains with the device, your supplies or your medication? No .  Does the patient want to be seen by Chronic Care Management for management of their diabetes?  No  Would the patient like to be referred to a Nutritionist or for Diabetic Management?  No   Diabetic Exams:  Diabetic Eye Exam:  Overdue for diabetic eye exam. Pt has been advised about the importance in completing this exam.   Diabetic Foot Exam: Completed 11/08/19.   How often do you need to have someone help you when you read instructions, pamphlets, or other written materials from your doctor or pharmacy?: 1 - Never  Interpreter Needed?: No  Information entered by :: Clemetine Marker LPN  Past Medical History:  Diagnosis Date  . Anemia   . Breast cancer ([place]) 2018   Left  . Diabetes mellitus without complication (HCC)    diet controlled  . Edema    ankles  . GERD (gastroesophageal reflux disease)   . Hypertension  Past Surgical History:  Procedure Laterality Date  . ABDOMINAL HYSTERECTOMY  1980  . BREAST BIOPSY Left 2018   DCIS  . 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 BREAST LUMPECTOMY WITH NEEDLE LOCALIZATION Left 04/07/2017   Procedure: BREAST LUMPECTOMY WITH NEEDLE LOCALIZATION;  Surgeon: Vickie Epley, MD;  Location: [facility] ORS;  Service: General;  Laterality: Left;  . BREAST SURGERY Left    Breast Biopsy  .  CATARACT EXTRACTION W/PHACO Right 06/12/2016   Procedure: CATARACT EXTRACTION PHACO AND INTRAOCULAR LENS PLACEMENT (IOC);  Surgeon: Eulogio Bear, MD;  Location: [facility] ORS;  Service: Ophthalmology;  Laterality: Right;  [place] 6.1 AP% 12.7 CDE .77 FLUID PACK LOT # [id number]  . COLONOSCOPY    . EYE SURGERY Right    Cataract Extraction with IOL  . OOPHORECTOMY     Family History  Problem Relation Age of Onset  . Breast cancer Mother 35  . Diabetes Mother   . Hypertension Mother   . CAD Father   . Breast cancer Sister   . Hypertension Sister   . Breast cancer Maternal Aunt 49  . Throat cancer Brother    Social History   Socioeconomic History  . Marital status: Married    Spouse name: Not on file  . Number of children: 0  . Years of education: Not on file  . Highest education level: 10th grade  Occupational History  . Occupation: Retired  Tobacco Use  . Smoking status: Never Smoker  . Smokeless tobacco: Never Used  . Tobacco comment: smoking cessation materials not required  Substance and Sexual Activity  . Alcohol use: No    Alcohol/week: 0.0 standard drinks  . Drug use: No  . Sexual activity: Not Currently  Other Topics Concern  . Not on file  Social History Narrative  . Not on file   Social Determinants of Health   Financial Resource Strain: Low Risk   . Difficulty of Paying Living Expenses: Not very hard  Food Insecurity: No Food Insecurity  . Worried About Charity fundraiser in the Last Year: Never true  . Ran Out of Food in the Last Year: Never true  Transportation Needs: No Transportation Needs  . Lack of Transportation (Medical): No  . Lack of Transportation (Non-Medical): No  Physical Activity: Inactive  . Days of Exercise per Week: 0 days  . Minutes of Exercise per Session: 0 min  Stress: No Stress Concern Present  . Feeling of Stress : Not at all  Social Connections: Slightly Isolated  . Frequency of Communication with Friends and Family: More than  three times a week  . Frequency of Social Gatherings with Friends and Family: More than three times a week  . Attends Religious Services: More than 4 times per year  . Active Member of Clubs or Organizations: No  . Attends Archivist Meetings: Never  . Marital Status: Married    Outpatient Encounter Medications as of 04/11/2020  Medication Sig  . Accu-Chek Softclix Lancets lancets 1 each by Other route 2 (two) times daily. Use as instructed  . anastrozole (ARIMIDEX) 1 MG tablet TAKE 1 TABLET BY MOUTH  DAILY  . aspirin 81 MG chewable tablet Chew by mouth daily.  [name] Kitchen atorvastatin (LIPITOR) 10 MG tablet TAKE 1 TABLET BY MOUTH  DAILY  . calcium carbonate (OSCAL) 1500 (600 Ca) MG TABS tablet Take by mouth 2 (two) times daily with a  meal.  . fluticasone (FLONASE) 50 MCG/ACT nasal spray Place 2 sprays into both nostrils daily.  [name] Kitchen glucose blood (ACCU-CHEK AVIVA PLUS) test strip 1 each by Other route daily at 2 PM. Use as instructed  . irbesartan (AVAPRO) 75 MG tablet Take 1 tablet (75 mg total) by mouth daily.  [name] Kitchen omeprazole (PRILOSEC) 20 MG capsule Take 1 capsule (20 mg total) by mouth daily.  [name] Kitchen triamcinolone cream (KENALOG) 0.1 % Apply 1 application topically 2 (two) times daily. Apply to lesions on both lower legs twice a day  . Multiple Vitamins-Calcium (ONE-A-DAY WOMENS PO) Take 1 tablet by mouth every other day.   No facility-administered encounter medications on file as of 04/11/2020.    Activities of Daily Living In your present state of health, do you have any difficulty performing the following activities: 04/11/2020  Hearing? Y  Comment declines hearing aids  Vision? N  Difficulty concentrating or making decisions? N  Walking or climbing stairs? N  Dressing or bathing? N  Doing errands, shopping? N  Preparing Food and eating ? N  Using the Toilet? N  In the past six months, have you accidently leaked urine? N  Do you have problems with loss of bowel control? N  Managing your  Medications? N  Managing your Finances? N  Housekeeping or managing your Housekeeping? N  Some recent data might be hidden    Patient Care Team: Glean Hess, MD as PCP - General (Internal Medicine) Sindy Guadeloupe, MD as Consulting Physician (Oncology) Eulogio Bear, MD as Consulting Physician (Ophthalmology)    Assessment:   This is a routine wellness examination for Emeri.  Exercise Activities and Dietary recommendations Current Exercise Habits: The patient does not participate in regular exercise at present, Exercise limited by: None identified  Goals    . DIET - INCREASE WATER INTAKE     Recommend to drink at least 6-8 8oz glasses of water per day.       Fall Risk Fall Risk  04/11/2020 04/18/2019 04/11/2019 08/16/2018 04/15/2018  Falls in the past year? 0 0 0 No No  Number falls in past yr: 0 0 0 - -  Injury with Fall? 0 0 0 - -  Risk for fall due to : No Fall Risks - - - -  Risk for fall due to: Comment - - - - -  Follow up Falls prevention discussed Falls evaluation completed Falls prevention discussed - -   FALL RISK PREVENTION PERTAINING TO THE HOME:  Any stairs in or around the home? Yes  If so, are there any without handrails? Yes   Home free of loose throw rugs in walkways, pet beds, electrical cords, etc? Yes  Adequate lighting in your home to reduce risk of falls? Yes   ASSISTIVE DEVICES UTILIZED TO PREVENT FALLS:  Life alert? No  Use of a cane, walker or w/c? No  Grab bars in the bathroom? No  Shower chair or bench in shower? No  Elevated toilet seat or a handicapped toilet? No   DME ORDERS:  DME order needed?  No   TIMED UP AND GO:  Was the test performed? No . Telephonic visit.   Education: Fall risk prevention has been discussed.  Intervention(s) required? No    DME/home health order needed?  No    Depression Screen PHQ 2/9 Scores 04/11/2020 06/01/2019 04/18/2019 04/11/2019  PHQ - 2 Score 0 0 3 2  PHQ- 9 Score - 2 10 9  Cognitive  Function     6CIT Screen 04/11/2020 04/11/2019 04/07/2018 01/19/2017  What Year? 0 points 0 points 0 points 0 points  What month? 0 points 0 points 0 points 0 points  What time? 0 points 0 points 0 points 0 points  Count back from 20 0 points 0 points 0 points 0 points  Months in reverse 0 points 0 points 0 points 2 points  Repeat phrase 4 points 6 points 4 points 0 points  Total Score 4 6 4 2     Immunization History  Administered Date(s) Administered  . Influenza Nasal 08/12/2019  . Influenza Split 08/24/2017  . Influenza, High Dose Seasonal PF 08/18/2017, 08/16/2018, 08/12/2019  . Influenza-Unspecified 09/26/2015, 08/24/2016, 08/24/2017  . Moderna SARS-COVID-2 Vaccination 02/20/2020, 03/21/2020  . Pneumococcal Conjugate-13 12/27/2014  . Pneumococcal Polysaccharide-23 11/11/2012, 05/05/2013, 05/05/2013  . Tdap 01/20/2013, 05/11/2013    Qualifies for Shingles Vaccine? No . Due for Shingrix. Education has been provided regarding the importance of this vaccine. Pt has been advised to call insurance company to determine out of pocket expense. Advised may also receive vaccine at local pharmacy or Health Dept. Verbalized acceptance and understanding.  Tdap: Up to date  Flu Vaccine: Up to date  Pneumococcal Vaccine: Up to date  Covid-19 Vaccine: Due for Covid-19 vaccine. Education has been provided regarding the importance of this vaccine. Patient advised may receive at local pharmacy, Health Dept or [facility]. Aware to provide a copy of the vaccination record once obtained. Verbalized acceptance and understanding.    Screening Tests Health Maintenance  Topic Date Due  . OPHTHALMOLOGY EXAM  04/01/2017  . Hepatitis C Screening  12/30/2020 (Originally 21-Jul-1946)  . HEMOGLOBIN A1C  05/08/2020  . INFLUENZA VACCINE  06/10/2020  . MAMMOGRAM  09/25/2020  . FOOT EXAM  11/07/2020  . COLONOSCOPY  10/26/2022  . TETANUS/TDAP  05/12/2023  . DEXA SCAN  Completed  . COVID-19 Vaccine  Completed   . PNA vac Low Risk Adult  Completed    Cancer Screenings:  Colorectal Screening: Completed 10/26/12. Repeat every 10 years;   Mammogram: Completed 09/26/19. Repeat every year.   Bone Density: Completed 09/26/19. Results reflect  OSTEOPENIA. Repeat every 2 years.   Lung Cancer Screening: (Low Dose CT Chest recommended if Age 36-80 years, 30 pack-year currently smoking OR have quit w/in 15years.) does not qualify.   Additional Screening:  Hepatitis C Screening: does qualify; postponed  Vision Screening: Recommended annual ophthalmology exams for early detection of glaucoma and other disorders of the eye. Is the patient up to date with their annual eye exam?  No  Who is the provider or what is the name of the office in which the pt attends annual eye exams? [place] Screening: Recommended annual dental exams for proper oral hygiene  Community Resource Referral:  CRR required this visit?  No       Plan:     I have personally reviewed and addressed the Medicare Annual Wellness questionnaire and have noted the following in the patient's chart:  A. Medical and social history B. Use of alcohol, tobacco or illicit drugs  C. Current medications and supplements D. Functional ability and status E.  Nutritional status F.  Physical activity G. Advance directives H. List of other physicians I.  Hospitalizations, surgeries, and ER visits in previous 12 months J.  [place] such as hearing and vision if needed, cognitive and depression L. Referrals and appointments   In addition, I have reviewed  and discussed with patient certain preventive protocols, quality metrics, and best practice recommendations. A written personalized care plan for preventive services as well as general preventive health recommendations were provided to patient.   Signed,  Clemetine Marker, LPN Nurse Health Advisor   Nurse Notes: pt c/o decreased appetite over the past month and  feels like she is having more noticeable and louder bowel sounds. Pt has upcoming appt with Dr. Army Melia on 04/19/20

## 2020-04-11 NOTE — Patient Instructions (Signed)
Ms. Brandy Diaz , Thank you for taking time to come for your Medicare Wellness Visit. I appreciate your ongoing commitment to your health goals. Please review the following plan we discussed and let me know if I can assist you in the future.   Screening recommendations/referrals: Colonoscopy: done 10/26/12 Mammogram: done 09/26/19 Bone Density: done 09/26/19 Recommended yearly ophthalmology/optometry visit for glaucoma screening and checkup Recommended yearly dental visit for hygiene and checkup  Vaccinations: Influenza vaccine: done 08/12/19 Pneumococcal vaccine: done 12/27/14 Tdap vaccine: done 05/11/13 Shingles vaccine: Shingrix discussed. Please contact your pharmacy for coverage information.  Covid-19: done 02/20/20 & 03/21/20  Advanced directives: Advance directive discussed with you today. Even though you declined this today please call our office should you change your mind and we can give you the proper paperwork for you to fill out.  Conditions/risks identified: Recommend increasing physical activity  Next appointment: Follow up in one year for your annual wellness visit    Preventive Care 65 Years and Older, Female Preventive care refers to lifestyle choices and visits with your health care provider that can promote health and wellness. What does preventive care include?  A yearly physical exam. This is also called an annual well check.  Dental exams once or twice a year.  Routine eye exams. Ask your health care provider how often you should have your eyes checked.  Personal lifestyle choices, including:  Daily care of your teeth and gums.  Regular physical activity.  Eating a healthy diet.  Avoiding tobacco and drug use.  Limiting alcohol use.  Practicing safe sex.  Taking low-dose aspirin every day.  Taking vitamin and mineral supplements as recommended by your health care provider. What happens during an annual well check? The services and screenings done by  your health care provider during your annual well check will depend on your age, overall health, lifestyle risk factors, and family history of disease. Counseling  Your health care provider may ask you questions about your:  Alcohol use.  Tobacco use.  Drug use.  Emotional well-being.  Home and relationship well-being.  Sexual activity.  Eating habits.  History of falls.  Memory and ability to understand (cognition).  Work and work Statistician.  Reproductive health. Screening  You may have the following tests or measurements:  Height, weight, and BMI.  Blood pressure.  Lipid and cholesterol levels. These may be checked every 5 years, or more frequently if you are over 89 years old.  Skin check.  Lung cancer screening. You may have this screening every year starting at age 57 if you have a 30-pack-year history of smoking and currently smoke or have quit within the past 15 years.  Fecal occult blood test (FOBT) of the stool. You may have this test every year starting at age 61.  Flexible sigmoidoscopy or colonoscopy. You may have a sigmoidoscopy every 5 years or a colonoscopy every 10 years starting at age 64.  Hepatitis C blood test.  Hepatitis B blood test.  Sexually transmitted disease (STD) testing.  Diabetes screening. This is done by checking your blood sugar (glucose) after you have not eaten for a while (fasting). You may have this done every 1-3 years.  Bone density scan. This is done to screen for osteoporosis. You may have this done starting at age 63.  Mammogram. This may be done every 1-2 years. Talk to your health care provider about how often you should have regular mammograms. Talk with your health care provider about your test results, treatment  options, and if necessary, the need for more tests. Vaccines  Your health care provider may recommend certain vaccines, such as:  Influenza vaccine. This is recommended every year.  Tetanus,  diphtheria, and acellular pertussis (Tdap, Td) vaccine. You may need a Td booster every 10 years.  Zoster vaccine. You may need this after age 62.  Pneumococcal 13-valent conjugate (PCV13) vaccine. One dose is recommended after age 2.  Pneumococcal polysaccharide (PPSV23) vaccine. One dose is recommended after age 31. Talk to your health care provider about which screenings and vaccines you need and how often you need them. This information is not intended to replace advice given to you by your health care provider. Make sure you discuss any questions you have with your health care provider. Document Released: 11/23/2015 Document Revised: 07/16/2016 Document Reviewed: 08/28/2015 Elsevier Interactive Patient Education  2017 [place] Prevention in the Home Falls can cause injuries. They can happen to people of all ages. There are many things you can do to make your home safe and to help prevent falls. What can I do on the outside of my home?  Regularly fix the edges of walkways and driveways and fix any cracks.  Remove anything that might make you trip as you walk through a door, such as a raised step or threshold.  Trim any bushes or trees on the path to your home.  Use bright outdoor lighting.  Clear any walking paths of anything that might make someone trip, such as rocks or tools.  Regularly check to see if handrails are loose or broken. Make sure that both sides of any steps have handrails.  Any raised decks and porches should have guardrails on the edges.  Have any leaves, snow, or ice cleared regularly.  Use sand or salt on walking paths during winter.  Clean up any spills in your garage right away. This includes oil or grease spills. What can I do in the bathroom?  Use night lights.  Install grab bars by the toilet and in the tub and shower. Do not use towel bars as grab bars.  Use non-skid mats or decals in the tub or shower.  If you need to sit down in  the shower, use a plastic, non-slip stool.  Keep the floor dry. Clean up any water that spills on the floor as soon as it happens.  Remove soap buildup in the tub or shower regularly.  Attach bath mats securely with double-sided non-slip rug tape.  Do not have throw rugs and other things on the floor that can make you trip. What can I do in the bedroom?  Use night lights.  Make sure that you have a light by your bed that is easy to reach.  Do not use any sheets or blankets that are too big for your bed. They should not hang down onto the floor.  Have a firm chair that has side arms. You can use this for support while you get dressed.  Do not have throw rugs and other things on the floor that can make you trip. What can I do in the kitchen?  Clean up any spills right away.  Avoid walking on wet floors.  Keep items that you use a lot in easy-to-reach places.  If you need to reach something above you, use a strong step stool that has a grab bar.  Keep electrical cords out of the way.  Do not use floor polish or wax that makes floors slippery.  If you must use wax, use non-skid floor wax.  Do not have throw rugs and other things on the floor that can make you trip. What can I do with my stairs?  Do not leave any items on the stairs.  Make sure that there are handrails on both sides of the stairs and use them. Fix handrails that are broken or loose. Make sure that handrails are as long as the stairways.  Check any carpeting to make sure that it is firmly attached to the stairs. Fix any carpet that is loose or worn.  Avoid having throw rugs at the top or bottom of the stairs. If you do have throw rugs, attach them to the floor with carpet tape.  Make sure that you have a light switch at the top of the stairs and the bottom of the stairs. If you do not have them, ask someone to add them for you. What else can I do to help prevent falls?  Wear shoes that:  Do not have high  heels.  Have rubber bottoms.  Are comfortable and fit you well.  Are closed at the toe. Do not wear sandals.  If you use a stepladder:  Make sure that it is fully opened. Do not climb a closed stepladder.  Make sure that both sides of the stepladder are locked into place.  Ask someone to hold it for you, if possible.  Clearly mark and make sure that you can see:  Any grab bars or handrails.  First and last steps.  Where the edge of each step is.  Use tools that help you move around (mobility aids) if they are needed. These include:  Canes.  Walkers.  Scooters.  Crutches.  Turn on the lights when you go into a dark area. Replace any light bulbs as soon as they burn out.  Set up your furniture so you have a clear path. Avoid moving your furniture around.  If any of your floors are uneven, fix them.  If there are any pets around you, be aware of where they are.  Review your medicines with your doctor. Some medicines can make you feel dizzy. This can increase your chance of falling. Ask your doctor what other things that you can do to help prevent falls. This information is not intended to replace advice given to you by your health care provider. Make sure you discuss any questions you have with your health care provider. Document Released: 08/23/2009 Document Revised: 04/03/2016 Document Reviewed: 12/01/2014 Elsevier Interactive Patient Education  2017 [employer].

## 2020-04-19 ENCOUNTER — Other Ambulatory Visit: Payer: Self-pay

## 2020-04-19 ENCOUNTER — Encounter: Payer: Self-pay | Admitting: Internal Medicine

## 2020-04-19 ENCOUNTER — Ambulatory Visit (INDEPENDENT_AMBULATORY_CARE_PROVIDER_SITE_OTHER): Payer: Medicare Other | Admitting: Internal Medicine

## 2020-04-19 VITALS — BP 138/84 | HR 78 | Temp 98.1°F | Ht 66.0 in | Wt 133.0 lb

## 2020-04-19 DIAGNOSIS — E782 Mixed hyperlipidemia: Secondary | ICD-10-CM

## 2020-04-19 DIAGNOSIS — I10 Essential (primary) hypertension: Secondary | ICD-10-CM

## 2020-04-19 DIAGNOSIS — N1831 Chronic kidney disease, stage 3a: Secondary | ICD-10-CM | POA: Diagnosis not present

## 2020-04-19 DIAGNOSIS — R7303 Prediabetes: Secondary | ICD-10-CM

## 2020-04-19 DIAGNOSIS — R634 Abnormal weight loss: Secondary | ICD-10-CM

## 2020-04-19 DIAGNOSIS — Z0000 Encounter for general adult medical examination without abnormal findings: Secondary | ICD-10-CM | POA: Diagnosis not present

## 2020-04-19 LAB — POCT URINALYSIS DIPSTICK
Bilirubin, UA: NEGATIVE
Blood, UA: NEGATIVE
Glucose, UA: NEGATIVE
Ketones, UA: NEGATIVE
Leukocytes, UA: NEGATIVE
Nitrite, UA: NEGATIVE
Protein, UA: NEGATIVE
Spec Grav, UA: 1.01 (ref 1.010–1.025)
Urobilinogen, UA: 0.2 U/dL
pH, UA: 6 (ref 5.0–8.0)

## 2020-04-19 NOTE — Patient Instructions (Addendum)
Continue Ensure daily  Resume Geritol supplements  Stop Omeprazole 20 mg

## 2020-04-19 NOTE — Progress Notes (Signed)
Date:  04/19/2020   Name:  Brandy Diaz   DOB:  [birth year]   MRN:  [id number]   Chief Complaint: Annual Exam (no breast exam/ no pap) and GI Problem ("loud growl" X2 weeks slowed down, no pain, hunrgy doesnt have appetite to eat ) NANCY ARVIN is a 74 y.o. female who presents today for her Complete Annual Exam. She feels fairly well. She reports exercising working in the garden. She reports she is sleeping fairly well. She denies breast complaints.  Mammogram  09/2019 DEXA  09/2019 osteopenia Colonoscopy 10/2012 Immunization History  Administered Date(s) Administered  . Influenza Nasal 08/12/2019  . Influenza Split 08/24/2017  . Influenza, High Dose Seasonal PF 08/18/2017, 08/16/2018, 08/12/2019  . Influenza-Unspecified 09/26/2015, 08/24/2016, 08/24/2017  . Moderna SARS-COVID-2 Vaccination 02/20/2020, 03/21/2020  . Pneumococcal Conjugate-13 12/27/2014  . Pneumococcal Polysaccharide-23 11/11/2012, 05/05/2013, 05/05/2013  . Tdap 01/20/2013, 05/11/2013    Hypertension This is a chronic problem. The problem is controlled. Pertinent negatives include no chest pain, headaches, palpitations or shortness of breath. Past treatments include angiotensin blockers. The current treatment provides significant improvement.  Diabetes She presents for her follow-up diabetic visit. Diabetes type: prediabetes. Her disease course has been stable. Pertinent negatives for hypoglycemia include no dizziness, headaches, nervousness/anxiousness or tremors. Associated symptoms include weight loss. Pertinent negatives for diabetes include no chest pain, no fatigue, no polydipsia and no polyuria. Current diabetic treatment includes diet. An ACE inhibitor/angiotensin II receptor blocker is being taken. Eye exam is current.  Gastroesophageal Reflux She complains of early satiety. She reports no abdominal pain, no chest pain, no coughing, no globus sensation, no heartburn, no nausea, no water brash or no  wheezing. The problem occurs rarely. The problem has been resolved. Associated symptoms include weight loss. Pertinent negatives include no anemia or fatigue. She has tried a PPI for the symptoms. The treatment provided significant relief. Past procedures include an EGD. but report not available from 2013 - requested.  Hyperlipidemia The problem is controlled. Pertinent negatives include no chest pain or shortness of breath. Current antihyperlipidemic treatment includes statins. The current treatment provides significant improvement of lipids.    Lab Results  Component Value Date   CREATININE 1.17 (H) 11/08/2019   BUN 15 11/08/2019   NA 139 11/08/2019   K 4.4 11/08/2019   CL 101 11/08/2019   CO2 24 11/08/2019   Lab Results  Component Value Date   CHOL 176 04/18/2019   HDL 78 04/18/2019   LDLCALC 85 04/18/2019   TRIG 67 04/18/2019   CHOLHDL 2.3 04/18/2019   Lab Results  Component Value Date   TSH 1.210 04/18/2019   Lab Results  Component Value Date   HGBA1C 6.0 (H) 11/08/2019   Lab Results  Component Value Date   WBC 5.7 11/08/2019   HGB 12.5 11/08/2019   HCT 37.3 11/08/2019   MCV 87 11/08/2019   PLT 312 11/08/2019   Lab Results  Component Value Date   ALT 13 04/18/2019   AST 20 04/18/2019   ALKPHOS 98 04/18/2019   BILITOT 0.4 04/18/2019     Review of Systems  Constitutional: Positive for appetite change, unexpected weight change and weight loss. Negative for chills, fatigue and fever.  HENT: Negative for congestion, hearing loss, trouble swallowing and voice change.   Eyes: Negative for visual disturbance.  Respiratory: Negative for cough, chest tightness, shortness of breath and wheezing.   Cardiovascular: Negative for chest pain, palpitations and leg swelling.  Gastrointestinal: Negative for abdominal distention,  abdominal pain, constipation, diarrhea, heartburn, nausea and vomiting.  Endocrine: Negative for polydipsia and polyuria.  Genitourinary: Negative  for dysuria, frequency, genital sores, vaginal bleeding and vaginal discharge.  Musculoskeletal: Negative for arthralgias, gait problem and joint swelling.  Skin: Negative for color change and rash.  Neurological: Negative for dizziness, tremors, light-headedness and headaches.  Hematological: Negative for adenopathy. Does not bruise/bleed easily.  Psychiatric/Behavioral: Negative for dysphoric mood and sleep disturbance. The patient is not nervous/anxious.     Patient Active Problem List   Diagnosis Date Noted  . Venous stasis dermatitis of both lower extremities 11/08/2019  . Ductal carcinoma in situ (DCIS) of left breast 05/21/2017  . Localized edema 04/28/2017  . Stage 3a chronic kidney disease 01/01/2016  . Essential (primary) hypertension 07/24/2015  . Gastro-esophageal reflux disease without esophagitis 07/24/2015  . Mixed hyperlipidemia 07/24/2015  . Prediabetes 07/24/2015  . Fibrocystic breast changes 07/24/2015  . Anxiety 05/24/2012    Allergies  Allergen Reactions  . Ace Inhibitors Other (See Comments)    Cough  . Hydrochlorothiazide Other (See Comments)    Dizziness  . Triamterene-Hctz Other (See Comments)    Appetite loss    Past Surgical History:  Procedure Laterality Date  . ABDOMINAL HYSTERECTOMY  1980  . BREAST BIOPSY Left 2018   DCIS  . 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 BREAST LUMPECTOMY WITH NEEDLE LOCALIZATION Left 04/07/2017   Procedure: BREAST LUMPECTOMY WITH NEEDLE LOCALIZATION;  Surgeon: Vickie Epley, MD;  Location: [facility] ORS;  Service: General;  Laterality: Left;  . BREAST SURGERY Left    Breast Biopsy  . CATARACT EXTRACTION W/PHACO Right 06/12/2016   Procedure: CATARACT EXTRACTION PHACO AND INTRAOCULAR LENS PLACEMENT (IOC);  Surgeon: Eulogio Bear, MD;  Location: [facility] ORS;  Service: Ophthalmology;  Laterality: Right;  [place] 6.1 AP% 12.7 CDE .77 FLUID PACK LOT # [id number]    . COLONOSCOPY    . EYE SURGERY Right    Cataract Extraction with IOL  . OOPHORECTOMY      Social History   Tobacco Use  . Smoking status: Never Smoker  . Smokeless tobacco: Never Used  . Tobacco comment: smoking cessation materials not required  Vaping Use  . Vaping Use: Never used  Substance Use Topics  . Alcohol use: No    Alcohol/week: 0.0 standard drinks  . Drug use: No     Medication list has been reviewed and updated.  Current Meds  Medication Sig  . Accu-Chek Softclix Lancets lancets 1 each by Other route 2 (two) times daily. Use as instructed  . anastrozole (ARIMIDEX) 1 MG tablet TAKE 1 TABLET BY MOUTH  DAILY  . aspirin 81 MG chewable tablet Chew by mouth daily.  [name] Kitchen atorvastatin (LIPITOR) 10 MG tablet TAKE 1 TABLET BY MOUTH  DAILY  . calcium carbonate (OSCAL) 1500 (600 Ca) MG TABS tablet Take by mouth 2 (two) times daily with a meal.  . fluticasone (FLONASE) 50 MCG/ACT nasal spray Place 2 sprays into both nostrils daily.  [name] Kitchen glucose blood (ACCU-CHEK AVIVA PLUS) test strip 1 each by Other route daily at 2 PM. Use as instructed  . irbesartan (AVAPRO) 75 MG tablet Take 1 tablet (75 mg total) by mouth daily.  [name] Kitchen omeprazole (PRILOSEC) 20 MG capsule Take 1 capsule (20 mg total) by mouth daily.  [name] Kitchen triamcinolone cream (KENALOG) 0.1 % Apply 1 application topically 2 (two) times daily. Apply to lesions on both lower  legs twice a day    PHQ 2/9 Scores 04/19/2020 04/11/2020 06/01/2019 04/18/2019  PHQ - 2 Score 0 0 0 3  PHQ- 9 Score 2 - 2 10    BP Readings from Last 3 Encounters:  04/19/20 138/84  11/15/19 (!) 176/82  11/08/19 136/78    Physical Exam Vitals and nursing note reviewed.  Constitutional:      General: She is not in acute distress.    Appearance: She is well-developed.  HENT:     Head: Normocephalic and atraumatic.     Right Ear: Tympanic membrane and ear canal normal.     Left Ear: Tympanic membrane and ear canal normal.     Nose:     Right Sinus: No  maxillary sinus tenderness.     Left Sinus: No maxillary sinus tenderness.  Eyes:     General: No scleral icterus.       Right eye: No discharge.        Left eye: No discharge.     Conjunctiva/sclera: Conjunctivae normal.  Neck:     Thyroid: No thyromegaly.     Vascular: No carotid bruit.  Cardiovascular:     Rate and Rhythm: Normal rate and regular rhythm.     Pulses: Normal pulses.     Heart sounds: Normal heart sounds.  Pulmonary:     Effort: Pulmonary effort is normal. No respiratory distress.     Breath sounds: No wheezing.  Chest:     Breasts:        Right: No tenderness.   Abdominal:     General: Bowel sounds are normal.     Palpations: Abdomen is soft.     Tenderness: There is no abdominal tenderness.  Musculoskeletal:        General: Normal range of motion.     Cervical back: Normal range of motion. No erythema.  Lymphadenopathy:     Cervical: No cervical adenopathy.  Skin:    General: Skin is warm and dry.     Findings: No rash.  Neurological:     Mental Status: She is alert and oriented to person, place, and time.     Cranial Nerves: No cranial nerve deficit.     Sensory: No sensory deficit.     Deep Tendon Reflexes: Reflexes are normal and symmetric.  Psychiatric:        Speech: Speech normal.        Behavior: Behavior normal.        Thought Content: Thought content normal.     Wt Readings from Last 3 Encounters:  04/19/20 133 lb (60.3 kg)  11/15/19 140 lb 11.2 oz (63.8 kg)  11/08/19 139 lb 6.4 oz (63.2 kg)    BP 138/84   Pulse 78   Temp 98.1 F (36.7 C) (Oral)   Ht 5\' 6"  (1.676 m)   Wt 133 lb (60.3 kg)   SpO2 97%   BMI 21.47 kg/m   Assessment and Plan: 1. Annual physical exam Normal exam except for weight changes - POCT urinalysis dipstick  2. Essential (primary) hypertension Clinically stable exam with well controlled BP on. Tolerating medications without side effects at this time. Pt to continue current regimen and low sodium diet;  benefits of regular exercise as able discussed. - CBC with Differential/Platelet - TSH  3. Prediabetes A1C has been consistently below 6.5 for many years so the diagnosis is not DM type 2 Will continue to monitor - Hemoglobin A1c  4. Mixed hyperlipidemia On appropriate high  dose statin without side effects - Lipid panel  5. Stage 3a chronic kidney disease Check labs and advise - Comprehensive metabolic panel  6. Weight loss, unintentional Continue nutritional supplements Resume Geritol vitamins  Stop PPI as this may be causing abd sx and decreased appetite Follow up one month   Partially dictated using [place]. Any errors are unintentional.  Halina Maidens, MD [facility]  04/19/2020

## 2020-04-20 LAB — COMPREHENSIVE METABOLIC PANEL
ALT: 15 IU/L (ref 0–32)
AST: 20 IU/L (ref 0–40)
Albumin/Globulin Ratio: 1.2 (ref 1.2–2.2)
Albumin: 4.3 g/dL (ref 3.7–4.7)
Alkaline Phosphatase: 90 IU/L (ref 48–121)
BUN/Creatinine Ratio: 12 (ref 12–28)
BUN: 15 mg/dL (ref 8–27)
Bilirubin Total: 0.5 mg/dL (ref 0.0–1.2)
CO2: 25 mmol/L (ref 20–29)
Calcium: 10 mg/dL (ref 8.7–10.3)
Chloride: 98 mmol/L (ref 96–106)
Creatinine, Ser: 1.22 mg/dL — ABNORMAL HIGH (ref 0.57–1.00)
GFR calc Af Amer: 50 mL/min/{1.73_m2} — ABNORMAL LOW (ref >59)
GFR calc non Af Amer: 43 mL/min/{1.73_m2} — ABNORMAL LOW (ref >59)
Globulin, Total: 3.7 g/dL (ref 1.5–4.5)
Glucose: 121 mg/dL — ABNORMAL HIGH (ref 65–99)
Potassium: 4.3 mmol/L (ref 3.5–5.2)
Sodium: 139 mmol/L (ref 134–144)
Total Protein: 8 g/dL (ref 6.0–8.5)

## 2020-04-20 LAB — TSH: TSH: 1.2 u[IU]/mL (ref 0.450–4.500)

## 2020-04-20 LAB — CBC WITH DIFFERENTIAL/PLATELET
Basophils Absolute: 0.1 10*3/uL (ref 0.0–0.2)
Basos: 2 %
EOS (ABSOLUTE): 0.2 10*3/uL (ref 0.0–0.4)
Eos: 5 %
Hematocrit: 38.6 % (ref 34.0–46.6)
Hemoglobin: 12.3 g/dL (ref 11.1–15.9)
Immature Grans (Abs): 0 10*3/uL (ref 0.0–0.1)
Immature Granulocytes: 0 %
Lymphocytes Absolute: 0.8 10*3/uL (ref 0.7–3.1)
Lymphs: 18 %
MCH: 28.9 pg (ref 26.6–33.0)
MCHC: 31.9 g/dL (ref 31.5–35.7)
MCV: 91 fL (ref 79–97)
Monocytes Absolute: 0.4 10*3/uL (ref 0.1–0.9)
Monocytes: 8 %
Neutrophils Absolute: 3 10*3/uL (ref 1.4–7.0)
Neutrophils: 67 %
Platelets: 289 10*3/uL (ref 150–450)
RBC: 4.26 x10E6/uL (ref 3.77–5.28)
RDW: 13 % (ref 11.7–15.4)
WBC: 4.5 10*3/uL (ref 3.4–10.8)

## 2020-04-20 LAB — LIPID PANEL
Chol/HDL Ratio: 2.1 ratio (ref 0.0–4.4)
Cholesterol, Total: 168 mg/dL (ref 100–199)
HDL: 80 mg/dL (ref >39)
LDL Chol Calc (NIH): 76 mg/dL (ref 0–99)
Triglycerides: 61 mg/dL (ref 0–149)
VLDL Cholesterol Cal: 12 mg/dL (ref 5–40)

## 2020-04-20 LAB — HEMOGLOBIN A1C
Est. average glucose Bld gHb Est-mCnc: 126 mg/dL
Hgb A1c MFr Bld: 6 % — ABNORMAL HIGH (ref 4.8–5.6)

## 2020-04-20 NOTE — Progress Notes (Signed)
Called pt with normal labs. Good prediabetes. Kidney function slightly decreased but not a concern at this time. Continue the same medications. Pt verbalized understanding.  KP

## 2020-04-21 ENCOUNTER — Encounter: Payer: Self-pay | Admitting: Internal Medicine

## 2020-04-22 ENCOUNTER — Other Ambulatory Visit: Payer: Self-pay | Admitting: Internal Medicine

## 2020-04-22 DIAGNOSIS — N183 Chronic kidney disease, stage 3 unspecified: Secondary | ICD-10-CM

## 2020-04-22 NOTE — Telephone Encounter (Signed)
Requested Prescriptions  Pending Prescriptions Disp Refills   ACCU-CHEK AVIVA PLUS test strip [Pharmacy Med Name: T [facility] Setting ACCU [place] 200 strip 3    Sig: TEST TWICE DAILY     Endocrinology: Diabetes - Testing Supplies Passed - 04/22/2020  5:23 AM      Passed - Valid encounter within last 12 months    Recent Outpatient Visits          3 days ago Annual physical exam   [facility] Glean Hess, MD   5 months ago Essential (primary) hypertension   [facility] Glean Hess, MD   10 months ago Essential (primary) hypertension   [facility] Glean Hess, MD   1 year ago Essential (primary) hypertension   [facility] Glean Hess, MD   1 year ago Type 2 diabetes mellitus with stage 3 chronic kidney disease, without long-term current use of insulin [facility])   [facility] Glean Hess, MD      Future Appointments            In 3 weeks Sindy Guadeloupe, MD [facility]   In 4 weeks Glean Hess, MD [facility] [place]   In 4 months Army Melia Jesse Sans, MD [facility]

## 2020-05-18 ENCOUNTER — Inpatient Hospital Stay: Payer: Medicare Other | Attending: Oncology | Admitting: Oncology

## 2020-05-18 ENCOUNTER — Encounter: Payer: Self-pay | Admitting: Oncology

## 2020-05-18 ENCOUNTER — Other Ambulatory Visit: Payer: Self-pay

## 2020-05-18 VITALS — BP 136/74 | HR 95 | Temp 97.9°F | Resp 18 | Wt 133.5 lb

## 2020-05-18 DIAGNOSIS — Z79899 Other long term (current) drug therapy: Secondary | ICD-10-CM | POA: Diagnosis not present

## 2020-05-18 DIAGNOSIS — Z803 Family history of malignant neoplasm of breast: Secondary | ICD-10-CM | POA: Diagnosis not present

## 2020-05-18 DIAGNOSIS — R634 Abnormal weight loss: Secondary | ICD-10-CM | POA: Diagnosis not present

## 2020-05-18 DIAGNOSIS — R630 Anorexia: Secondary | ICD-10-CM | POA: Diagnosis not present

## 2020-05-18 DIAGNOSIS — D0512 Intraductal carcinoma in situ of left breast: Secondary | ICD-10-CM

## 2020-05-18 DIAGNOSIS — I10 Essential (primary) hypertension: Secondary | ICD-10-CM | POA: Insufficient documentation

## 2020-05-18 DIAGNOSIS — Z5181 Encounter for therapeutic drug level monitoring: Secondary | ICD-10-CM | POA: Diagnosis not present

## 2020-05-18 DIAGNOSIS — Z833 Family history of diabetes mellitus: Secondary | ICD-10-CM | POA: Diagnosis not present

## 2020-05-18 DIAGNOSIS — Z79811 Long term (current) use of aromatase inhibitors: Secondary | ICD-10-CM | POA: Diagnosis not present

## 2020-05-18 DIAGNOSIS — Z8249 Family history of ischemic heart disease and other diseases of the circulatory system: Secondary | ICD-10-CM | POA: Diagnosis not present

## 2020-05-18 DIAGNOSIS — Z08 Encounter for follow-up examination after completed treatment for malignant neoplasm: Secondary | ICD-10-CM

## 2020-05-18 DIAGNOSIS — Z170 Estrogen receptor positive status [ER+]: Secondary | ICD-10-CM | POA: Diagnosis not present

## 2020-05-18 DIAGNOSIS — Z808 Family history of malignant neoplasm of other organs or systems: Secondary | ICD-10-CM | POA: Insufficient documentation

## 2020-05-18 DIAGNOSIS — Z90721 Acquired absence of ovaries, unilateral: Secondary | ICD-10-CM | POA: Diagnosis not present

## 2020-05-18 DIAGNOSIS — Z853 Personal history of malignant neoplasm of breast: Secondary | ICD-10-CM

## 2020-05-18 DIAGNOSIS — K219 Gastro-esophageal reflux disease without esophagitis: Secondary | ICD-10-CM | POA: Diagnosis not present

## 2020-05-18 DIAGNOSIS — Z8719 Personal history of other diseases of the digestive system: Secondary | ICD-10-CM | POA: Diagnosis not present

## 2020-05-18 NOTE — Progress Notes (Signed)
Hematology/Oncology Consult note [facility]  [phone] [phone]  Patient Care Team: Glean Hess, MD as PCP - General (Internal Medicine) Sindy Guadeloupe, MD as Consulting Physician (Oncology) Eulogio Bear, MD as Consulting Physician (Ophthalmology)   Name of the patient: Brandy Diaz  [id number]  04/16/1946   Date of visit: 05/18/20  Diagnosis- left breast ER+ DCIS   Chief complaint/ Reason for visit-routine follow-up of ER positive DCIS on Arimidex  Heme/Onc history: Patient is a 74 year old female with a past medical history significant for hypertension and diet-controlled diabetes who underwent screening mammogram which showed an abnormal mass in the left breast 3 cm from the nipple 1 x 0.9 x 0.3 cm in size and mobile mass at 4:00 position 0.8 x 0.7 x 0.5 cm in size. Evaluation of axilla was negative for adenopathy. This was followed by ultrasound and the mass at the 5:30 position was stable long-term and consistent with benign lesion and core biopsy of the 4:00 position mass was recommended.  2. 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   3. 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 No necrosis was identified. Margins were negative for DCIS 8 mm from the deep margin. ER 5190% positive PR negative  4. Bone density scan in June 2018 showed T score of -2.2 consistent with osteopenia. 10 year probability of a major osteoporotic fracture was 6% andhipfracture was 1.5%. Patient started on arimidex in June 2018   Interval history-patient reports having no significant appetite in the last 3 months.  She has lost about 10 pounds in that timeframe.  Denies any cough shortness of breath, constipation or diarrhea.  Denies any pain anywhere.  ECOG PS- 1 Pain scale-  0   Review of systems- Review of Systems  Constitutional: Positive for malaise/fatigue and weight loss. Negative for chills and fever.       Lack of appetite  HENT: Negative for congestion, ear discharge and nosebleeds.   Eyes: Negative for blurred vision.  Respiratory: Negative for cough, hemoptysis, sputum production, shortness of breath and wheezing.   Cardiovascular: Negative for chest pain, palpitations, orthopnea and claudication.  Gastrointestinal: Negative for abdominal pain, blood in stool, constipation, diarrhea, heartburn, melena, nausea and vomiting.  Genitourinary: Negative for dysuria, flank pain, frequency, hematuria and urgency.  Musculoskeletal: Negative for back pain, joint pain and myalgias.  Skin: Negative for rash.  Neurological: Negative for dizziness, tingling, focal weakness, seizures, weakness and headaches.  Endo/Heme/Allergies: Does not bruise/bleed easily.  Psychiatric/Behavioral: Negative for depression and suicidal ideas. The patient does not have insomnia.      Allergies  Allergen Reactions  . Ace Inhibitors Other (See Comments)    Cough  . Hydrochlorothiazide Other (See Comments)    Dizziness  . Triamterene-Hctz Other (See Comments)    Appetite loss     Past Medical History:  Diagnosis Date  . Anemia   . Breast cancer ([place]) 2018   Left  . Diabetes mellitus without complication (HCC)    diet controlled  . Edema    ankles  . GERD (gastroesophageal reflux disease)   . Hypertension   . Unexplained weight loss 07/24/2015   EGD and Colonoscopy normal 2014      Past Surgical History:  Procedure Laterality Date  . ABDOMINAL HYSTERECTOMY  1980  . BREAST BIOPSY Left 2018  DCIS  . 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 BREAST LUMPECTOMY WITH NEEDLE LOCALIZATION Left 04/07/2017   Procedure: BREAST LUMPECTOMY WITH NEEDLE LOCALIZATION;  Surgeon: Vickie Epley, MD;  Location:  [facility] ORS;  Service: General;  Laterality: Left;  . BREAST SURGERY Left    Breast Biopsy  . CATARACT EXTRACTION W/PHACO Right 06/12/2016   Procedure: CATARACT EXTRACTION PHACO AND INTRAOCULAR LENS PLACEMENT (IOC);  Surgeon: Eulogio Bear, MD;  Location: [facility] ORS;  Service: Ophthalmology;  Laterality: Right;  [place] 6.1 AP% 12.7 CDE .77 FLUID PACK LOT # [id number]  . COLONOSCOPY  08/02/2012   Dr. Donnella Sham - diverticuli otherwise normal  . ESOPHAGOGASTRODUODENOSCOPY  08/02/2012   chronic gastritis  . EYE SURGERY Right    Cataract Extraction with IOL  . OOPHORECTOMY      Social History   Socioeconomic History  . Marital status: Married    Spouse name: Not on file  . Number of children: 0  . Years of education: Not on file  . Highest education level: 10th grade  Occupational History  . Occupation: Retired  Tobacco Use  . Smoking status: Never Smoker  . Smokeless tobacco: Never Used  . Tobacco comment: smoking cessation materials not required  Vaping Use  . Vaping Use: Never used  Substance and Sexual Activity  . Alcohol use: No    Alcohol/week: 0.0 standard drinks  . Drug use: No  . Sexual activity: Not Currently  Other Topics Concern  . Not on file  Social History Narrative  . Not on file   Social Determinants of Health   Financial Resource Strain: Low Risk   . Difficulty of Paying Living Expenses: Not very hard  Food Insecurity: No Food Insecurity  . Worried About Charity fundraiser in the Last Year: Never true  . Ran Out of Food in the Last Year: Never true  Transportation Needs: No Transportation Needs  . Lack of Transportation (Medical): No  . Lack of Transportation (Non-Medical): No  Physical Activity: Inactive  . Days of Exercise per Week: 0 days  . Minutes of Exercise per Session: 0 min  Stress: No Stress Concern Present  . Feeling of Stress : Not at all  Social Connections: Moderately Integrated  . Frequency of Communication with Friends and Family: More  than three times a week  . Frequency of Social Gatherings with Friends and Family: More than three times a week  . Attends Religious Services: More than 4 times per year  . Active Member of Clubs or Organizations: No  . Attends Archivist Meetings: Never  . Marital Status: Married  Human resources officer Violence: Not At Risk  . Fear of Current or Ex-Partner: No  . Emotionally Abused: No  . Physically Abused: No  . Sexually Abused: No    Family History  Problem Relation Age of Onset  . Breast cancer Mother 8  . Diabetes Mother   . Hypertension Mother   . CAD Father   . Breast cancer Sister   . Hypertension Sister   . Breast cancer Maternal Aunt 46  . Throat cancer Brother      Current Outpatient Medications:  .  ACCU-CHEK AVIVA PLUS test strip, TEST TWICE DAILY, Disp: 200 strip, Rfl: 3 .  Accu-Chek Softclix Lancets lancets, 1 each by Other route 2 (two) times daily. Use as instructed, Disp: 100 each, Rfl: 12 .  anastrozole (ARIMIDEX) 1 MG  tablet, TAKE 1 TABLET BY MOUTH  DAILY, Disp: 90 tablet, Rfl: 3 .  aspirin 81 MG chewable tablet, Chew by mouth daily., Disp: , Rfl:  .  atorvastatin (LIPITOR) 10 MG tablet, TAKE 1 TABLET BY MOUTH  DAILY, Disp: 90 tablet, Rfl: 3 .  calcium carbonate (OSCAL) 1500 (600 Ca) MG TABS tablet, Take by mouth 2 (two) times daily with a meal., Disp: , Rfl:  .  fluticasone (FLONASE) 50 MCG/ACT nasal spray, Place 2 sprays into both nostrils daily., Disp: 48 g, Rfl: 3 .  irbesartan (AVAPRO) 75 MG tablet, Take 1 tablet (75 mg total) by mouth daily., Disp: 90 tablet, Rfl: 3 .  omeprazole (PRILOSEC) 20 MG capsule, Take 1 capsule (20 mg total) by mouth daily., Disp: 30 capsule, Rfl: 0 .  triamcinolone cream (KENALOG) 0.1 %, Apply 1 application topically 2 (two) times daily. Apply to lesions on both lower legs twice a day, Disp: 30 g, Rfl: 5  Physical exam:  Vitals:   05/18/20 1105  BP: 136/74  Pulse: 95  Resp: 18  Temp: 97.9 F (36.6 C)  TempSrc:  Tympanic  SpO2: 100%  Weight: 133 lb 8 oz (60.6 kg)   Physical Exam Constitutional:      General: She is not in acute distress. Cardiovascular:     Rate and Rhythm: Normal rate and regular rhythm.     Heart sounds: Normal heart sounds.  Pulmonary:     Effort: Pulmonary effort is normal.     Breath sounds: Normal breath sounds.  Abdominal:     General: Bowel sounds are normal.     Palpations: Abdomen is soft.  Musculoskeletal:     Comments: Trace bilateral edema  Skin:    General: Skin is warm and dry.  Neurological:     Mental Status: She is alert and oriented to person, place, and time.    Breast exam was performed in seated and lying down position. Patient is status post left lumpectomy with a well-healed surgical scar. No evidence of any palpable masses. No evidence of axillary adenopathy. No evidence of any palpable masses or lumps in the right breast. No evidence of right axillary adenopathy    CMP Latest Ref Rng & Units 04/19/2020  Glucose 65 - 99 mg/dL 121(H)  BUN 8 - 27 mg/dL 15  Creatinine 0.57 - 1.00 mg/dL 1.22(H)  Sodium 134 - 144 mmol/L 139  Potassium 3.5 - 5.2 mmol/L 4.3  Chloride 96 - 106 mmol/L 98  CO2 20 - 29 mmol/L 25  Calcium 8.7 - 10.3 mg/dL 10.0  Total Protein 6.0 - 8.5 g/dL 8.0  Total Bilirubin 0.0 - 1.2 mg/dL 0.5  Alkaline Phos 48 - 121 IU/L 90  AST 0 - 40 IU/L 20  ALT 0 - 32 IU/L 15   CBC Latest Ref Rng & Units 04/19/2020  WBC 3.4 - 10.8 x10E3/uL 4.5  Hemoglobin 11.1 - 15.9 g/dL 12.3  Hematocrit 34.0 - 46.6 % 38.6  Platelets 150 - 450 x10E3/uL 289    Assessment and plan- Patient is a 74 y.o. female with left breast ER positive DCIS on Arimidex here for routine follow-up  Patient is tolerating Arimidex along with calcium and vitamin D well without any significant side effects.  She started this in 2018 and will continue until 2023.  She will be due for a mammogram in November 2021 which we will order.  No abnormal findings on breast exam  today.  Patient reports loss of appetite and significant weight loss  over the last 3 months.  Her weight was 133 pounds today down from 140 pounds in January 2021.  She is continuing to take Ensure supplements as well.  It would be unusual for DCIS to present as metastatic disease.  I will reassess her in 3 months and if her weight loss is ongoing I will consider getting systemic scans at that time   Visit Diagnosis 1. Encounter for follow-up surveillance of breast cancer   2. Ductal carcinoma in situ (DCIS) of left breast   3. Visit for monitoring Arimidex therapy   4. Abnormal weight loss      Dr. Randa Evens, MD, MPH [facility] [phone] 05/18/2020 1:13 PM

## 2020-05-21 ENCOUNTER — Ambulatory Visit (INDEPENDENT_AMBULATORY_CARE_PROVIDER_SITE_OTHER): Payer: Medicare Other | Admitting: Internal Medicine

## 2020-05-21 ENCOUNTER — Encounter: Payer: Self-pay | Admitting: Internal Medicine

## 2020-05-21 ENCOUNTER — Other Ambulatory Visit: Payer: Self-pay

## 2020-05-21 ENCOUNTER — Telehealth: Payer: Self-pay | Admitting: Internal Medicine

## 2020-05-21 VITALS — BP 122/68 | HR 98 | Temp 98.4°F | Ht 66.0 in | Wt 133.0 lb

## 2020-05-21 DIAGNOSIS — K219 Gastro-esophageal reflux disease without esophagitis: Secondary | ICD-10-CM | POA: Diagnosis not present

## 2020-05-21 DIAGNOSIS — I10 Essential (primary) hypertension: Secondary | ICD-10-CM

## 2020-05-21 DIAGNOSIS — D485 Neoplasm of uncertain behavior of skin: Secondary | ICD-10-CM | POA: Diagnosis not present

## 2020-05-21 MED ORDER — AMOXICILLIN 875 MG PO TABS: 875.0000 mg | ORAL_TABLET | Freq: Two times a day (BID) | ORAL | 0 refills | Status: AC

## 2020-05-21 MED ORDER — MUPIROCIN CALCIUM 2 % EX CREA: 1.0000 "application " | TOPICAL_CREAM | Freq: Two times a day (BID) | CUTANEOUS | 0 refills | Status: DC

## 2020-05-21 NOTE — Telephone Encounter (Signed)
Pt informed and said she understands verbally.   CM

## 2020-05-21 NOTE — Progress Notes (Signed)
Date:  05/21/2020   Name:  Brandy Diaz   DOB:  [birth year]   MRN:  [id number]   Chief Complaint: Weight Loss, Abdominal Pain (X 1 month follow up. Doing better off the protonix. Has no complaints of GERD. ), and Recurrent Skin Infections (On left side of chin. X 2 months ago. Started as a regular bump and grew bigger. It keeps coming back. Stings but doesn't itch.)  Gastroesophageal Reflux She reports no abdominal pain, no chest pain, no dysphagia, no heartburn or no wheezing. sx are resolved. Pertinent negatives include no fatigue. She has tried a PPI (stopped last visit due to weight loss and abdominal pain) for the symptoms.  skin lesion  -  Started about 3 months ago as a pimple which she squeezed and removed a small amount of pus. Since then it continues to enlarge, it is slightly painful and itchy.  Minimal drainage. Weight loss - noted to have moderate wt loss and change in appetite last visit.  She started using Geritol which has helped her energy and appetite.  Also stopped her PPI due to vague abdominal discomfort.  She is feeling well, weight has not decreased, no recurrence of gerd.  Lab Results  Component Value Date   CREATININE 1.22 (H) 04/19/2020   BUN 15 04/19/2020   NA 139 04/19/2020   K 4.3 04/19/2020   CL 98 04/19/2020   CO2 25 04/19/2020   Lab Results  Component Value Date   CHOL 168 04/19/2020   HDL 80 04/19/2020   LDLCALC 76 04/19/2020   TRIG 61 04/19/2020   CHOLHDL 2.1 04/19/2020   Lab Results  Component Value Date   TSH 1.200 04/19/2020   Lab Results  Component Value Date   HGBA1C 6.0 (H) 04/19/2020   Lab Results  Component Value Date   WBC 4.5 04/19/2020   HGB 12.3 04/19/2020   HCT 38.6 04/19/2020   MCV 91 04/19/2020   PLT 289 04/19/2020   Lab Results  Component Value Date   ALT 15 04/19/2020   AST 20 04/19/2020   ALKPHOS 90 04/19/2020   BILITOT 0.5 04/19/2020     Review of Systems  Constitutional: Negative for chills,  diaphoresis, fatigue and unexpected weight change.  Respiratory: Negative for chest tightness and wheezing.   Cardiovascular: Negative for chest pain, palpitations and leg swelling.  Gastrointestinal: Negative for abdominal pain, dysphagia and heartburn.  Skin: Positive for wound.  Neurological: Negative for dizziness and headaches.    Patient Active Problem List   Diagnosis Date Noted   Venous stasis dermatitis of both lower extremities 11/08/2019   Ductal carcinoma in situ (DCIS) of left breast 05/21/2017   Localized edema 04/28/2017   Stage 3a chronic kidney disease 01/01/2016   Essential (primary) hypertension 07/24/2015   Gastro-esophageal reflux disease without esophagitis 07/24/2015   Mixed hyperlipidemia 07/24/2015   Prediabetes 07/24/2015   Fibrocystic breast changes 07/24/2015   Anxiety 05/24/2012    Allergies  Allergen Reactions   Ace Inhibitors Other (See Comments)    Cough   Hydrochlorothiazide Other (See Comments)    Dizziness   Triamterene-Hctz Other (See Comments)    Appetite loss    Past Surgical History:  Procedure Laterality Date   ABDOMINAL HYSTERECTOMY  1980   BREAST BIOPSY Left 2018   DCIS   BREAST LUMPECTOMY Left 03/2017   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 WITH    BREAST LUMPECTOMY WITH NEEDLE LOCALIZATION Left 04/07/2017  Procedure: BREAST LUMPECTOMY WITH NEEDLE LOCALIZATION;  Surgeon: Vickie Epley, MD;  Location: [facility] ORS;  Service: General;  Laterality: Left;   BREAST SURGERY Left    Breast Biopsy   CATARACT EXTRACTION W/PHACO Right 06/12/2016   Procedure: CATARACT EXTRACTION PHACO AND INTRAOCULAR LENS PLACEMENT ([place]);  Surgeon: Eulogio Bear, MD;  Location: [facility] ORS;  Service: Ophthalmology;  Laterality: Right;  [place] 6.1 AP% 12.7 CDE .77 FLUID PACK LOT # [id number]   COLONOSCOPY  08/02/2012   Dr. Donnella Sham - diverticuli otherwise normal   ESOPHAGOGASTRODUODENOSCOPY  08/02/2012   chronic  gastritis   EYE SURGERY Right    Cataract Extraction with IOL   OOPHORECTOMY      Social History   Tobacco Use   Smoking status: Never Smoker   Smokeless tobacco: Never Used   Tobacco comment: smoking cessation materials not required  Vaping Use   Vaping Use: Never used  Substance Use Topics   Alcohol use: No    Alcohol/week: 0.0 standard drinks   Drug use: No     Medication list has been reviewed and updated.  Current Meds  Medication Sig   ACCU-CHEK AVIVA PLUS test strip TEST TWICE DAILY   Accu-Chek Softclix Lancets lancets 1 each by Other route 2 (two) times daily. Use as instructed   anastrozole (ARIMIDEX) 1 MG tablet TAKE 1 TABLET BY MOUTH  DAILY   aspirin 81 MG chewable tablet Chew by mouth daily.   atorvastatin (LIPITOR) 10 MG tablet TAKE 1 TABLET BY MOUTH  DAILY   calcium carbonate (OSCAL) 1500 (600 Ca) MG TABS tablet Take by mouth 2 (two) times daily with a meal.   fluticasone (FLONASE) 50 MCG/ACT nasal spray Place 2 sprays into both nostrils daily.   irbesartan (AVAPRO) 75 MG tablet Take 1 tablet (75 mg total) by mouth daily.   triamcinolone cream (KENALOG) 0.1 % Apply 1 application topically 2 (two) times daily. Apply to lesions on both lower legs twice a day    PHQ 2/9 Scores 05/21/2020 04/19/2020 04/11/2020 06/01/2019  PHQ - 2 Score 0 0 0 0  PHQ- 9 Score 0 2 - 2    GAD 7 : Generalized Anxiety Score 05/21/2020 04/19/2020  Nervous, Anxious, on Edge 0 0  Control/stop worrying 0 0  Worry too much - different things 0 0  Trouble relaxing 0 0  Restless 0 0  Easily annoyed or irritable 0 0  Afraid - awful might happen 0 0  Total GAD 7 Score 0 0  Anxiety Difficulty - Not difficult at all    BP Readings from Last 3 Encounters:  05/21/20 122/68  05/18/20 136/74  04/19/20 138/84    Physical Exam Vitals and nursing note reviewed.  Constitutional:      General: She is not in acute distress.    Appearance: She is well-developed.  HENT:      Head: Normocephalic and atraumatic.  Cardiovascular:     Rate and Rhythm: Normal rate and regular rhythm.  Pulmonary:     Effort: Pulmonary effort is normal. No respiratory distress.     Breath sounds: No wheezing or rhonchi.  Abdominal:     General: Abdomen is flat. There is no distension.     Palpations: Abdomen is soft.     Tenderness: There is no abdominal tenderness. There is no guarding or rebound.  Musculoskeletal:     Cervical back: Normal range of motion.  Lymphadenopathy:     Cervical: No cervical adenopathy.  Skin:  General: Skin is warm and dry.     Findings: Lesion present. No rash.     Comments: At angle of jaw on left:  1 cm crusting, raised hypertrophic lesion with several raised erosions; no fluctuance or drainage  Neurological:     General: No focal deficit present.     Mental Status: She is alert and oriented to person, place, and time.  Psychiatric:        Behavior: Behavior normal.        Thought Content: Thought content normal.     Wt Readings from Last 3 Encounters:  05/21/20 133 lb (60.3 kg)  05/18/20 133 lb 8 oz (60.6 kg)  04/19/20 133 lb (60.3 kg)    BP 122/68 (BP Location: Right Arm, Patient Position: Sitting, Cuff Size: Normal)    Pulse 98    Temp 98.4 F (36.9 C) (Oral)    Ht 5\' 6"  (1.676 m)    Wt 133 lb (60.3 kg)    SpO2 98%    BMI 21.47 kg/m   Assessment and Plan: 1. Neoplasm of uncertain behavior of skin of face Will treat for bacterial infection and re-evaluate in 2 weeks May need Dermatology evaluation - amoxicillin (AMOXIL) 875 MG tablet; Take 1 tablet (875 mg total) by mouth 2 (two) times daily for 10 days.  Dispense: 20 tablet; Refill: 0 - mupirocin cream (BACTROBAN) 2 %; Apply 1 application topically 2 (two) times daily. To lesion on face  Dispense: 15 g; Refill: 0  2. Gastro-esophageal reflux disease without esophagitis Sx are resolved now off of PPI Will continue to monitor  3. Essential (primary) hypertension Clinically  stable exam with well controlled BP. Tolerating medications without side effects at this time. Pt to continue current regimen and low sodium diet; benefits of regular exercise as able discussed.   Partially dictated using Editor, commissioning. Any errors are unintentional.  Halina Maidens, MD [facility]  05/21/2020

## 2020-05-21 NOTE — Telephone Encounter (Signed)
Call pt and tell her to use triple antibiotic ointment from over the counter instead of the cream that is not available.

## 2020-05-21 NOTE — Telephone Encounter (Signed)
Pharmacy called to inform the nurse Baxter Flattery that they do not have the medication mupirocin cream (BACTROBAN) 2 %, and would like an alternative sent to the pharmacy.  Please call to discuss at [phone]

## 2020-05-25 ENCOUNTER — Other Ambulatory Visit: Payer: Self-pay | Admitting: Internal Medicine

## 2020-05-25 DIAGNOSIS — I10 Essential (primary) hypertension: Secondary | ICD-10-CM

## 2020-06-04 ENCOUNTER — Other Ambulatory Visit: Payer: Self-pay

## 2020-06-04 ENCOUNTER — Ambulatory Visit (INDEPENDENT_AMBULATORY_CARE_PROVIDER_SITE_OTHER): Payer: Medicare Other | Admitting: Internal Medicine

## 2020-06-04 ENCOUNTER — Encounter: Payer: Self-pay | Admitting: Internal Medicine

## 2020-06-04 VITALS — BP 132/78 | HR 93 | Temp 98.6°F | Ht 66.0 in | Wt 134.0 lb

## 2020-06-04 DIAGNOSIS — D485 Neoplasm of uncertain behavior of skin: Secondary | ICD-10-CM

## 2020-06-04 DIAGNOSIS — I872 Venous insufficiency (chronic) (peripheral): Secondary | ICD-10-CM

## 2020-06-04 DIAGNOSIS — I10 Essential (primary) hypertension: Secondary | ICD-10-CM | POA: Diagnosis not present

## 2020-06-04 NOTE — Progress Notes (Signed)
Date:  06/04/2020   Name:  Brandy Diaz   DOB:  February 01, 1946   MRN:  [id number]   Chief Complaint: skin lesions (X2-3 month, both legs and on left side, was painful, itching, but not at this time, swollen , has applied both creams to legs and left side of face,getting better)  Rash The problem has been rapidly improving since onset. The affected locations include the face. Rash characteristics: started as a pimple then became much larger and was infected last visit with concern for neoplasm. Pertinent negatives include no diarrhea, fatigue, fever or shortness of breath. Past treatments include antibiotic cream and antibiotics. The treatment provided significant relief.   Stasis dermatitis - now treating regularly with TAC cream and improved.  Lab Results  Component Value Date   CREATININE 1.22 (H) 04/19/2020   BUN 15 04/19/2020   NA 139 04/19/2020   K 4.3 04/19/2020   CL 98 04/19/2020   CO2 25 04/19/2020   Lab Results  Component Value Date   CHOL 168 04/19/2020   HDL 80 04/19/2020   LDLCALC 76 04/19/2020   TRIG 61 04/19/2020   CHOLHDL 2.1 04/19/2020   Lab Results  Component Value Date   TSH 1.200 04/19/2020   Lab Results  Component Value Date   HGBA1C 6.0 (H) 04/19/2020   Lab Results  Component Value Date   WBC 4.5 04/19/2020   HGB 12.3 04/19/2020   HCT 38.6 04/19/2020   MCV 91 04/19/2020   PLT 289 04/19/2020   Lab Results  Component Value Date   ALT 15 04/19/2020   AST 20 04/19/2020   ALKPHOS 90 04/19/2020   BILITOT 0.5 04/19/2020     Review of Systems  Constitutional: Negative for chills, fatigue and fever.  Respiratory: Negative for chest tightness and shortness of breath.   Cardiovascular: Negative for chest pain.  Gastrointestinal: Negative for abdominal distention, abdominal pain, constipation and diarrhea.  Skin: Positive for rash (improved) and wound (almost resolved).    Patient Active Problem List   Diagnosis Date Noted  . Venous stasis  dermatitis of both lower extremities 11/08/2019  . Ductal carcinoma in situ (DCIS) of left breast 05/21/2017  . Localized edema 04/28/2017  . Stage 3a chronic kidney disease 01/01/2016  . Essential (primary) hypertension 07/24/2015  . Gastro-esophageal reflux disease without esophagitis 07/24/2015  . Mixed hyperlipidemia 07/24/2015  . Prediabetes 07/24/2015  . Fibrocystic breast changes 07/24/2015  . Anxiety 05/24/2012    Allergies  Allergen Reactions  . Ace Inhibitors Other (See Comments)    Cough  . Hydrochlorothiazide Other (See Comments)    Dizziness  . Triamterene-Hctz Other (See Comments)    Appetite loss    Past Surgical History:  Procedure Laterality Date  . ABDOMINAL HYSTERECTOMY  1980  . BREAST BIOPSY Left 2018   DCIS  . 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 BREAST LUMPECTOMY WITH NEEDLE LOCALIZATION Left 04/07/2017   Procedure: BREAST LUMPECTOMY WITH NEEDLE LOCALIZATION;  Surgeon: Vickie Epley, MD;  Location: [facility] OR[facility];  Service: General;  Laterality: Left;  . BREAST SURGERY Left    Breast Biopsy  . CATARACT EXTRACTION W/PHACO Right 06/12/2016   Procedure: CATARACT EXTRACTION PHACO AND INTRAOCULAR LENS PLACEMENT ([facility]);  Surgeon: Eulogio Bear, MD;  Location: [facility] OR[facility];  Service: Ophthalmology;  Laterality: Right;  [place] 6.1 AP% 12.7 CDE .77 FLUID PACK LOT # [id number]  . COLONOSCOPY  08/02/2012  Dr. Donnella Sham - diverticuli otherwise normal  . ESOPHAGOGASTRODUODENOSCOPY  08/02/2012   chronic gastritis  . EYE SURGERY Right    Cataract Extraction with IOL  . OOPHORECTOMY      Social History   Tobacco Use  . Smoking status: Never Smoker  . Smokeless tobacco: Never Used  . Tobacco comment: smoking cessation materials not required  Vaping Use  . Vaping Use: Never used  Substance Use Topics  . Alcohol use: No    Alcohol/week: 0.0 standard drinks  . Drug use: No     Medication list has  been reviewed and updated.  Current Meds  Medication Sig  . ACCU-CHEK AVIVA PLUS test strip TEST TWICE DAILY  . Accu-Chek Softclix Lancets lancets 1 each by Other route 2 (two) times daily. Use as instructed  . aspirin 81 MG chewable tablet Chew by mouth daily.  [name] Kitchen atorvastatin (LIPITOR) 10 MG tablet TAKE 1 TABLET BY MOUTH  DAILY  . calcium carbonate (OSCAL) 1500 (600 Ca) MG TABS tablet Take by mouth 2 (two) times daily with a meal.  . fluticasone (FLONASE) 50 MCG/ACT nasal spray Place 2 sprays into both nostrils daily.  . irbesartan (AVAPRO) 75 MG tablet TAKE 1 TABLET BY MOUTH  DAILY  . mupirocin cream (BACTROBAN) 2 % Apply 1 application topically 2 (two) times daily. To lesion on face  . triamcinolone cream (KENALOG) 0.1 % Apply 1 application topically 2 (two) times daily. Apply to lesions on both lower legs twice a day    PHQ 2/9 Scores 06/04/2020 05/21/2020 04/19/2020 04/11/2020  PHQ - 2 Score 0 0 0 0  PHQ- 9 Score 0 0 2 -    GAD 7 : Generalized Anxiety Score 06/04/2020 05/21/2020 04/19/2020  Nervous, Anxious, on Edge 0 0 0  Control/stop worrying 0 0 0  Worry too much - different things 0 0 0  Trouble relaxing 0 0 0  Restless 0 0 0  Easily annoyed or irritable 1 0 0  Afraid - awful might happen 0 0 0  Total GAD 7 Score 1 0 0  Anxiety Difficulty Not difficult at all - Not difficult at all    BP Readings from Last 3 Encounters:  06/04/20 (!) 132/78  05/21/20 122/68  05/18/20 136/74    Physical Exam Neck:   Cardiovascular:     Rate and Rhythm: Normal rate and regular rhythm.     Pulses: Normal pulses.  Pulmonary:     Effort: Pulmonary effort is normal.     Breath sounds: Normal breath sounds.  Musculoskeletal:     Cervical back: Normal range of motion and neck supple.     Right lower leg: No edema.     Left lower leg: No edema.  Lymphadenopathy:     Cervical: No cervical adenopathy.  Skin:    General: Skin is warm.     Comments: Stasis dermatitis changes both lower legs  - skin changes improved     Wt Readings from Last 3 Encounters:  06/04/20 134 lb (60.8 kg)  05/21/20 133 lb (60.3 kg)  05/18/20 133 lb 8 oz (60.6 kg)    BP (!) 132/78   Pulse 93   Temp 98.6 F (37 C) (Oral)   Ht 5\' 6"  (1.676 m)   Wt 134 lb (60.8 kg)   SpO2 97%   BMI 21.63 kg/m   Assessment and Plan: 1. Venous stasis dermatitis of both lower extremities Continue daily TAC topically  2. Essential (primary) hypertension Clinically stable exam with  well controlled BP. Tolerating medications without side effects at this time. Pt to continue current regimen and low sodium diet; benefits of regular exercise as able discussed.  3. Neoplasm of uncertain behavior of skin of face Area now healing well - c/w facial abscess only   Partially dictated using Editor, commissioning. Any errors are unintentional.  Halina Maidens, MD [facility]  06/04/2020

## 2020-06-29 ENCOUNTER — Other Ambulatory Visit: Payer: Self-pay | Admitting: Oncology

## 2020-08-16 ENCOUNTER — Other Ambulatory Visit: Payer: Self-pay | Admitting: Internal Medicine

## 2020-08-16 DIAGNOSIS — E782 Mixed hyperlipidemia: Secondary | ICD-10-CM

## 2020-08-17 ENCOUNTER — Inpatient Hospital Stay: Payer: Medicare Other | Attending: Oncology

## 2020-08-17 ENCOUNTER — Encounter: Payer: Self-pay | Admitting: Oncology

## 2020-08-17 ENCOUNTER — Inpatient Hospital Stay (HOSPITAL_BASED_OUTPATIENT_CLINIC_OR_DEPARTMENT_OTHER): Payer: Medicare Other | Admitting: Oncology

## 2020-08-17 ENCOUNTER — Other Ambulatory Visit: Payer: Self-pay

## 2020-08-17 VITALS — BP 130/65 | HR 86 | Temp 96.9°F | Resp 18 | Wt 135.3 lb

## 2020-08-17 DIAGNOSIS — Z79899 Other long term (current) drug therapy: Secondary | ICD-10-CM | POA: Insufficient documentation

## 2020-08-17 DIAGNOSIS — Z79811 Long term (current) use of aromatase inhibitors: Secondary | ICD-10-CM | POA: Insufficient documentation

## 2020-08-17 DIAGNOSIS — Z90721 Acquired absence of ovaries, unilateral: Secondary | ICD-10-CM | POA: Insufficient documentation

## 2020-08-17 DIAGNOSIS — Z833 Family history of diabetes mellitus: Secondary | ICD-10-CM | POA: Diagnosis not present

## 2020-08-17 DIAGNOSIS — Z8249 Family history of ischemic heart disease and other diseases of the circulatory system: Secondary | ICD-10-CM | POA: Diagnosis not present

## 2020-08-17 DIAGNOSIS — Z803 Family history of malignant neoplasm of breast: Secondary | ICD-10-CM | POA: Diagnosis not present

## 2020-08-17 DIAGNOSIS — R600 Localized edema: Secondary | ICD-10-CM | POA: Insufficient documentation

## 2020-08-17 DIAGNOSIS — Z170 Estrogen receptor positive status [ER+]: Secondary | ICD-10-CM | POA: Insufficient documentation

## 2020-08-17 DIAGNOSIS — K219 Gastro-esophageal reflux disease without esophagitis: Secondary | ICD-10-CM | POA: Insufficient documentation

## 2020-08-17 DIAGNOSIS — D0512 Intraductal carcinoma in situ of left breast: Secondary | ICD-10-CM | POA: Insufficient documentation

## 2020-08-17 DIAGNOSIS — E119 Type 2 diabetes mellitus without complications: Secondary | ICD-10-CM | POA: Diagnosis not present

## 2020-08-17 DIAGNOSIS — Z801 Family history of malignant neoplasm of trachea, bronchus and lung: Secondary | ICD-10-CM | POA: Diagnosis not present

## 2020-08-17 DIAGNOSIS — R5383 Other fatigue: Secondary | ICD-10-CM | POA: Insufficient documentation

## 2020-08-17 DIAGNOSIS — M8580 Other specified disorders of bone density and structure, unspecified site: Secondary | ICD-10-CM | POA: Insufficient documentation

## 2020-08-17 DIAGNOSIS — I10 Essential (primary) hypertension: Secondary | ICD-10-CM | POA: Diagnosis not present

## 2020-08-17 DIAGNOSIS — Z5181 Encounter for therapeutic drug level monitoring: Secondary | ICD-10-CM

## 2020-08-17 LAB — CBC WITH DIFFERENTIAL/PLATELET
Abs Immature Granulocytes: 0.02 10*3/uL (ref 0.00–0.07)
Basophils Absolute: 0.1 10*3/uL (ref 0.0–0.1)
Basophils Relative: 1 %
Eosinophils Absolute: 0.2 10*3/uL (ref 0.0–0.5)
Eosinophils Relative: 4 %
HCT: 34.4 % — ABNORMAL LOW (ref 36.0–46.0)
Hemoglobin: 11.9 g/dL — ABNORMAL LOW (ref 12.0–15.0)
Immature Granulocytes: 0 %
Lymphocytes Relative: 16 %
Lymphs Abs: 0.9 10*3/uL (ref 0.7–4.0)
MCH: 30.4 pg (ref 26.0–34.0)
MCHC: 34.6 g/dL (ref 30.0–36.0)
MCV: 88 fL (ref 80.0–100.0)
Monocytes Absolute: 0.6 10*3/uL (ref 0.1–1.0)
Monocytes Relative: 11 %
Neutro Abs: 3.7 10*3/uL (ref 1.7–7.7)
Neutrophils Relative %: 68 %
Platelets: 319 10*3/uL (ref 150–400)
RBC: 3.91 MIL/uL (ref 3.87–5.11)
RDW: 13.8 % (ref 11.5–15.5)
WBC: 5.5 10*3/uL (ref 4.0–10.5)
nRBC: 0 % (ref 0.0–0.2)

## 2020-08-17 LAB — COMPREHENSIVE METABOLIC PANEL
ALT: 12 U/L (ref 0–44)
AST: 21 U/L (ref 15–41)
Albumin: 3.7 g/dL (ref 3.5–5.0)
Alkaline Phosphatase: 64 U/L (ref 38–126)
Anion gap: 8 (ref 5–15)
BUN: 17 mg/dL (ref 8–23)
CO2: 28 mmol/L (ref 22–32)
Calcium: 9.2 mg/dL (ref 8.9–10.3)
Chloride: 102 mmol/L (ref 98–111)
Creatinine, Ser: 1.17 mg/dL — ABNORMAL HIGH (ref 0.44–1.00)
GFR calc non Af Amer: 46 mL/min — ABNORMAL LOW (ref >60)
Glucose, Bld: 64 mg/dL — ABNORMAL LOW (ref 70–99)
Potassium: 4.1 mmol/L (ref 3.5–5.1)
Sodium: 138 mmol/L (ref 135–145)
Total Bilirubin: 0.5 mg/dL (ref 0.3–1.2)
Total Protein: 7.5 g/dL (ref 6.5–8.1)

## 2020-08-21 ENCOUNTER — Other Ambulatory Visit: Payer: Self-pay

## 2020-08-21 ENCOUNTER — Ambulatory Visit (INDEPENDENT_AMBULATORY_CARE_PROVIDER_SITE_OTHER): Payer: Medicare Other | Admitting: Internal Medicine

## 2020-08-21 ENCOUNTER — Encounter: Payer: Self-pay | Admitting: Internal Medicine

## 2020-08-21 VITALS — BP 130/78 | HR 72 | Temp 98.2°F | Ht 66.0 in | Wt 134.0 lb

## 2020-08-21 DIAGNOSIS — Z170 Estrogen receptor positive status [ER+]: Secondary | ICD-10-CM

## 2020-08-21 DIAGNOSIS — C50912 Malignant neoplasm of unspecified site of left female breast: Secondary | ICD-10-CM | POA: Diagnosis not present

## 2020-08-21 DIAGNOSIS — R7303 Prediabetes: Secondary | ICD-10-CM | POA: Diagnosis not present

## 2020-08-21 DIAGNOSIS — N1831 Chronic kidney disease, stage 3a: Secondary | ICD-10-CM

## 2020-08-21 DIAGNOSIS — F39 Unspecified mood [affective] disorder: Secondary | ICD-10-CM | POA: Insufficient documentation

## 2020-08-21 DIAGNOSIS — Z23 Encounter for immunization: Secondary | ICD-10-CM | POA: Diagnosis not present

## 2020-08-21 DIAGNOSIS — I10 Essential (primary) hypertension: Secondary | ICD-10-CM | POA: Diagnosis not present

## 2020-08-21 DIAGNOSIS — N183 Chronic kidney disease, stage 3 unspecified: Secondary | ICD-10-CM | POA: Insufficient documentation

## 2020-08-21 HISTORY — DX: Unspecified mood (affective) disorder: F39

## 2020-08-21 NOTE — Progress Notes (Signed)
Date:  08/21/2020   Name:  Brandy Diaz   DOB:  April 23, 1946   MRN:  [id number]   Chief Complaint: Prediabetes (follow up - last reading 103 yesterday ) and Flu Vaccine  Diabetes She presents for her follow-up diabetic visit. Diabetes type: prediabetes. Her disease course has been stable. Pertinent negatives for hypoglycemia include no headaches or tremors. Pertinent negatives for diabetes include no chest pain, no fatigue, no polydipsia and no polyuria. Her weight is stable. She monitors blood glucose at home 1-2 x per week. Her breakfast blood glucose is taken between 6-7 am. Her breakfast blood glucose range is generally 90-110 mg/dl. An ACE inhibitor/angiotensin II receptor blocker is being taken.  Hypertension This is a chronic problem. The problem is controlled (at home 118/74). Pertinent negatives include no chest pain, headaches, palpitations or shortness of breath. Past treatments include angiotensin blockers. The current treatment provides significant improvement.  Breast cancer - doing well with no complaints.  Tolerating AI therapy without significant side effects.  Recent labs by oncology reviewed. CKD - most recent GFR stable; no change in mild ankle edema.  Continue to avoid nsaids and maintain hydration.  Lab Results  Component Value Date   CREATININE 1.17 (H) 08/17/2020   BUN 17 08/17/2020   NA 138 08/17/2020   K 4.1 08/17/2020   CL 102 08/17/2020   CO2 28 08/17/2020   Lab Results  Component Value Date   CHOL 168 04/19/2020   HDL 80 04/19/2020   LDLCALC 76 04/19/2020   TRIG 61 04/19/2020   CHOLHDL 2.1 04/19/2020   Lab Results  Component Value Date   TSH 1.200 04/19/2020   Lab Results  Component Value Date   HGBA1C 6.0 (H) 04/19/2020   Lab Results  Component Value Date   WBC 5.5 08/17/2020   HGB 11.9 (L) 08/17/2020   HCT 34.4 (L) 08/17/2020   MCV 88.0 08/17/2020   PLT 319 08/17/2020   Lab Results  Component Value Date   ALT 12 08/17/2020   AST  21 08/17/2020   ALKPHOS 64 08/17/2020   BILITOT 0.5 08/17/2020     Review of Systems  Constitutional: Negative for appetite change, fatigue, fever and unexpected weight change.  HENT: Negative for tinnitus and trouble swallowing.   Eyes: Negative for visual disturbance.  Respiratory: Negative for cough, chest tightness and shortness of breath.   Cardiovascular: Negative for chest pain, palpitations and leg swelling.  Gastrointestinal: Negative for abdominal pain.  Endocrine: Negative for polydipsia and polyuria.  Genitourinary: Negative for dysuria and hematuria.  Musculoskeletal: Negative for arthralgias.  Neurological: Negative for tremors, numbness and headaches.  Psychiatric/Behavioral: Negative for dysphoric mood.    Patient Active Problem List   Diagnosis Date Noted  . Venous stasis dermatitis of both lower extremities 11/08/2019  . Ductal carcinoma in situ (DCIS) of left breast 05/21/2017  . Localized edema 04/28/2017  . Stage 3a chronic kidney disease ([place]) 01/01/2016  . Essential (primary) hypertension 07/24/2015  . Gastro-esophageal reflux disease without esophagitis 07/24/2015  . Mixed hyperlipidemia 07/24/2015  . Prediabetes 07/24/2015  . Fibrocystic breast changes 07/24/2015  . Anxiety 05/24/2012    Allergies  Allergen Reactions  . Ace Inhibitors Other (See Comments)    Cough  . Hydrochlorothiazide Other (See Comments)    Dizziness  . Triamterene-Hctz Other (See Comments)    Appetite loss    Past Surgical History:  Procedure Laterality Date  . ABDOMINAL HYSTERECTOMY  1980  . BREAST BIOPSY Left 2018  DCIS  . 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 BREAST LUMPECTOMY WITH NEEDLE LOCALIZATION Left 04/07/2017   Procedure: BREAST LUMPECTOMY WITH NEEDLE LOCALIZATION;  Surgeon: Vickie Epley, MD;  Location: [facility] OR[facility];  Service: General;  Laterality: Left;  . BREAST SURGERY Left    Breast  Biopsy  . CATARACT EXTRACTION W/PHACO Right 06/12/2016   Procedure: CATARACT EXTRACTION PHACO AND INTRAOCULAR LENS PLACEMENT ([facility]);  Surgeon: Eulogio Bear, MD;  Location: [facility];  Service: Ophthalmology;  Laterality: Right;  [place] 6.1 AP% 12.7 CDE .77 FLUID PACK LOT # [id number]  . COLONOSCOPY  08/02/2012   Dr. Donnella Sham - diverticuli otherwise normal  . ESOPHAGOGASTRODUODENOSCOPY  08/02/2012   chronic gastritis  . EYE SURGERY Right    Cataract Extraction with IOL  . OOPHORECTOMY      Social History   Tobacco Use  . Smoking status: Never Smoker  . Smokeless tobacco: Never Used  . Tobacco comment: smoking cessation materials not required  Vaping Use  . Vaping Use: Never used  Substance Use Topics  . Alcohol use: No    Alcohol/week: 0.0 standard drinks  . Drug use: No     Medication list has been reviewed and updated.  Current Meds  Medication Sig  . ACCU-CHEK AVIVA PLUS test strip TEST TWICE DAILY  . Accu-Chek Softclix Lancets lancets 1 each by Other route 2 (two) times daily. Use as instructed  . anastrozole (ARIMIDEX) 1 MG tablet TAKE 1 TABLET BY MOUTH  DAILY  . aspirin 81 MG chewable tablet Chew by mouth daily.  [name] Kitchen atorvastatin (LIPITOR) 10 MG tablet TAKE 1 TABLET BY MOUTH  DAILY  . calcium carbonate (OSCAL) 1500 (600 Ca) MG TABS tablet Take by mouth 2 (two) times daily with a meal.  . fluticasone (FLONASE) 50 MCG/ACT nasal spray Place 2 sprays into both nostrils daily.  . irbesartan (AVAPRO) 75 MG tablet TAKE 1 TABLET BY MOUTH  DAILY  . triamcinolone cream (KENALOG) 0.1 % Apply 1 application topically 2 (two) times daily. Apply to lesions on both lower legs twice a day    PHQ 2/9 Scores 06/04/2020 05/21/2020 04/19/2020 04/11/2020  PHQ - 2 Score 0 0 0 0  PHQ- 9 Score 0 0 2 -    GAD 7 : Generalized Anxiety Score 06/04/2020 05/21/2020 04/19/2020  Nervous, Anxious, on Edge 0 0 0  Control/stop worrying 0 0 0  Worry too much - different things 0 0 0  Trouble relaxing 0 0 0    Restless 0 0 0  Easily annoyed or irritable 1 0 0  Afraid - awful might happen 0 0 0  Total GAD 7 Score 1 0 0  Anxiety Difficulty Not difficult at all - Not difficult at all    BP Readings from Last 3 Encounters:  08/21/20 130/78  08/17/20 130/65  06/04/20 (!) 132/78    Physical Exam Vitals and nursing note reviewed.  Constitutional:      General: She is not in acute distress.    Appearance: She is well-developed.  HENT:     Head: Normocephalic and atraumatic.  Neck:     Vascular: No carotid bruit.  Cardiovascular:     Rate and Rhythm: Normal rate and regular rhythm.     Pulses: Normal pulses.     Heart sounds: No murmur heard.   Pulmonary:     Effort: Pulmonary effort is normal. No respiratory distress.  Breath sounds: No wheezing or rhonchi.  Musculoskeletal:        General: Normal range of motion.     Cervical back: Normal range of motion.     Right lower leg: Edema (trace) present.     Left lower leg: Edema (trace) present.  Lymphadenopathy:     Cervical: No cervical adenopathy.  Skin:    General: Skin is warm and dry.     Capillary Refill: Capillary refill takes less than 2 seconds.     Findings: No rash.  Neurological:     Mental Status: She is alert and oriented to person, place, and time.  Psychiatric:        Behavior: Behavior normal.        Thought Content: Thought content normal.     Wt Readings from Last 3 Encounters:  08/21/20 134 lb (60.8 kg)  08/17/20 135 lb 4.8 oz (61.4 kg)  06/04/20 134 lb (60.8 kg)    BP 130/78 (BP Location: Right Arm, Patient Position: Sitting)   Pulse 72   Temp 98.2 F (36.8 C) (Oral)   Ht 5\' 6"  (1.676 m)   Wt 134 lb (60.8 kg)   SpO2 98%   BMI 21.63 kg/m   Assessment and Plan: 1. Prediabetes Glucoses are normal fasting and slightly elevated randomly Most recent glucose on labs was 64  Will continue to monitor at home - return for follow up if rising  2. Stage 3a chronic kidney disease (H[facility]) Stable GFR on  recent labs  3. Essential (primary) hypertension Clinically stable exam with well controlled BP on irbesartan. Tolerating medications without side effects at this time. Pt to continue current regimen and low sodium diet; benefits of regular exercise as able discussed.  4. Malignant neoplasm of left breast in female, estrogen receptor positive, unspecified site of breast ([place]) Followed by Oncology On Arimidex AI therapy   Partially dictated using Dragon software. Any errors are unintentional.  Halina Maidens, MD [facility]  08/21/2020

## 2020-08-21 NOTE — Progress Notes (Signed)
Hematology/Oncology Consult note [facility]  Telephone:([phone] [phone]  Patient Care Team: Glean Hess, MD as PCP - General (Internal Medicine) Sindy Guadeloupe, MD as Consulting Physician (Oncology) Eulogio Bear, MD as Consulting Physician (Ophthalmology)   Name of the patient: Brandy Diaz  [id number]  09-May-1946   Date of visit: 08/21/20  Diagnosis- left breast ER+ DCIS  Chief complaint/ Reason for visit-routine follow-up for ER positive DCIS  Heme/Onc history: Patient is a 74 year old female with a past medical history significant for hypertension and diet-controlled diabetes who underwent screening mammogram which showed an abnormal mass in the left breast 3 cm from the nipple 1 x 0.9 x 0.3 cm in size and mobile mass at 4:00 position 0.8 x 0.7 x 0.5 cm in size. Evaluation of axilla was negative for adenopathy. This was followed by ultrasound and the mass at the 5:30 position was stable long-term and consistent with benign lesion and core biopsy of the 4:00 position mass was recommended.  2. 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   3. 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 No necrosis was identified. Margins were negative for DCIS 8 mm from the deep margin. ER 5190% positive PR negative  4. Bone density scan in June 2018 showed T score of -2.2 consistent with osteopenia. 10 year probability of a major osteoporotic fracture was 6% andhipfracture was 1.5%. Patient started on arimidex in June 2018   Interval history-patient's weight has stabilized and she has not lost any further weight in the last 3 months. She is hoping to gain back some weight.Tolerating Arimidex well without any significant side effects. She does have mild bilateral ankle edema which has been  stable as well  ECOG PS- 1 Pain scale- 0  Review of systems- Review of Systems  Constitutional: Positive for malaise/fatigue. Negative for chills, fever and weight loss.  HENT: Negative for congestion, ear discharge and nosebleeds.   Eyes: Negative for blurred vision.  Respiratory: Negative for cough, hemoptysis, sputum production, shortness of breath and wheezing.   Cardiovascular: Negative for chest pain, palpitations, orthopnea and claudication.  Gastrointestinal: Negative for abdominal pain, blood in stool, constipation, diarrhea, heartburn, melena, nausea and vomiting.  Genitourinary: Negative for dysuria, flank pain, frequency, hematuria and urgency.  Musculoskeletal: Negative for back pain, joint pain and myalgias.  Skin: Negative for rash.  Neurological: Negative for dizziness, tingling, focal weakness, seizures, weakness and headaches.  Endo/Heme/Allergies: Does not bruise/bleed easily.  Psychiatric/Behavioral: Negative for depression and suicidal ideas. The patient does not have insomnia.       Allergies  Allergen Reactions  . Ace Inhibitors Other (See Comments)    Cough  . Hydrochlorothiazide Other (See Comments)    Dizziness  . Triamterene-Hctz Other (See Comments)    Appetite loss     Past Medical History:  Diagnosis Date  . Anemia   . Breast cancer ([place]) 2018   Left  . Diabetes mellitus without complication (HCC)    diet controlled  . Edema    ankles  . GERD (gastroesophageal reflux disease)   . Hypertension   . Unexplained weight loss 07/24/2015   EGD and Colonoscopy normal 2014      Past Surgical History:  Procedure Laterality Date  . ABDOMINAL HYSTERECTOMY  1980  . BREAST BIOPSY Left 2018   DCIS  .  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 BREAST LUMPECTOMY WITH NEEDLE LOCALIZATION Left 04/07/2017   Procedure: BREAST LUMPECTOMY WITH NEEDLE LOCALIZATION;  Surgeon: Vickie Epley,  MD;  Location: [facility] ORS;  Service: General;  Laterality: Left;  . BREAST SURGERY Left    Breast Biopsy  . CATARACT EXTRACTION W/PHACO Right 06/12/2016   Procedure: CATARACT EXTRACTION PHACO AND INTRAOCULAR LENS PLACEMENT (IOC);  Surgeon: Eulogio Bear, MD;  Location: [facility] ORS;  Service: Ophthalmology;  Laterality: Right;  [place] 6.1 AP% 12.7 CDE .77 FLUID PACK LOT # [id number]  . COLONOSCOPY  08/02/2012   Dr. Donnella Sham - diverticuli otherwise normal  . ESOPHAGOGASTRODUODENOSCOPY  08/02/2012   chronic gastritis  . EYE SURGERY Right    Cataract Extraction with IOL  . OOPHORECTOMY      Social History   Socioeconomic History  . Marital status: Married    Spouse name: Not on file  . Number of children: 0  . Years of education: Not on file  . Highest education level: 10th grade  Occupational History  . Occupation: Retired  Tobacco Use  . Smoking status: Never Smoker  . Smokeless tobacco: Never Used  . Tobacco comment: smoking cessation materials not required  Vaping Use  . Vaping Use: Never used  Substance and Sexual Activity  . Alcohol use: No    Alcohol/week: 0.0 standard drinks  . Drug use: No  . Sexual activity: Not Currently  Other Topics Concern  . Not on file  Social History Narrative  . Not on file   Social Determinants of Health   Financial Resource Strain: Low Risk   . Difficulty of Paying Living Expenses: Not very hard  Food Insecurity: No Food Insecurity  . Worried About Charity fundraiser in the Last Year: Never true  . Ran Out of Food in the Last Year: Never true  Transportation Needs: No Transportation Needs  . Lack of Transportation (Medical): No  . Lack of Transportation (Non-Medical): No  Physical Activity: Inactive  . Days of Exercise per Week: 0 days  . Minutes of Exercise per Session: 0 min  Stress: No Stress Concern Present  . Feeling of Stress : Not at all  Social Connections: Moderately Integrated  . Frequency of Communication with Friends and  Family: More than three times a week  . Frequency of Social Gatherings with Friends and Family: More than three times a week  . Attends Religious Services: More than 4 times per year  . Active Member of Clubs or Organizations: No  . Attends Archivist Meetings: Never  . Marital Status: Married  Human resources officer Violence: Not At Risk  . Fear of Current or Ex-Partner: No  . Emotionally Abused: No  . Physically Abused: No  . Sexually Abused: No    Family History  Problem Relation Age of Onset  . Breast cancer Mother 82  . Diabetes Mother   . Hypertension Mother   . CAD Father   . Breast cancer Sister   . Hypertension Sister   . Breast cancer Maternal Aunt 61  . Throat cancer Brother      Current Outpatient Medications:  .  ACCU-CHEK AVIVA PLUS test strip, TEST TWICE DAILY, Disp: 200 strip, Rfl: 3 .  Accu-Chek Softclix Lancets lancets, 1 each by Other route 2 (two) times daily. Use as instructed, Disp: 100 each, Rfl: 12 .  anastrozole (ARIMIDEX) 1 MG tablet, TAKE 1  TABLET BY MOUTH  DAILY, Disp: 90 tablet, Rfl: 3 .  aspirin 81 MG chewable tablet, Chew by mouth daily., Disp: , Rfl:  .  atorvastatin (LIPITOR) 10 MG tablet, TAKE 1 TABLET BY MOUTH  DAILY, Disp: 90 tablet, Rfl: 3 .  calcium carbonate (OSCAL) 1500 (600 Ca) MG TABS tablet, Take by mouth 2 (two) times daily with a meal., Disp: , Rfl:  .  fluticasone (FLONASE) 50 MCG/ACT nasal spray, Place 2 sprays into both nostrils daily., Disp: 48 g, Rfl: 3 .  irbesartan (AVAPRO) 75 MG tablet, TAKE 1 TABLET BY MOUTH  DAILY, Disp: 90 tablet, Rfl: 1 .  triamcinolone cream (KENALOG) 0.1 %, Apply 1 application topically 2 (two) times daily. Apply to lesions on both lower legs twice a day, Disp: 30 g, Rfl: 5  Physical exam:  Vitals:   08/17/20 0950  BP: 130/65  Pulse: 86  Resp: 18  Temp: (!) 96.9 F (36.1 C)  TempSrc: Tympanic  SpO2: 100%  Weight: 135 lb 4.8 oz (61.4 kg)   Physical Exam Constitutional:      General: She  is not in acute distress. Cardiovascular:     Rate and Rhythm: Normal rate and regular rhythm.     Heart sounds: Normal heart sounds.  Pulmonary:     Effort: Pulmonary effort is normal.     Breath sounds: Normal breath sounds.  Abdominal:     General: Bowel sounds are normal.     Palpations: Abdomen is soft.  Musculoskeletal:     Comments: Bilateral +1 ankle edema  Skin:    General: Skin is warm and dry.  Neurological:     Mental Status: She is alert and oriented to person, place, and time.    Breast exam was performed in seated and lying down position. Patient is status post left lumpectomy with a well-healed surgical scar. No evidence of any palpable masses. No evidence of axillary adenopathy. No evidence of any palpable masses or lumps in the right breast. No evidence of right axillary adenopathy   CMP Latest Ref Rng & Units 08/17/2020  Glucose 70 - 99 mg/dL 64(L)  BUN 8 - 23 mg/dL 17  Creatinine 0.44 - 1.00 mg/dL 1.17(H)  Sodium 135 - 145 mmol/L 138  Potassium 3.5 - 5.1 mmol/L 4.1  Chloride 98 - 111 mmol/L 102  CO2 22 - 32 mmol/L 28  Calcium 8.9 - 10.3 mg/dL 9.2  Total Protein 6.5 - 8.1 g/dL 7.5  Total Bilirubin 0.3 - 1.2 mg/dL 0.5  Alkaline Phos 38 - 126 U/L 64  AST 15 - 41 U/L 21  ALT 0 - 44 U/L 12   CBC Latest Ref Rng & Units 08/17/2020  WBC 4.0 - 10.5 K/uL 5.5  Hemoglobin 12.0 - 15.0 g/dL 11.9(L)  Hematocrit 36 - 46 % 34.4(L)  Platelets 150 - 400 K/uL 319      Assessment and plan- Patient is a 74 y.o. female with left breast ER positive DCIS on Arimidex and this is a routine follow-up visit  Clinically patient is doing well with no concerning signs and symptoms of recurrence based on today's exam.  She will need a mammogram in November 2021 which I will schedule.  She is tolerating Arimidex well without any significant side effects along with calcium and vitamin D.  I will see her back in 6 months in person no labs   Visit Diagnosis 1. Ductal carcinoma in situ  (DCIS) of left breast      Dr. Astrid Divine  Janese Banks, MD, MPH [place] [facility] [phone] 08/21/2020 12:32 PM

## 2020-09-26 ENCOUNTER — Other Ambulatory Visit: Payer: Self-pay

## 2020-09-26 ENCOUNTER — Ambulatory Visit
Admission: RE | Admit: 2020-09-26 | Discharge: 2020-09-26 | Disposition: A | Discharge status: Home / Self Care | Payer: Medicare Other | Source: Ambulatory Visit | Attending: Oncology | Admitting: Oncology

## 2020-09-26 DIAGNOSIS — D0512 Intraductal carcinoma in situ of left breast: Secondary | ICD-10-CM | POA: Diagnosis present

## 2020-09-26 DIAGNOSIS — Z853 Personal history of malignant neoplasm of breast: Secondary | ICD-10-CM | POA: Insufficient documentation

## 2020-09-26 DIAGNOSIS — Z08 Encounter for follow-up examination after completed treatment for malignant neoplasm: Secondary | ICD-10-CM

## 2020-11-19 ENCOUNTER — Other Ambulatory Visit: Payer: Self-pay

## 2020-11-19 ENCOUNTER — Telehealth: Payer: Self-pay

## 2020-11-19 MED ORDER — BLOOD GLUCOSE METER KIT: PACK | 0 refills | Status: DC

## 2020-11-19 NOTE — Telephone Encounter (Unsigned)
Copied from [place] [phone]. Topic: General - Inquiry >> Nov 19, 2020 11:56 AM Greggory Keen D wrote: Reason for CRM: Pt called saying she needs an order for a glucose kit.  She was using Accuchek aviva.  [facility]

## 2020-11-19 NOTE — Telephone Encounter (Signed)
Printed script and set in [facility] to sign so I can fax to [facility] for patient for generic script.

## 2020-12-23 ENCOUNTER — Other Ambulatory Visit: Payer: Self-pay | Admitting: Internal Medicine

## 2020-12-23 DIAGNOSIS — I10 Essential (primary) hypertension: Secondary | ICD-10-CM

## 2021-02-15 ENCOUNTER — Inpatient Hospital Stay: Payer: Medicare Other | Admitting: Oncology

## 2021-02-25 DIAGNOSIS — H269 Unspecified cataract: Secondary | ICD-10-CM | POA: Diagnosis not present

## 2021-02-25 LAB — HM DIABETES EYE EXAM

## 2021-02-26 ENCOUNTER — Encounter: Payer: Self-pay | Admitting: Internal Medicine

## 2021-03-01 ENCOUNTER — Inpatient Hospital Stay: Payer: Medicare Other | Attending: Oncology | Admitting: Oncology

## 2021-03-01 VITALS — BP 139/67 | HR 88 | Temp 97.5°F | Resp 20 | Wt 129.5 lb

## 2021-03-01 DIAGNOSIS — Z08 Encounter for follow-up examination after completed treatment for malignant neoplasm: Secondary | ICD-10-CM | POA: Diagnosis not present

## 2021-03-01 DIAGNOSIS — E119 Type 2 diabetes mellitus without complications: Secondary | ICD-10-CM | POA: Insufficient documentation

## 2021-03-01 DIAGNOSIS — Z833 Family history of diabetes mellitus: Secondary | ICD-10-CM | POA: Diagnosis not present

## 2021-03-01 DIAGNOSIS — Z808 Family history of malignant neoplasm of other organs or systems: Secondary | ICD-10-CM | POA: Insufficient documentation

## 2021-03-01 DIAGNOSIS — Z8249 Family history of ischemic heart disease and other diseases of the circulatory system: Secondary | ICD-10-CM | POA: Diagnosis not present

## 2021-03-01 DIAGNOSIS — Z90721 Acquired absence of ovaries, unilateral: Secondary | ICD-10-CM | POA: Diagnosis not present

## 2021-03-01 DIAGNOSIS — I10 Essential (primary) hypertension: Secondary | ICD-10-CM | POA: Diagnosis not present

## 2021-03-01 DIAGNOSIS — Z79811 Long term (current) use of aromatase inhibitors: Secondary | ICD-10-CM | POA: Diagnosis not present

## 2021-03-01 DIAGNOSIS — D0512 Intraductal carcinoma in situ of left breast: Secondary | ICD-10-CM | POA: Diagnosis not present

## 2021-03-01 DIAGNOSIS — Z803 Family history of malignant neoplasm of breast: Secondary | ICD-10-CM | POA: Diagnosis not present

## 2021-03-01 DIAGNOSIS — M8580 Other specified disorders of bone density and structure, unspecified site: Secondary | ICD-10-CM

## 2021-03-01 DIAGNOSIS — Z79899 Other long term (current) drug therapy: Secondary | ICD-10-CM | POA: Insufficient documentation

## 2021-03-01 DIAGNOSIS — Z5181 Encounter for therapeutic drug level monitoring: Secondary | ICD-10-CM | POA: Diagnosis not present

## 2021-03-01 DIAGNOSIS — Z170 Estrogen receptor positive status [ER+]: Secondary | ICD-10-CM | POA: Diagnosis not present

## 2021-03-01 DIAGNOSIS — Z853 Personal history of malignant neoplasm of breast: Secondary | ICD-10-CM

## 2021-03-03 NOTE — Progress Notes (Signed)
Hematology/Oncology Consult note [facility]  [phone] [phone]  Patient Care Team: Glean Hess, MD as PCP - General (Internal Medicine) Sindy Guadeloupe, MD as Consulting Physician (Oncology) Eulogio Bear, MD as Consulting Physician (Ophthalmology)   Name of the patient: Brandy Diaz  [id number]  02-27-1946   Date of visit: 03/03/21  Diagnosis- left breast ER+ DCIS  Chief complaint/ Reason for visit-routine follow-up of DCIS  Heme/Onc history: Patient is a 75 year old female with a past medical history significant for hypertension and diet-controlled diabeteswhounderwent screening mammogram which showed an abnormal mass in the left breast 3 cm from the nipple 1 x 0.9 x 0.3 cm in size and mobile mass at 4:00 position 0.8 x 0.7 x 0.5 cm in size. Evaluation of axilla was negative for adenopathy. This was followed by ultrasound and the mass at the 5:30 position was stable long-term and consistent with benign lesion and core biopsy of the 4:00 position mass was recommended.  2. 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   3. 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 No necrosis was identified. Margins were negative for DCIS 8 mm from the deep margin. ER 5190% positive PR negative  4.Bone density scan in June 2018 showed T score of -2.2 consistent with osteopenia. 10 year probability of a major osteoporotic fracture was 6% andhipfracture was 1.5%. Patient started on arimidex in June 2018    Interval history-patient reports doing well.  She is tolerating Arimidex without any significant side effects.  She has lost 5 pounds over the last 6 months.  ECOG PS- 1 Pain scale- 0   Review of systems- Review of Systems  Constitutional: Negative for chills, fever,  malaise/fatigue and weight loss.  HENT: Negative for congestion, ear discharge and nosebleeds.   Eyes: Negative for blurred vision.  Respiratory: Negative for cough, hemoptysis, sputum production, shortness of breath and wheezing.   Cardiovascular: Negative for chest pain, palpitations, orthopnea and claudication.  Gastrointestinal: Negative for abdominal pain, blood in stool, constipation, diarrhea, heartburn, melena, nausea and vomiting.  Genitourinary: Negative for dysuria, flank pain, frequency, hematuria and urgency.  Musculoskeletal: Negative for back pain, joint pain and myalgias.  Skin: Negative for rash.  Neurological: Negative for dizziness, tingling, focal weakness, seizures, weakness and headaches.  Endo/Heme/Allergies: Does not bruise/bleed easily.  Psychiatric/Behavioral: Negative for depression and suicidal ideas. The patient does not have insomnia.        Allergies  Allergen Reactions  . Ace Inhibitors Other (See Comments)    Cough  . Hydrochlorothiazide Other (See Comments)    Dizziness  . Triamterene-Hctz Other (See Comments)    Appetite loss     Past Medical History:  Diagnosis Date  . Anemia   . Breast cancer ([place]) 2018   Left  . Diabetes mellitus without complication (HCC)    diet controlled  . Edema    ankles  . GERD (gastroesophageal reflux disease)   . Hypertension   . Unexplained weight loss 07/24/2015   EGD and Colonoscopy normal 2014      Past Surgical History:  Procedure Laterality Date  . ABDOMINAL HYSTERECTOMY  1980  . BREAST BIOPSY Left 2018   DCIS  . 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 BREAST LUMPECTOMY WITH NEEDLE  LOCALIZATION Left 04/07/2017   Procedure: BREAST LUMPECTOMY WITH NEEDLE LOCALIZATION;  Surgeon: Vickie Epley, MD;  Location: [facility] ORS;  Service: General;  Laterality: Left;  . BREAST SURGERY Left    Breast Biopsy  . CATARACT EXTRACTION W/PHACO  Right 06/12/2016   Procedure: CATARACT EXTRACTION PHACO AND INTRAOCULAR LENS PLACEMENT (IOC);  Surgeon: Eulogio Bear, MD;  Location: [facility] ORS;  Service: Ophthalmology;  Laterality: Right;  [place] 6.1 AP% 12.7 CDE .77 FLUID PACK LOT # [id number]  . COLONOSCOPY  08/02/2012   Dr. Donnella Sham - diverticuli otherwise normal  . ESOPHAGOGASTRODUODENOSCOPY  08/02/2012   chronic gastritis  . EYE SURGERY Right    Cataract Extraction with IOL  . OOPHORECTOMY      Social History   Socioeconomic History  . Marital status: Married    Spouse name: Not on file  . Number of children: 0  . Years of education: Not on file  . Highest education level: 10th grade  Occupational History  . Occupation: Retired  Tobacco Use  . Smoking status: Never Smoker  . Smokeless tobacco: Never Used  . Tobacco comment: smoking cessation materials not required  Vaping Use  . Vaping Use: Never used  Substance and Sexual Activity  . Alcohol use: No    Alcohol/week: 0.0 standard drinks  . Drug use: No  . Sexual activity: Not Currently  Other Topics Concern  . Not on file  Social History Narrative  . Not on file   Social Determinants of Health   Financial Resource Strain: Low Risk   . Difficulty of Paying Living Expenses: Not very hard  Food Insecurity: No Food Insecurity  . Worried About Charity fundraiser in the Last Year: Never true  . Ran Out of Food in the Last Year: Never true  Transportation Needs: No Transportation Needs  . Lack of Transportation (Medical): No  . Lack of Transportation (Non-Medical): No  Physical Activity: Inactive  . Days of Exercise per Week: 0 days  . Minutes of Exercise per Session: 0 min  Stress: No Stress Concern Present  . Feeling of Stress : Not at all  Social Connections: Moderately Integrated  . Frequency of Communication with Friends and Family: More than three times a week  . Frequency of Social Gatherings with Friends and Family: More than three times a week  . Attends  Religious Services: More than 4 times per year  . Active Member of Clubs or Organizations: No  . Attends Archivist Meetings: Never  . Marital Status: Married  Human resources officer Violence: Not At Risk  . Fear of Current or Ex-Partner: No  . Emotionally Abused: No  . Physically Abused: No  . Sexually Abused: No    Family History  Problem Relation Age of Onset  . Breast cancer Mother 46  . Diabetes Mother   . Hypertension Mother   . CAD Father   . Breast cancer Sister   . Hypertension Sister   . Breast cancer Maternal Aunt 35  . Throat cancer Brother      Current Outpatient Medications:  .  ACCU-CHEK AVIVA PLUS test strip, TEST TWICE DAILY, Disp: 200 strip, Rfl: 3 .  Accu-Chek Softclix Lancets lancets, 1 each by Other route 2 (two) times daily. Use as instructed, Disp: 100 each, Rfl: 12 .  anastrozole (ARIMIDEX) 1 MG tablet, TAKE 1 TABLET BY MOUTH  DAILY, Disp: 90 tablet, Rfl: 3 .  aspirin 81 MG chewable tablet, Chew by mouth daily., Disp: , Rfl:  .  atorvastatin (LIPITOR) 10 MG tablet, TAKE 1 TABLET BY MOUTH  DAILY, Disp: 90 tablet, Rfl: 3 .  blood glucose meter kit and supplies, Dispense based on patient and insurance preference. Use up to four times daily as needed. (FOR ICD-10 E11.9)., Disp: 1 each, Rfl: 0 .  calcium carbonate (OSCAL) 1500 (600 Ca) MG TABS tablet, Take by mouth 2 (two) times daily with a meal., Disp: , Rfl:  .  fluticasone (FLONASE) 50 MCG/ACT nasal spray, Place 2 sprays into both nostrils daily., Disp: 48 g, Rfl: 3 .  irbesartan (AVAPRO) 75 MG tablet, TAKE 1 TABLET BY MOUTH  DAILY, Disp: 90 tablet, Rfl: 1 .  triamcinolone cream (KENALOG) 0.1 %, Apply 1 application topically 2 (two) times daily. Apply to lesions on both lower legs twice a day, Disp: 30 g, Rfl: 5  Physical exam:  Vitals:   03/01/21 1138  BP: 139/67  Pulse: 88  Resp: 20  Temp: (!) 97.5 F (36.4 C)  TempSrc: Tympanic  SpO2: 98%  Weight: 129 lb 8 oz (58.7 kg)   Physical  Exam Constitutional:      General: She is not in acute distress. Cardiovascular:     Rate and Rhythm: Normal rate and regular rhythm.     Heart sounds: Normal heart sounds.  Pulmonary:     Effort: Pulmonary effort is normal.     Breath sounds: Normal breath sounds.  Abdominal:     General: Bowel sounds are normal.     Palpations: Abdomen is soft.  Skin:    General: Skin is warm and dry.  Neurological:     Mental Status: She is alert and oriented to person, place, and time.    Breast exam was performed in seated and lying down position. Patient is status post left lumpectomy with a well-healed surgical scar. No evidence of any palpable masses. No evidence of axillary adenopathy. No evidence of any palpable masses or lumps in the right breast. No evidence of right axillary adenopathy   CMP Latest Ref Rng & Units 08/17/2020  Glucose 70 - 99 mg/dL 64(L)  BUN 8 - 23 mg/dL 17  Creatinine 0.44 - 1.00 mg/dL 1.17(H)  Sodium 135 - 145 mmol/L 138  Potassium 3.5 - 5.1 mmol/L 4.1  Chloride 98 - 111 mmol/L 102  CO2 22 - 32 mmol/L 28  Calcium 8.9 - 10.3 mg/dL 9.2  Total Protein 6.5 - 8.1 g/dL 7.5  Total Bilirubin 0.3 - 1.2 mg/dL 0.5  Alkaline Phos 38 - 126 U/L 64  AST 15 - 41 U/L 21  ALT 0 - 44 U/L 12   CBC Latest Ref Rng & Units 08/17/2020  WBC 4.0 - 10.5 K/uL 5.5  Hemoglobin 12.0 - 15.0 g/dL 11.9(L)  Hematocrit 36.0 - 46.0 % 34.4(L)  Platelets 150 - 400 K/uL 319    Assessment and plan- Patient is a 75 y.o. female with left breast ER positive DCIS on Arimidex here for routine follow-up  Clinically patient is doing well with no concerning signs and symptoms of recurrence based on today's exam.  She would need a mammogram in November 2022 as well as a bone density at that time which we will schedule.  Patient will need to take Arimidex for 5 years ending in 2023.  I will see her back in 6 months no labs Visit Diagnosis 1. Encounter for follow-up surveillance of breast cancer   2.  Visit for monitoring Arimidex therapy   3. Osteopenia, unspecified location  Dr. Randa Evens, MD, MPH [facility] [phone] 03/03/2021 6:54 PM              '

## 2021-03-13 ENCOUNTER — Other Ambulatory Visit: Payer: Self-pay | Admitting: Internal Medicine

## 2021-03-13 ENCOUNTER — Other Ambulatory Visit: Payer: Self-pay | Admitting: Ophthalmology

## 2021-03-13 DIAGNOSIS — H2589 Other age-related cataract: Secondary | ICD-10-CM | POA: Diagnosis not present

## 2021-03-13 DIAGNOSIS — I872 Venous insufficiency (chronic) (peripheral): Secondary | ICD-10-CM

## 2021-03-13 NOTE — Telephone Encounter (Signed)
Requested medication (s) are due for refill today: Yes  Requested medication (s) are on the active medication list: Yes  Last refill:  11/08/19  Future visit scheduled: Yes  Notes to clinic:  Prescription has expired.    Requested Prescriptions  Pending Prescriptions Disp Refills   triamcinolone cream (KENALOG) 0.1 % [Pharmacy Med Name: triamcinolone acetonide 0.1 % topical cream] 30 g 5    Sig: APPLY 1 APPLICATION TOPICALLY TO LESIONS ON BOTH LOWER LEGS TWICE A DAY      Dermatology:  Corticosteroids Passed - 03/13/2021  3:08 PM      Passed - Valid encounter within last 12 months    Recent Outpatient Visits           6 months ago Prediabetes   [facility] Glean Hess, MD   9 months ago Venous stasis dermatitis of both lower extremities   [facility] Glean Hess, MD   9 months ago Essential (primary) hypertension   [facility] Glean Hess, MD   10 months ago Annual physical exam   [facility] Glean Hess, MD   1 year ago Essential (primary) hypertension   [facility] Glean Hess, MD       Future Appointments             In 1 month Army Melia, Jesse Sans, MD [facility]

## 2021-03-25 ENCOUNTER — Encounter: Payer: Self-pay | Admitting: Ophthalmology

## 2021-03-31 NOTE — Anesthesia Preprocedure Evaluation (Addendum)
Anesthesia Evaluation  Patient identified by MRN, date of birth, ID band Patient awake    Reviewed: Allergy & Precautions, NPO status , Patient's Chart, lab work & pertinent test results  History of Anesthesia Complications Negative for: history of anesthetic complications  Airway Mallampati: IV   Neck ROM: Full    Dental   Missing molars, some chipped:   Pulmonary neg pulmonary ROS,    Pulmonary exam normal breath sounds clear to auscultation       Cardiovascular hypertension, Normal cardiovascular exam Rhythm:Regular Rate:Normal     Neuro/Psych negative neurological ROS     GI/Hepatic PUD, GERD  ,  Endo/Other  diabetes, Type 2  Renal/GU Renal disease (stage III CKD)     Musculoskeletal   Abdominal   Peds  Hematology  (+) Blood dyscrasia, anemia , Breast CA   Anesthesia Other Findings   Reproductive/Obstetrics                            Anesthesia Physical Anesthesia Plan  ASA: II  Anesthesia Plan: MAC   Post-op Pain Management:    Induction: Intravenous  PONV Risk Score and Plan: 2 and TIVA, Midazolam and Treatment may vary due to age or medical condition  Airway Management Planned: Nasal Cannula  Additional Equipment:   Intra-op Plan:   Post-operative Plan:   Informed Consent: I have reviewed the patients History and Physical, chart, labs and discussed the procedure including the risks, benefits and alternatives for the proposed anesthesia with the patient or authorized representative who has indicated his/her understanding and acceptance.       Plan Discussed with: CRNA  Anesthesia Plan Comments:        Anesthesia Quick Evaluation

## 2021-04-01 NOTE — Discharge Instructions (Signed)

## 2021-04-02 ENCOUNTER — Ambulatory Visit: Payer: Medicare Other | Admitting: Anesthesiology

## 2021-04-02 ENCOUNTER — Ambulatory Visit
Admission: RE | Admit: 2021-04-02 | Discharge: 2021-04-02 | Disposition: A | Discharge status: Home / Self Care | Payer: Medicare Other | Attending: Ophthalmology | Admitting: Ophthalmology

## 2021-04-02 ENCOUNTER — Encounter: Payer: Self-pay | Admitting: Ophthalmology

## 2021-04-02 ENCOUNTER — Other Ambulatory Visit: Payer: Self-pay

## 2021-04-02 ENCOUNTER — Encounter
Admission: RE | Disposition: A | Discharge status: Home / Self Care | Payer: Self-pay | Source: Home / Self Care | Attending: Ophthalmology

## 2021-04-02 DIAGNOSIS — E1136 Type 2 diabetes mellitus with diabetic cataract: Secondary | ICD-10-CM | POA: Insufficient documentation

## 2021-04-02 DIAGNOSIS — Z961 Presence of intraocular lens: Secondary | ICD-10-CM | POA: Insufficient documentation

## 2021-04-02 DIAGNOSIS — Z7982 Long term (current) use of aspirin: Secondary | ICD-10-CM | POA: Insufficient documentation

## 2021-04-02 DIAGNOSIS — Z79811 Long term (current) use of aromatase inhibitors: Secondary | ICD-10-CM | POA: Insufficient documentation

## 2021-04-02 DIAGNOSIS — Z888 Allergy status to other drugs, medicaments and biological substances status: Secondary | ICD-10-CM | POA: Insufficient documentation

## 2021-04-02 DIAGNOSIS — C50912 Malignant neoplasm of unspecified site of left female breast: Secondary | ICD-10-CM | POA: Insufficient documentation

## 2021-04-02 DIAGNOSIS — H25812 Combined forms of age-related cataract, left eye: Secondary | ICD-10-CM | POA: Diagnosis not present

## 2021-04-02 DIAGNOSIS — H2589 Other age-related cataract: Secondary | ICD-10-CM | POA: Diagnosis not present

## 2021-04-02 DIAGNOSIS — Z79899 Other long term (current) drug therapy: Secondary | ICD-10-CM | POA: Diagnosis not present

## 2021-04-02 DIAGNOSIS — H2512 Age-related nuclear cataract, left eye: Secondary | ICD-10-CM | POA: Insufficient documentation

## 2021-04-02 DIAGNOSIS — Z9841 Cataract extraction status, right eye: Secondary | ICD-10-CM | POA: Insufficient documentation

## 2021-04-02 HISTORY — PX: CATARACT EXTRACTION W/PHACO: SHX586

## 2021-04-02 HISTORY — DX: Presence of dental prosthetic device (complete) (partial): Z97.2

## 2021-04-02 SURGERY — PHACOEMULSIFICATION, CATARACT, WITH IOL INSERTION
Anesthesia: Monitor Anesthesia Care | Site: Eye | Laterality: Left

## 2021-04-02 MED ORDER — EPINEPHRINE PF 1 MG/ML IJ SOLN
INTRAOCULAR | Status: DC | PRN
  Administered 2021-04-02: 117 mL via OPHTHALMIC

## 2021-04-02 MED ORDER — FENTANYL CITRATE (PF) 100 MCG/2ML IJ SOLN
INTRAMUSCULAR | Status: DC | PRN
  Administered 2021-04-02: 50 ug via INTRAVENOUS

## 2021-04-02 MED ORDER — TRYPAN BLUE 0.06 % OP SOLN
OPHTHALMIC | Status: DC | PRN
  Administered 2021-04-02: 0.5 mL via INTRAOCULAR

## 2021-04-02 MED ORDER — MOXIFLOXACIN HCL 0.5 % OP SOLN
OPHTHALMIC | Status: DC | PRN
  Administered 2021-04-02: 0.2 mL via OPHTHALMIC

## 2021-04-02 MED ORDER — LIDOCAINE HCL (PF) 2 % IJ SOLN
INTRAOCULAR | Status: DC | PRN
  Administered 2021-04-02: 1 mL via INTRAMUSCULAR

## 2021-04-02 MED ORDER — TETRACAINE HCL 0.5 % OP SOLN
1.0000 [drp] | OPHTHALMIC | Status: DC | PRN
  Administered 2021-04-02 (×3): 1 [drp] via OPHTHALMIC

## 2021-04-02 MED ORDER — NA CHONDROIT SULF-NA HYALURON 40-17 MG/ML IO SOLN
INTRAOCULAR | Status: DC | PRN
  Administered 2021-04-02: 1 mL via INTRAOCULAR

## 2021-04-02 MED ORDER — MIDAZOLAM HCL 2 MG/2ML IJ SOLN
INTRAMUSCULAR | Status: DC | PRN
  Administered 2021-04-02: 1 mg via INTRAVENOUS

## 2021-04-02 MED ORDER — LACTATED RINGERS IV SOLN: INTRAVENOUS | Status: DC

## 2021-04-02 MED ORDER — BRIMONIDINE TARTRATE-TIMOLOL 0.2-0.5 % OP SOLN
OPHTHALMIC | Status: DC | PRN
  Administered 2021-04-02: 1 [drp] via OPHTHALMIC

## 2021-04-02 MED ORDER — ARMC OPHTHALMIC DILATING DROPS
1.0000 "application " | OPHTHALMIC | Status: DC | PRN
  Administered 2021-04-02 (×3): 1 via OPHTHALMIC

## 2021-04-02 SURGICAL SUPPLY — 20 items
CANNULA ANT/CHMB 27GA (MISCELLANEOUS) ×4 IMPLANT
DEVICE MILOOP (MISCELLANEOUS) ×1 IMPLANT
GLOVE BIOGEL M 8.0 STRL (GLOVE) ×2 IMPLANT
GLOVE SURG TRIUMPH 8.0 PF LTX (GLOVE) ×2 IMPLANT
GOWN STRL REUS W/ TWL LRG LVL3 (GOWN DISPOSABLE) ×2 IMPLANT
GOWN STRL REUS W/TWL LRG LVL3 (GOWN DISPOSABLE) ×4
LENS IOL ACRYSOF IQ 21.5 (Intraocular Lens) ×2 IMPLANT
MARKER SKIN DUAL TIP RULER LAB (MISCELLANEOUS) ×2 IMPLANT
MILOOP DEVICE (MISCELLANEOUS) ×2
NEEDLE FILTER BLUNT 18X 1/2SAF (NEEDLE) ×1
NEEDLE FILTER BLUNT 18X1 1/2 (NEEDLE) ×1 IMPLANT
PACK EYE AFTER SURG (MISCELLANEOUS) ×2 IMPLANT
PACK OPTHALMIC (MISCELLANEOUS) ×2 IMPLANT
PACK PORFILIO (MISCELLANEOUS) ×2 IMPLANT
SUT ETHILON 10-0 CS-B-6CS-B-6 (SUTURE)
SUTURE EHLN 10-0 CS-B-6CS-B-6 (SUTURE) IMPLANT
SYR 3ML LL SCALE MARK (SYRINGE) ×2 IMPLANT
SYR TB 1ML LUER SLIP (SYRINGE) ×2 IMPLANT
WATER STERILE IRR 250ML POUR (IV SOLUTION) ×2 IMPLANT
WIPE NON LINTING 3.25X3.25 (MISCELLANEOUS) ×2 IMPLANT

## 2021-04-02 NOTE — Anesthesia Procedure Notes (Signed)
Procedure Name: MAC Date/Time: 04/02/2021 7:29 AM Performed by: Cameron Ali, CRNA Pre-anesthesia Checklist: Patient identified, Emergency Drugs available, Suction available, Timeout performed and Patient being monitored Patient Re-evaluated:Patient Re-evaluated prior to induction Oxygen Delivery Method: Nasal cannula Placement Confirmation: positive ETCO2

## 2021-04-02 NOTE — Transfer of Care (Signed)
Immediate Anesthesia Transfer of Care Note  Patient: Brandy Diaz  Procedure(s) Performed: CATARACT EXTRACTION PHACO AND INTRAOCULAR LENS PLACEMENT (IOC) LEFT MILOOP DIABETIC (Left Eye)  Patient Location: PACU  Anesthesia Type: MAC  Level of Consciousness: awake, alert  and patient cooperative  Airway and Oxygen Therapy: Patient Spontanous Breathing and Patient connected to supplemental oxygen  Post-op Assessment: Post-op Vital signs reviewed, Patient's Cardiovascular Status Stable, Respiratory Function Stable, Patent Airway and No signs of Nausea or vomiting  Post-op Vital Signs: Reviewed and stable  Complications: No complications documented.

## 2021-04-02 NOTE — Op Note (Signed)
PREOPERATIVE DIAGNOSIS:  Nuclear sclerotic cataract of the left eye.   POSTOPERATIVE DIAGNOSIS:  Nuclear sclerotic cataract of the left eye.   OPERATIVE PROCEDURE:@   SURGEON:  Birder Robson, MD.   ANESTHESIA:  Anesthesiologist: Darrin Nipper, MD CRNA: Cameron Ali, CRNA  1.      Managed anesthesia care. 2.     0.35ml of Shugarcaine was instilled following the paracentesis   COMPLICATIONS:  None.   TECHNIQUE:   Stop and chop   DESCRIPTION OF PROCEDURE:  The patient was examined and consented in the preoperative holding area where the aforementioned topical anesthesia was applied to the left eye and then brought back to the Operating Room where the left eye was prepped and draped in the usual sterile ophthalmic fashion and a lid speculum was placed. A paracentesis was created with the side port blade and the anterior chamber was filled with viscoelastic. A near clear corneal incision was performed with the steel keratome. A continuous curvilinear capsulorrhexis was performed with a cystotome followed by the capsulorrhexis forceps. Hydrodissection and hydrodelineation were carried out with BSS on a blunt cannula. The lens was removed in a stop and chop  technique and the remaining cortical material was removed with the irrigation-aspiration handpiece. The capsular bag was inflated with viscoelastic and the Technis ZCB00 lens was placed in the capsular bag without complication. The remaining viscoelastic was removed from the eye with the irrigation-aspiration handpiece. The wounds were hydrated. The anterior chamber was flushed with BSS and the eye was inflated to physiologic pressure. 0.81ml Vigamox was placed in the anterior chamber. The wounds were found to be water tight. The eye was dressed with Combigan. The patient was given protective glasses to wear throughout the day and a shield with which to sleep tonight. The patient was also given drops with which to begin a drop regimen today and  will follow-up with me in one day. Implant Name Type Inv. Item Serial No. Manufacturer Lot No. LRB No. Used Action  LENS IOL ACRYSOF IQ 21.5 - [id number] Intraocular Lens LENS IOL ACRYSOF IQ 21.5 [id number] ALCON  Right 1 Implanted    Procedure(s) with comments: CATARACT EXTRACTION PHACO AND INTRAOCULAR LENS PLACEMENT (IOC) LEFT MILOOP DIABETIC (Left) - Diabetic - diet controlled 52.81 03:47.0  Electronically signed: Birder Robson 04/02/2021 7:56 AM

## 2021-04-02 NOTE — Anesthesia Postprocedure Evaluation (Signed)
Anesthesia Post Note  Patient: Brandy Diaz  Procedure(s) Performed: CATARACT EXTRACTION PHACO AND INTRAOCULAR LENS PLACEMENT (IOC) LEFT MILOOP DIABETIC (Left Eye)     Patient location during evaluation: PACU Anesthesia Type: MAC Level of consciousness: awake and alert, oriented and patient cooperative Pain management: pain level controlled Vital Signs Assessment: post-procedure vital signs reviewed and stable Respiratory status: spontaneous breathing, nonlabored ventilation and respiratory function stable Cardiovascular status: blood pressure returned to baseline and stable Postop Assessment: adequate PO intake Anesthetic complications: no   No complications documented.  Darrin Nipper

## 2021-04-02 NOTE — H&P (Signed)
[facility]   Primary Care Physician:  Glean Hess, MD Ophthalmologist: Dr. George Ina  Pre-Procedure History & Physical: HPI:  Brandy Diaz is a 75 y.o. female here for cataract surgery.   Past Medical History:  Diagnosis Date  . Anemia   . Breast cancer ([place]) 2018   Left  . Diabetes mellitus without complication (HCC)    diet controlled  . Edema    ankles  . GERD (gastroesophageal reflux disease)   . Hypertension   . Unexplained weight loss 07/24/2015   EGD and Colonoscopy normal 2014   . Wears dentures    Full lower, partial upper    Past Surgical History:  Procedure Laterality Date  . ABDOMINAL HYSTERECTOMY  1980  . BREAST BIOPSY Left 2018   DCIS  . 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 BREAST LUMPECTOMY WITH NEEDLE LOCALIZATION Left 04/07/2017   Procedure: BREAST LUMPECTOMY WITH NEEDLE LOCALIZATION;  Surgeon: Vickie Epley, MD;  Location: [facility] ORS;  Service: General;  Laterality: Left;  . BREAST SURGERY Left    Breast Biopsy  . CATARACT EXTRACTION W/PHACO Right 06/12/2016   Procedure: CATARACT EXTRACTION PHACO AND INTRAOCULAR LENS PLACEMENT (IOC);  Surgeon: Eulogio Bear, MD;  Location: [facility] ORS;  Service: Ophthalmology;  Laterality: Right;  [place] 6.1 AP% 12.7 CDE .77 FLUID PACK LOT # [id number]  . COLONOSCOPY  08/02/2012   Dr. Donnella Sham - diverticuli otherwise normal  . ESOPHAGOGASTRODUODENOSCOPY  08/02/2012   chronic gastritis  . EYE SURGERY Right    Cataract Extraction with IOL  . OOPHORECTOMY      Prior to Admission medications   Medication Sig Start Date End Date Taking? Authorizing Provider  ACCU-CHEK AVIVA PLUS test strip TEST TWICE DAILY 04/22/20  Yes Glean Hess, MD  Accu-Chek Softclix Lancets lancets 1 each by Other route 2 (two) times daily. Use as instructed 04/18/19  Yes Glean Hess, MD  anastrozole (ARIMIDEX) 1 MG tablet TAKE 1 TABLET BY MOUTH  DAILY  06/29/20  Yes Sindy Guadeloupe, MD  aspirin 81 MG chewable tablet Chew by mouth daily.   Yes [provider]  atorvastatin (LIPITOR) 10 MG tablet TAKE 1 TABLET BY MOUTH  DAILY 08/16/20  Yes Glean Hess, MD  blood glucose meter kit and supplies Dispense based on patient and insurance preference. Use up to four times daily as needed. (FOR ICD-10 E11.9). 11/19/20  Yes Glean Hess, MD  calcium carbonate (OSCAL) 1500 (600 Ca) MG TABS tablet Take by mouth 2 (two) times daily with a meal.   Yes [provider]  fluticasone (FLONASE) 50 MCG/ACT nasal spray Place 2 sprays into both nostrils daily. 06/30/19  Yes Glean Hess, MD  irbesartan (AVAPRO) 75 MG tablet TAKE 1 TABLET BY MOUTH  DAILY 12/23/20  Yes Glean Hess, MD  triamcinolone cream (KENALOG) 0.1 % APPLY 1 APPLICATION TOPICALLY TO LESIONS ON BOTH LOWER LEGS TWICE A DAY 03/13/21  Yes Glean Hess, MD    Allergies as of 02/28/2021 - Review Complete 08/21/2020  Allergen Reaction Noted  . Ace inhibitors Other (See Comments) 07/24/2015  . Hydrochlorothiazide Other (See Comments) 07/24/2015  . Triamterene-hctz Other (See Comments) 12/31/2015    Family History  Problem Relation Age of Onset  . Breast cancer Mother 8  . Diabetes Mother   . Hypertension Mother   . CAD Father   . Breast cancer Sister   .  Hypertension Sister   . Breast cancer Maternal Aunt 84  . Throat cancer Brother     Social History   Socioeconomic History  . Marital status: Married    Spouse name: Not on file  . Number of children: 0  . Years of education: Not on file  . Highest education level: 10th grade  Occupational History  . Occupation: Retired  Tobacco Use  . Smoking status: Never Smoker  . Smokeless tobacco: Never Used  . Tobacco comment: smoking cessation materials not required  Vaping Use  . Vaping Use: Never used  Substance and Sexual Activity  . Alcohol use: No    Alcohol/week: 0.0 standard drinks  . Drug use:  No  . Sexual activity: Not Currently  Other Topics Concern  . Not on file  Social History Narrative  . Not on file   Social Determinants of Health   Financial Resource Strain: Low Risk   . Difficulty of Paying Living Expenses: Not very hard  Food Insecurity: No Food Insecurity  . Worried About Charity fundraiser in the Last Year: Never true  . Ran Out of Food in the Last Year: Never true  Transportation Needs: No Transportation Needs  . Lack of Transportation (Medical): No  . Lack of Transportation (Non-Medical): No  Physical Activity: Inactive  . Days of Exercise per Week: 0 days  . Minutes of Exercise per Session: 0 min  Stress: No Stress Concern Present  . Feeling of Stress : Not at all  Social Connections: Moderately Integrated  . Frequency of Communication with Friends and Family: More than three times a week  . Frequency of Social Gatherings with Friends and Family: More than three times a week  . Attends Religious Services: More than 4 times per year  . Active Member of Clubs or Organizations: No  . Attends Archivist Meetings: Never  . Marital Status: Married  Human resources officer Violence: Not At Risk  . Fear of Current or Ex-Partner: No  . Emotionally Abused: No  . Physically Abused: No  . Sexually Abused: No    Review of Systems: See HPI, otherwise negative ROS  Physical Exam: BP (!) 164/90   Pulse (!) 106   Temp (!) 97.2 F (36.2 C) (Temporal)   Ht 5' 7" (1.702 m)   Wt 59.2 kg   SpO2 100%   BMI 20.44 kg/m  General:   Alert,  pleasant and cooperative in NAD Head:  Normocephalic and atraumatic. Respiratory:  Normal work of breathing. Cardiovascular:  RRR  Impression/Plan: Brandy Diaz is here for cataract surgery.  Risks, benefits, limitations, and alternatives regarding cataract surgery have been reviewed with the patient.  Questions have been answered.  All parties agreeable.   Birder Robson, MD  04/02/2021, 7:16 AM

## 2021-04-03 ENCOUNTER — Encounter: Payer: Self-pay | Admitting: Ophthalmology

## 2021-04-09 ENCOUNTER — Other Ambulatory Visit: Payer: Self-pay | Admitting: Internal Medicine

## 2021-04-09 DIAGNOSIS — N183 Chronic kidney disease, stage 3 unspecified: Secondary | ICD-10-CM

## 2021-04-09 DIAGNOSIS — E1122 Type 2 diabetes mellitus with diabetic chronic kidney disease: Secondary | ICD-10-CM

## 2021-04-09 NOTE — Telephone Encounter (Signed)
Requested Prescriptions  Pending Prescriptions Disp Refills  . Accu-Chek Softclix Lancets lancets [Pharmacy Med Name: LANCET   SOFTCLIX] 200 each 3    Sig: TEST TWICE DAILY     Endocrinology: Diabetes - Testing Supplies Passed - 04/09/2021  2:26 PM      Passed - Valid encounter within last 12 months    Recent Outpatient Visits          7 months ago Prediabetes   [facility] Glean Hess, MD   10 months ago Venous stasis dermatitis of both lower extremities   [facility] Glean Hess, MD   10 months ago Essential (primary) hypertension   [facility] Glean Hess, MD   11 months ago Annual physical exam   [facility] Glean Hess, MD   1 year ago Essential (primary) hypertension   [facility] Glean Hess, MD      Future Appointments            In 1 week Army Melia Jesse Sans, MD [facility], [facility]

## 2021-04-09 NOTE — Telephone Encounter (Signed)
  Notes to clinic:  Patient is requesting a script for the Aviva Plus meter Review for new script    Requested Prescriptions  Pending Prescriptions Disp Refills   Accu-Chek Softclix Lancets lancets 100 each 12    Sig: 1 each by Other route 2 (two) times daily. Use as instructed      Endocrinology: Diabetes - Testing Supplies Passed - 04/09/2021  2:19 PM      Passed - Valid encounter within last 12 months    Recent Outpatient Visits           7 months ago Prediabetes   [facility] Glean Hess, MD   10 months ago Venous stasis dermatitis of both lower extremities   [facility] Glean Hess, MD   10 months ago Essential (primary) hypertension   [facility] Glean Hess, MD   11 months ago Annual physical exam   [facility] Glean Hess, MD   1 year ago Essential (primary) hypertension   [facility] Glean Hess, MD       Future Appointments             In 1 week Army Melia Jesse Sans, MD [facility], [facility]

## 2021-04-09 NOTE — Telephone Encounter (Signed)
Copied from [place] [id number]. Topic: Quick Communication - Rx Refill/Question >> Apr 09, 2021  2:13 PM Leward Quan A wrote: Medication: Accu-Chek Softclix Lancets lancets, Accu-Chek Aviva Plus Meter   Has the patient contacted their pharmacy? Yes.   (Agent: If no, request that the patient contact the pharmacy for the refill.) (Agent: If yes, when and what did the pharmacy advise?)  Preferred Pharmacy (with phone number or street name): [facility], [place]  Phone:  [phone] Fax:  [phone]     Agent: Please be advised that RX refills may take up to 3 business days. We ask that you follow-up with your pharmacy.

## 2021-04-16 ENCOUNTER — Other Ambulatory Visit: Payer: Self-pay

## 2021-04-16 ENCOUNTER — Telehealth: Payer: Self-pay | Admitting: Internal Medicine

## 2021-04-16 MED ORDER — BLOOD GLUCOSE METER KIT: PACK | 0 refills | Status: DC

## 2021-04-16 NOTE — Telephone Encounter (Signed)
Copied from [place]. Topic: Medicare AWV >> Apr 16, 2021  9:46 AM Cher Nakai R wrote: Reason for CRM:  Left message for patient to call back and schedule Medicare Annual Wellness Visit (AWV) in office.   If unable to come into the office for AWV,  please offer to do virtually or by telephone.  Last AWV:   04/11/2020  Please schedule at anytime with [facility] Health Advisor.  40 minute appointment  Any questions, please contact me at [phone]

## 2021-04-16 NOTE — Telephone Encounter (Signed)
Pt stated she called and asked for an ACCU CHEK [address] meter to be sent to [facility] / please advise

## 2021-04-16 NOTE — Telephone Encounter (Signed)
E-scribed to patients pharmacy.

## 2021-04-21 NOTE — Progress Notes (Signed)
Date:  04/22/2021   Name:  Brandy Diaz   DOB:  02-20-1946   MRN:  [id number]   Chief Complaint: Annual Exam (Breast exam no pap / needs BS monitor) Brandy Diaz is a 75 y.o. female who presents today for her Complete Annual Exam. She feels well. She reports exercising house and yard work. She reports she is sleeping fairly well. Breast complaints none.  Mammogram: scheduled for november DEXA: osteopenia on previous scan: scheduled for november Pap smear: discontinued Colonoscopy: 2013  Immunization History  Administered Date(s) Administered   Fluad Quad(high Dose 65+) 08/21/2020   Influenza Nasal 08/12/2019   Influenza Split 08/24/2017   Influenza, High Dose Seasonal PF 08/18/2017, 08/16/2018, 08/12/2019   Influenza-Unspecified 09/26/2015, 08/24/2016, 08/24/2017   Moderna Sars-Covid-2 Vaccination 02/20/2020, 03/21/2020   PFIZER SARS-COV-2 Pediatric Vaccination 5-54yr 10/08/2020   Pneumococcal Conjugate-13 12/27/2014   Pneumococcal Polysaccharide-23 11/11/2012, 05/05/2013, 05/05/2013   Tdap 01/20/2013, 05/11/2013    Hypertension This is a chronic problem. The problem is controlled. Pertinent negatives include no chest pain, headaches, palpitations or shortness of breath. Past treatments include angiotensin blockers. The current treatment provides significant improvement.  Hyperlipidemia This is a chronic problem. The problem is controlled. Pertinent negatives include no chest pain or shortness of breath. Current antihyperlipidemic treatment includes statins. The current treatment provides significant improvement of lipids.  Diabetes She presents for her follow-up diabetic visit. Diabetes type: prediabetes. Pertinent negatives for hypoglycemia include no dizziness, headaches, nervousness/anxiousness or tremors. Pertinent negatives for diabetes include no chest pain, no fatigue, no polydipsia and no polyuria. Symptoms are stable.   Lab Results  Component Value Date    CREATININE 1.17 (H) 08/17/2020   BUN 17 08/17/2020   NA 138 08/17/2020   K 4.1 08/17/2020   CL 102 08/17/2020   CO2 28 08/17/2020   Lab Results  Component Value Date   CHOL 168 04/19/2020   HDL 80 04/19/2020   LDLCALC 76 04/19/2020   TRIG 61 04/19/2020   CHOLHDL 2.1 04/19/2020   Lab Results  Component Value Date   TSH 1.200 04/19/2020   Lab Results  Component Value Date   HGBA1C 6.0 (H) 04/19/2020   Lab Results  Component Value Date   WBC 5.5 08/17/2020   HGB 11.9 (L) 08/17/2020   HCT 34.4 (L) 08/17/2020   MCV 88.0 08/17/2020   PLT 319 08/17/2020   Lab Results  Component Value Date   ALT 12 08/17/2020   AST 21 08/17/2020   ALKPHOS 64 08/17/2020   BILITOT 0.5 08/17/2020     Review of Systems  Constitutional:  Negative for chills, fatigue and fever.  HENT:  Negative for congestion, hearing loss, tinnitus, trouble swallowing and voice change.   Eyes:  Negative for visual disturbance.  Respiratory:  Negative for cough, chest tightness, shortness of breath and wheezing.   Cardiovascular:  Negative for chest pain, palpitations and leg swelling.  Gastrointestinal:  Negative for abdominal pain, constipation, diarrhea and vomiting.  Endocrine: Negative for polydipsia and polyuria.  Genitourinary:  Negative for dysuria, frequency, genital sores, vaginal bleeding and vaginal discharge.  Musculoskeletal:  Negative for arthralgias, gait problem and joint swelling.  Skin:  Negative for color change and rash.  Neurological:  Negative for dizziness, tremors, light-headedness and headaches.  Hematological:  Negative for adenopathy. Does not bruise/bleed easily.  Psychiatric/Behavioral:  Negative for dysphoric mood and sleep disturbance. The patient is not nervous/anxious.    Patient Active Problem List   Diagnosis Date Noted  Osteopenia due to cancer therapy 04/22/2021   Mood disorder ([place]) 08/21/2020   Venous stasis dermatitis of both lower extremities 11/08/2019   Ductal  carcinoma in situ (DCIS) of left breast 05/21/2017   Localized edema 04/28/2017   Stage 3a chronic kidney disease ([place]) 01/01/2016   Essential (primary) hypertension 07/24/2015   Gastro-esophageal reflux disease without esophagitis 07/24/2015   Mixed hyperlipidemia 07/24/2015   Prediabetes 07/24/2015   Fibrocystic breast changes 07/24/2015   Anxiety 05/24/2012    Allergies  Allergen Reactions   Ace Inhibitors Other (See Comments)    Cough   Hydrochlorothiazide Other (See Comments)    Dizziness   Triamterene-Hctz Other (See Comments)    Appetite loss    Past Surgical History:  Procedure Laterality Date   ABDOMINAL HYSTERECTOMY  1980   BREAST BIOPSY Left 2018   DCIS   BREAST LUMPECTOMY Left 03/2017   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 WITH    BREAST LUMPECTOMY WITH NEEDLE LOCALIZATION Left 04/07/2017   Procedure: BREAST LUMPECTOMY WITH NEEDLE LOCALIZATION;  Surgeon: Vickie Epley, MD;  Location: [facility] ORS;  Service: General;  Laterality: Left;   BREAST SURGERY Left    Breast Biopsy   CATARACT EXTRACTION W/PHACO Right 06/12/2016   Procedure: CATARACT EXTRACTION PHACO AND INTRAOCULAR LENS PLACEMENT ([place]);  Surgeon: Eulogio Bear, MD;  Location: [facility] ORS;  Service: Ophthalmology;  Laterality: Right;  [place] 6.1 AP% 12.7 CDE .77 FLUID PACK LOT # [id number]   CATARACT EXTRACTION W/PHACO Left 04/02/2021   Procedure: CATARACT EXTRACTION PHACO AND INTRAOCULAR LENS PLACEMENT ([place]) LEFT MILOOP DIABETIC;  Surgeon: Birder Robson, MD;  Location: [place];  Service: Ophthalmology;  Laterality: Left;  Diabetic - diet controlled 52.81 03:47.0   COLONOSCOPY  08/02/2012   Dr. Donnella Sham - diverticuli otherwise normal   ESOPHAGOGASTRODUODENOSCOPY  08/02/2012   chronic gastritis   EYE SURGERY Right    Cataract Extraction with IOL   OOPHORECTOMY      Social History   Tobacco Use   Smoking status: Never   Smokeless tobacco: Never   Tobacco  comments:    smoking cessation materials not required  Vaping Use   Vaping Use: Never used  Substance Use Topics   Alcohol use: No    Alcohol/week: 0.0 standard drinks   Drug use: No     Medication list has been reviewed and updated.  Current Meds  Medication Sig   ACCU-CHEK AVIVA PLUS test strip TEST TWICE DAILY   Accu-Chek Softclix Lancets lancets TEST TWICE DAILY   anastrozole (ARIMIDEX) 1 MG tablet TAKE 1 TABLET BY MOUTH  DAILY   aspirin 81 MG chewable tablet Chew by mouth daily.   atorvastatin (LIPITOR) 10 MG tablet TAKE 1 TABLET BY MOUTH  DAILY   blood glucose meter kit and supplies Dispense based on patient and insurance preference. Use up to four times daily as needed. (FOR ICD-10 E11.9).   calcium carbonate (OSCAL) 1500 (600 Ca) MG TABS tablet Take by mouth 2 (two) times daily with a meal.   fluticasone (FLONASE) 50 MCG/ACT nasal spray Place 2 sprays into both nostrils daily.   irbesartan (AVAPRO) 75 MG tablet TAKE 1 TABLET BY MOUTH  DAILY   triamcinolone cream (KENALOG) 0.1 % APPLY 1 APPLICATION TOPICALLY TO LESIONS ON BOTH LOWER LEGS TWICE A DAY    PHQ 2/9 Scores 04/22/2021 06/04/2020 05/21/2020 04/19/2020  PHQ - 2 Score 0 0 0 0  PHQ- 9 Score 2 0 0 2  GAD 7 : Generalized Anxiety Score 04/22/2021 06/04/2020 05/21/2020 04/19/2020  Nervous, Anxious, on Edge 0 0 0 0  Control/stop worrying 0 0 0 0  Worry too much - different things 0 0 0 0  Trouble relaxing 0 0 0 0  Restless 0 0 0 0  Easily annoyed or irritable 0 1 0 0  Afraid - awful might happen 0 0 0 0  Total GAD 7 Score 0 1 0 0  Anxiety Difficulty - Not difficult at all - Not difficult at all    BP Readings from Last 3 Encounters:  04/22/21 118/76  04/02/21 (!) 151/83  03/01/21 139/67    Physical Exam Vitals and nursing note reviewed.  Constitutional:      General: She is not in acute distress.    Appearance: She is well-developed.  HENT:     Head: Normocephalic and atraumatic.     Right Ear: Tympanic  membrane and ear canal normal.     Left Ear: Tympanic membrane and ear canal normal.  Eyes:     General: No scleral icterus.       Right eye: No discharge.        Left eye: No discharge.     Conjunctiva/sclera: Conjunctivae normal.  Neck:     Thyroid: No thyromegaly.     Vascular: No carotid bruit.  Cardiovascular:     Rate and Rhythm: Normal rate and regular rhythm.     Pulses: Normal pulses.     Heart sounds: Normal heart sounds.  Pulmonary:     Effort: Pulmonary effort is normal. No respiratory distress.     Breath sounds: No wheezing.  Chest:  Breasts:    Right: No mass, nipple discharge, skin change or tenderness.     Left: No mass, nipple discharge, skin change or tenderness.  Abdominal:     General: Bowel sounds are normal.     Palpations: Abdomen is soft.     Tenderness: There is no abdominal tenderness.  Musculoskeletal:     Cervical back: Normal range of motion. No erythema.     Right lower leg: No edema.     Left lower leg: No edema.  Lymphadenopathy:     Cervical: No cervical adenopathy.  Skin:    General: Skin is warm and dry.     Findings: Rash present.     Comments: LEs - skin darkened, thickened without ulcerations or drainage below the mid tibia bilaterally  Neurological:     Mental Status: She is alert and oriented to person, place, and time.     Cranial Nerves: No cranial nerve deficit.     Sensory: No sensory deficit.     Deep Tendon Reflexes: Reflexes are normal and symmetric.  Psychiatric:        Attention and Perception: Attention normal.        Mood and Affect: Mood normal.    Wt Readings from Last 3 Encounters:  04/22/21 129 lb (58.5 kg)  04/02/21 130 lb 8 oz (59.2 kg)  03/01/21 129 lb 8 oz (58.7 kg)    BP 118/76   Pulse 80   Temp 98.7 F (37.1 C) (Oral)   Ht '5\' 7"'  (1.702 m)   Wt 129 lb (58.5 kg)   SpO2 96%   BMI 20.20 kg/m   Assessment and Plan: 1. Annual physical exam Normal exam; borderline underweight - diet discussed and  appears well balanced and healthy  2. Malignant neoplasm of left breast in female, estrogen receptor positive,  unspecified site of breast [facility]) Mammogram scheduled for November On Arimidex x 5 yrs Followed by Oncology  3. Essential (primary) hypertension Clinically stable exam with well controlled BP. Tolerating medications without side effects at this time. Pt to continue current regimen and low sodium diet; benefits of regular exercise as able discussed. - CBC with Differential/Platelet - TSH - POCT urinalysis dipstick  4. Stage 3a chronic kidney disease (HCC) Continue to monitor Avoid NSAIDS - Comprehensive metabolic panel  5. Mixed hyperlipidemia Tolerating statin medication without side effects at this time LDL is at goal of < 70 on current dose Continue same therapy without change at this time. - Lipid panel  6. Prediabetes Continue healthy diet, exercise Rx for a new glucometer is given - Hemoglobin A1c  7. Need for hepatitis C screening test - Hepatitis C antibody  8. Osteopenia due to cancer therapy DEXA scheduled Continue calcium and vitamin D - VITAMIN D 25 Hydroxy (Vit-D Deficiency, Fractures)  9. Ductal carcinoma in situ (DCIS) of left breast Followed by Oncology  10. Venous stasis dermatitis of both lower extremities Rash is more prominent, skin thickened which may be due to chronicity Recommend continue Kenalog and refer to Dermatology - triamcinolone cream (KENALOG) 0.1 %; Apply topically 2 (two) times daily.  Dispense: 30 g; Refill: 5 - Ambulatory referral to Dermatology  11. Colon cancer screening - Fecal occult blood, imunochemical  12. Mood disorder ([place]) Doing well at this time.   Partially dictated using Editor, commissioning. Any errors are unintentional.  Halina Maidens, MD [facility]  04/22/2021

## 2021-04-22 ENCOUNTER — Encounter: Payer: Self-pay | Admitting: Internal Medicine

## 2021-04-22 ENCOUNTER — Ambulatory Visit (INDEPENDENT_AMBULATORY_CARE_PROVIDER_SITE_OTHER): Payer: Medicare Other | Admitting: Internal Medicine

## 2021-04-22 ENCOUNTER — Telehealth: Payer: Self-pay | Admitting: Internal Medicine

## 2021-04-22 ENCOUNTER — Other Ambulatory Visit: Payer: Self-pay

## 2021-04-22 VITALS — BP 118/76 | HR 80 | Temp 98.7°F | Ht 67.0 in | Wt 129.0 lb

## 2021-04-22 DIAGNOSIS — Z0000 Encounter for general adult medical examination without abnormal findings: Secondary | ICD-10-CM

## 2021-04-22 DIAGNOSIS — Z170 Estrogen receptor positive status [ER+]: Secondary | ICD-10-CM | POA: Diagnosis not present

## 2021-04-22 DIAGNOSIS — I10 Essential (primary) hypertension: Secondary | ICD-10-CM | POA: Diagnosis not present

## 2021-04-22 DIAGNOSIS — D0512 Intraductal carcinoma in situ of left breast: Secondary | ICD-10-CM | POA: Diagnosis not present

## 2021-04-22 DIAGNOSIS — C50912 Malignant neoplasm of unspecified site of left female breast: Secondary | ICD-10-CM | POA: Diagnosis not present

## 2021-04-22 DIAGNOSIS — F39 Unspecified mood [affective] disorder: Secondary | ICD-10-CM

## 2021-04-22 DIAGNOSIS — Z1211 Encounter for screening for malignant neoplasm of colon: Secondary | ICD-10-CM

## 2021-04-22 DIAGNOSIS — R7303 Prediabetes: Secondary | ICD-10-CM

## 2021-04-22 DIAGNOSIS — E782 Mixed hyperlipidemia: Secondary | ICD-10-CM | POA: Diagnosis not present

## 2021-04-22 DIAGNOSIS — M8580 Other specified disorders of bone density and structure, unspecified site: Secondary | ICD-10-CM

## 2021-04-22 DIAGNOSIS — I872 Venous insufficiency (chronic) (peripheral): Secondary | ICD-10-CM | POA: Diagnosis not present

## 2021-04-22 DIAGNOSIS — Z1159 Encounter for screening for other viral diseases: Secondary | ICD-10-CM

## 2021-04-22 DIAGNOSIS — N1831 Chronic kidney disease, stage 3a: Secondary | ICD-10-CM | POA: Diagnosis not present

## 2021-04-22 LAB — POCT URINALYSIS DIPSTICK
Bilirubin, UA: NEGATIVE
Blood, UA: NEGATIVE
Glucose, UA: NEGATIVE
Ketones, UA: NEGATIVE
Leukocytes, UA: NEGATIVE
Nitrite, UA: NEGATIVE
Protein, UA: NEGATIVE
Spec Grav, UA: 1.01 (ref 1.010–1.025)
Urobilinogen, UA: 0.2 E.U./dL
pH, UA: 7 (ref 5.0–8.0)

## 2021-04-22 MED ORDER — TRIAMCINOLONE ACETONIDE 0.1 % EX CREA: TOPICAL_CREAM | Freq: Two times a day (BID) | CUTANEOUS | 5 refills | Status: DC

## 2021-04-22 MED ORDER — BLOOD GLUCOSE METER KIT: PACK | 0 refills | Status: AC

## 2021-04-22 NOTE — Telephone Encounter (Signed)
[place] let them know that pt is apply the cream the both legs. Verbalized understanding.  KP

## 2021-04-22 NOTE — Telephone Encounter (Signed)
[place] is calling and would like to know the area the patient will be applying the triamcinolone cream. Please call [place] at [phone]

## 2021-04-23 LAB — COMPREHENSIVE METABOLIC PANEL
ALT: 14 IU/L (ref 0–32)
AST: 19 IU/L (ref 0–40)
Albumin/Globulin Ratio: 1.3 (ref 1.2–2.2)
Albumin: 4.7 g/dL (ref 3.7–4.7)
Alkaline Phosphatase: 87 IU/L (ref 44–121)
BUN/Creatinine Ratio: 16 (ref 12–28)
BUN: 18 mg/dL (ref 8–27)
Bilirubin Total: 0.4 mg/dL (ref 0.0–1.2)
CO2: 22 mmol/L (ref 20–29)
Calcium: 10.1 mg/dL (ref 8.7–10.3)
Chloride: 99 mmol/L (ref 96–106)
Creatinine, Ser: 1.12 mg/dL — ABNORMAL HIGH (ref 0.57–1.00)
Globulin, Total: 3.5 g/dL (ref 1.5–4.5)
Glucose: 119 mg/dL — ABNORMAL HIGH (ref 65–99)
Potassium: 4.3 mmol/L (ref 3.5–5.2)
Sodium: 139 mmol/L (ref 134–144)
Total Protein: 8.2 g/dL (ref 6.0–8.5)
eGFR: 51 mL/min/{1.73_m2} — ABNORMAL LOW (ref >59)

## 2021-04-23 LAB — CBC WITH DIFFERENTIAL/PLATELET
Basophils Absolute: 0.1 10*3/uL (ref 0.0–0.2)
Basos: 2 %
EOS (ABSOLUTE): 0.2 10*3/uL (ref 0.0–0.4)
Eos: 4 %
Hematocrit: 39.4 % (ref 34.0–46.6)
Hemoglobin: 13 g/dL (ref 11.1–15.9)
Immature Grans (Abs): 0 10*3/uL (ref 0.0–0.1)
Immature Granulocytes: 0 %
Lymphocytes Absolute: 0.9 10*3/uL (ref 0.7–3.1)
Lymphs: 17 %
MCH: 30.1 pg (ref 26.6–33.0)
MCHC: 33 g/dL (ref 31.5–35.7)
MCV: 91 fL (ref 79–97)
Monocytes Absolute: 0.4 10*3/uL (ref 0.1–0.9)
Monocytes: 7 %
Neutrophils Absolute: 3.7 10*3/uL (ref 1.4–7.0)
Neutrophils: 70 %
Platelets: 289 10*3/uL (ref 150–450)
RBC: 4.32 x10E6/uL (ref 3.77–5.28)
RDW: 13.2 % (ref 11.7–15.4)
WBC: 5.2 10*3/uL (ref 3.4–10.8)

## 2021-04-23 LAB — HEMOGLOBIN A1C
Est. average glucose Bld gHb Est-mCnc: 128 mg/dL
Hgb A1c MFr Bld: 6.1 % — ABNORMAL HIGH (ref 4.8–5.6)

## 2021-04-23 LAB — LIPID PANEL
Chol/HDL Ratio: 2 ratio (ref 0.0–4.4)
Cholesterol, Total: 197 mg/dL (ref 100–199)
HDL: 100 mg/dL (ref >39)
LDL Chol Calc (NIH): 86 mg/dL (ref 0–99)
Triglycerides: 57 mg/dL (ref 0–149)
VLDL Cholesterol Cal: 11 mg/dL (ref 5–40)

## 2021-04-23 LAB — HEPATITIS C ANTIBODY: Hep C Virus Ab: <0.1 s/co ratio (ref 0.0–0.9)

## 2021-04-23 LAB — TSH: TSH: 1.22 u[IU]/mL (ref 0.450–4.500)

## 2021-04-23 LAB — VITAMIN D 25 HYDROXY (VIT D DEFICIENCY, FRACTURES): Vit D, 25-Hydroxy: 58.4 ng/mL (ref 30.0–100.0)

## 2021-05-02 ENCOUNTER — Telehealth: Payer: Self-pay | Admitting: Internal Medicine

## 2021-05-02 NOTE — Telephone Encounter (Signed)
Nicki with [facility] is calling in for assistance. She is requesting a new Rx for pt.    Accuchek guide test strips.      Pharmacy:  [place], [place] Phone:  [phone]  Fax:  [phone]

## 2021-05-03 ENCOUNTER — Other Ambulatory Visit: Payer: Self-pay

## 2021-05-03 DIAGNOSIS — N183 Chronic kidney disease, stage 3 unspecified: Secondary | ICD-10-CM

## 2021-05-03 DIAGNOSIS — I872 Venous insufficiency (chronic) (peripheral): Secondary | ICD-10-CM | POA: Diagnosis not present

## 2021-05-03 MED ORDER — ACCU-CHEK AVIVA PLUS VI STRP: ORAL_STRIP | 3 refills | Status: DC

## 2021-05-03 NOTE — Telephone Encounter (Signed)
Refill sent in.  KP 

## 2021-05-06 ENCOUNTER — Telehealth: Payer: Self-pay | Admitting: *Deleted

## 2021-05-06 ENCOUNTER — Ambulatory Visit (INDEPENDENT_AMBULATORY_CARE_PROVIDER_SITE_OTHER): Payer: Medicare Other

## 2021-05-06 ENCOUNTER — Other Ambulatory Visit: Payer: Self-pay

## 2021-05-06 VITALS — BP 128/68 | HR 85 | Temp 98.5°F | Resp 16 | Ht 67.0 in | Wt 129.2 lb

## 2021-05-06 DIAGNOSIS — Z0000 Encounter for general adult medical examination without abnormal findings: Secondary | ICD-10-CM | POA: Diagnosis not present

## 2021-05-06 DIAGNOSIS — Z599 Problem related to housing and economic circumstances, unspecified: Secondary | ICD-10-CM | POA: Diagnosis not present

## 2021-05-06 NOTE — Telephone Encounter (Signed)
   Telephone encounter was:  Successful.  05/06/2021 Name: Brandy Diaz MRN: [id number] DOB: [birth year]  Brandy Diaz is a 75 y.o. year old female who is a primary care patient of Army Melia Jesse Sans, MD . The community resource team was consulted for assistance with Financial Difficulties related to bills  Care guide performed the following interventions: Patient provided with information about care guide support team and interviewed to confirm resource needs.  Follow Up Plan:   Nothing intended at this  [place], Care Management  [phone] [address] , [place] [address] Email : [email or web address]

## 2021-05-06 NOTE — Progress Notes (Signed)
Subjective:   Brandy Diaz is a 75 y.o. female who presents for Medicare Annual (Subsequent) preventive examination.  Review of Systems     Cardiac Risk Factors include: advanced age (>5mn, >>102women);diabetes mellitus;dyslipidemia;hypertension     Objective:    Today's Vitals   05/06/21 1005  BP: 128/68  Pulse: 85  Resp: 16  Temp: 98.5 F (36.9 C)  TempSrc: Oral  SpO2: 99%  Weight: 129 lb 3.2 oz (58.6 kg)  Height: '5\' 7"'  (1.702 m)   Body mass index is 20.24 kg/m.  Advanced Directives 05/06/2021 04/02/2021 03/01/2021 08/17/2020 05/18/2020 11/15/2019 04/18/2019  Does Patient Have a Medical Advance Directive? No No No No No No No  Would patient like information on creating a medical advance directive? No - Patient declined No - Patient declined - - - No - Patient declined No - Patient declined    Current Medications (verified) Outpatient Encounter Medications as of 05/06/2021  Medication Sig   Accu-Chek Softclix Lancets lancets TEST TWICE DAILY   anastrozole (ARIMIDEX) 1 MG tablet TAKE 1 TABLET BY MOUTH  DAILY   aspirin 81 MG chewable tablet Chew by mouth daily.   atorvastatin (LIPITOR) 10 MG tablet TAKE 1 TABLET BY MOUTH  DAILY   blood glucose meter kit and supplies Dispense based on patient and insurance preference. Use up to four times daily as needed. (FOR ICD-10 E11.9).   Calcium Carb-Cholecalciferol (CALCIUM 500 + D) 500-125 MG-UNIT TABS Take 2 tablets by mouth daily.   docusate sodium (COLACE) 100 MG capsule Take 100 mg by mouth daily as needed for mild constipation.   fluocinonide ointment (LIDEX) 04.03% Apply 1 application topically 2 (two) times daily.   fluticasone (FLONASE) 50 MCG/ACT nasal spray Place 2 sprays into both nostrils daily.   glucose blood (ACCU-CHEK AVIVA PLUS) test strip TEST TWICE DAILY   irbesartan (AVAPRO) 75 MG tablet TAKE 1 TABLET BY MOUTH  DAILY   triamcinolone cream (KENALOG) 0.1 % Apply topically 2 (two) times daily.   [DISCONTINUED] calcium  carbonate (OSCAL) 1500 (600 Ca) MG TABS tablet Take by mouth 2 (two) times daily with a meal.   No facility-administered encounter medications on file as of 05/06/2021.    Allergies (verified) Ace inhibitors, Hydrochlorothiazide, and Triamterene-hctz   History: Past Medical History:  Diagnosis Date   Anemia    Breast cancer ([place]   Left   Diabetes mellitus without complication (HCC)    diet controlled   Edema    ankles   GERD (gastroesophageal reflux disease)    Hypertension    Mood disorder ([place] 08/21[place]2020   Unexplained weight loss 07/24/2015   EGD and Colonoscopy normal 2014    Wears dentures    Full lower, partial upper   Past Surgical History:  Procedure Laterality Date   ABDOMINAL HYSTERECTOMY  1980   BREAST BIOPSY Left 2018   DCIS   BREAST LUMPECTOMY Left 03/2017   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 WITH    BREAST LUMPECTOMY WITH NEEDLE LOCALIZATION Left 04/07/2017   Procedure: BREAST LUMPECTOMY WITH NEEDLE LOCALIZATION;  Surgeon: DVickie Epley MD;  Location: [facility] ORS;  Service: General;  Laterality: Left;   BREAST SURGERY Left    Breast Biopsy   CATARACT EXTRACTION W/PHACO Right 06/12/2016   Procedure: CATARACT EXTRACTION PHACO AND INTRAOCULAR LENS PLACEMENT ([place];  Surgeon: BEulogio Bear MD;  Location: [facility] OR[facility];  Service: Ophthalmology;  Laterality: Right;  [place]6.1 AP% 12.7 CDE .77 FLUID  PACK LOT # [id number]   CATARACT EXTRACTION W/PHACO Left 04/02/2021   Procedure: CATARACT EXTRACTION PHACO AND INTRAOCULAR LENS PLACEMENT ([place]) LEFT MILOOP DIABETIC;  Surgeon: Birder Robson, MD;  Location: [facility];  Service: Ophthalmology;  Laterality: Left;  Diabetic - diet controlled 52.81 03:47.0   COLONOSCOPY  08/02/2012   Dr. Donnella Sham - diverticuli otherwise normal   ESOPHAGOGASTRODUODENOSCOPY  08/02/2012   chronic gastritis   EYE SURGERY Right    Cataract Extraction with IOL   OOPHORECTOMY     Family History   Problem Relation Age of Onset   Breast cancer Mother 19   Diabetes Mother    Hypertension Mother    CAD Father    Breast cancer Sister    Hypertension Sister    Breast cancer Maternal Aunt 39   Throat cancer Brother    Social History   Socioeconomic History   Marital status: Married    Spouse name: Not on file   Number of children: 0   Years of education: Not on file   Highest education level: 10th grade  Occupational History   Occupation: Retired  Tobacco Use   Smoking status: Never   Smokeless tobacco: Never   Tobacco comments:    smoking cessation materials not required  Vaping Use   Vaping Use: Never used  Substance and Sexual Activity   Alcohol use: No    Alcohol/week: 0.0 standard drinks   Drug use: No   Sexual activity: Not Currently  Other Topics Concern   Not on file  Social History Narrative   Not on file   Social Determinants of Health   Financial Resource Strain: Medium Risk   Difficulty of Paying Living Expenses: Somewhat hard  Food Insecurity: No Food Insecurity   Worried About Charity fundraiser in the Last Year: Never true   [place] in the Last Year: Never true  Transportation Needs: No Transportation Needs   Lack of Transportation (Medical): No   Lack of Transportation (Non-Medical): No  Physical Activity: Inactive   Days of Exercise per Week: 0 days   Minutes of Exercise per Session: 0 min  Stress: No Stress Concern Present   Feeling of Stress : Not at all  Social Connections: Moderately Integrated   Frequency of Communication with Friends and Family: More than three times a week   Frequency of Social Gatherings with Friends and Family: Twice a week   Attends Religious Services: More than 4 times per year   Active Member of [employer] or Organizations: No   Attends Music therapist: Never   Marital Status: Married    Tobacco Counseling Counseling given: No Tobacco comments: smoking cessation materials not  required   Clinical Intake:  Pre-visit preparation completed: Yes  Pain : No/denies pain     BMI - recorded: 20.24 Nutritional Status: BMI of 19-24  Normal Nutritional Risks: None Diabetes: Yes (prediabetes) CBG done?: No Did pt. bring in CBG monitor from home?: No  How often do you need to have someone help you when you read instructions, pamphlets, or other written materials from your doctor or pharmacy?: 1 - Never  Nutrition Risk Assessment:  Has the patient had any N/V/D within the last 2 months?  No  Does the patient have any non-healing wounds?  No  Has the patient had any unintentional weight loss or weight gain?  No   Diabetes:  Is the patient diabetic?  Yes  If diabetic, was a CBG obtained today?  No  Did the patient bring in their glucometer from home?  No  How often do you monitor your CBG's? Twice daily 3 days per week .   Financial Strains and Diabetes Management:  Are you having any financial strains with the device, your supplies or your medication? No .  Does the patient want to be seen by Chronic Care Management for management of their diabetes?  No  Would the patient like to be referred to a Nutritionist or for Diabetic Management?  No   Diabetic Exams:  Diabetic Eye Exam: Completed 02/25/21 negative retinopathy.   Diabetic Foot Exam: Completed 04/19/20.   Interpreter Needed?: No  Information entered by :: Clemetine Marker LPN   Activities of Daily Living In your present state of health, do you have any difficulty performing the following activities: 05/06/2021 04/22/2021  Hearing? Y Y  Comment declines hearing aids -  Vision? N N  Difficulty concentrating or making decisions? N N  Walking or climbing stairs? N N  Dressing or bathing? N N  Doing errands, shopping? N N  Preparing Food and eating ? N -  Using the Toilet? N -  In the past six months, have you accidently leaked urine? N -  Do you have problems with loss of bowel control? N -   Managing your Medications? N -  Managing your Finances? N -  Housekeeping or managing your Housekeeping? N -  Some recent data might be hidden    Patient Care Team: Glean Hess, MD as PCP - General (Internal Medicine) Sindy Guadeloupe, MD as Consulting Physician (Oncology) Eulogio Bear, MD as Consulting Physician (Ophthalmology) Rory Percy, MD as Referring Physician (Dermatology)  Indicate any recent Medical Services you may have received from other than [facility] providers in the past year (date may be approximate).     Assessment:   This is a routine wellness examination for Brandy Diaz.  Hearing/Vision screen Hearing Screening - Comments:: Pt has mild hearing difficulty in left ear Vision Screening - Comments:: [facility]   Dietary issues and exercise activities discussed: Current Exercise Habits: The patient does not participate in regular exercise at present, Exercise limited by: None identified   Goals Addressed             This Visit's Progress    DIET - INCREASE WATER INTAKE   On track    Recommend to drink at least 6-8 8oz glasses of water per day.        Depression Screen PHQ 2/9 Scores 05/06/2021 04/22/2021 06/04/2020 05/21/2020 04/19/2020 04/11/2020 06/01/2019  PHQ - 2 Score 0 0 0 0 0 0 0  PHQ- 9 Score - 2 0 0 2 - 2    Fall Risk Fall Risk  05/06/2021 04/22/2021 06/04/2020 05/21/2020 04/19/2020  Falls in the past year? 0 0 0 0 0  Number falls in past yr: 0 - - 0 0  Injury with Fall? 0 - - 0 0  Risk for fall due to : No Fall Risks - No Fall Risks - No Fall Risks  Risk for fall due to: Comment - - - - -  Follow up Falls prevention discussed Falls evaluation completed Falls evaluation completed Falls evaluation completed Falls evaluation completed    [place]:  Any stairs in or around the home? Yes  If so, are there any without handrails? No  Home free of loose throw rugs in  walkways,  pet beds, electrical cords, etc? Yes  Adequate lighting in your home to reduce risk of falls? Yes   ASSISTIVE DEVICES UTILIZED TO PREVENT FALLS:  Life alert? No  Use of a cane, walker or w/c? No  Grab bars in the bathroom? No  Shower chair or bench in shower? No  Elevated toilet seat or a handicapped toilet? No   TIMED UP AND GO:  Was the test performed? Yes .  Length of time to ambulate 10 feet: 5 sec.   Gait steady and fast without use of assistive device  Cognitive Function:     6CIT Screen 04/11/2020 04/11/2019 04/07/2018 01/19/2017  What Year? 0 points 0 points 0 points 0 points  What month? 0 points 0 points 0 points 0 points  What time? 0 points 0 points 0 points 0 points  Count back from 20 0 points 0 points 0 points 0 points  Months in reverse 0 points 0 points 0 points 2 points  Repeat phrase 4 points 6 points 4 points 0 points  Total Score '4 6 4 2    ' Immunizations Immunization History  Administered Date(s) Administered   Fluad Quad(high Dose 65+) 08/21/2020   Influenza Nasal 08/12/2019   Influenza Split 08/24/2017   Influenza, High Dose Seasonal PF 08/18/2017, 08/16/2018, 08/12/2019   Influenza-Unspecified 09/26/2015, 08/24/2016, 08/24/2017   Moderna Sars-Covid-2 Vaccination 02/20/2020, 03/21/2020   PFIZER SARS-COV-2 Pediatric Vaccination 5-58yr 10/08/2020   Pneumococcal Conjugate-13 12/27/2014   Pneumococcal Polysaccharide-23 11/11/2012, 05/05/2013, 05/05/2013   Tdap 01/20/2013, 05/11/2013    TDAP status: Up to date  Flu Vaccine status: Up to date  Pneumococcal vaccine status: Up to date  Covid-19 vaccine status: Completed vaccines  Qualifies for Shingles Vaccine? Yes   Zostavax completed No   Shingrix Completed?: No.    Education has been provided regarding the importance of this vaccine. Patient has been advised to call insurance company to determine out of pocket expense if they have not yet received this vaccine. Advised may also receive  vaccine at local pharmacy or Health Dept. Verbalized acceptance and understanding.  Screening Tests Health Maintenance  Topic Date Due   Zoster Vaccines- Shingrix (1 of 2) Never done   COVID-19 Vaccine (3 - Moderna risk series) 11/05/2020   INFLUENZA VACCINE  06/10/2021   MAMMOGRAM  09/26/2021   TETANUS/TDAP  05/12/2023   DEXA SCAN  Completed   Hepatitis C Screening  Completed   PNA vac Low Risk Adult  Completed   HPV VACCINES  Aged Out    Health Maintenance  Health Maintenance Due  Topic Date Due   Zoster Vaccines- Shingrix (1 of 2) Never done   COVID-19 Vaccine (3 - Moderna risk series) 11/05/2020    Colorectal cancer screening: Type of screening: FOBT/FIT. Completed 08/18/18. Repeat every 1 years. Pt completed 04/2021 awaiting results.   Mammogram status: Completed 09/26/20. Repeat every year. Scheduled for 09/30/21  Bone Density status: Completed 09/26/19. Results reflect: Bone density results: OSTEOPENIA. Repeat every 2 years. Scheduled for 09/30/21   Lung Cancer Screening: (Low Dose CT Chest recommended if Age 738-80years, 30 pack-year currently smoking OR have quit w/in 15years.) does not qualify.   Additional Screening:  Hepatitis C Screening: does qualify; Completed 04/22/21  Vision Screening: Recommended annual ophthalmology exams for early detection of glaucoma and other disorders of the eye. Is the patient up to date with their annual eye exam?  Yes  Who is the provider or what is the name of the office in which the  patient attends annual eye exams? [facility] Screening: Recommended annual dental exams for proper oral hygiene  Community Resource Referral / Chronic Care Management: CRR required this visit?  Yes   CCM required this visit?  No      Plan:     I have personally reviewed and noted the following in the patient's chart:   Medical and social history Use of alcohol, tobacco or illicit drugs  Current medications and supplements  including opioid prescriptions.  Functional ability and status Nutritional status Physical activity Advanced directives List of other physicians Hospitalizations, surgeries, and ER visits in previous 12 months Vitals Screenings to include cognitive, depression, and falls Referrals and appointments  In addition, I have reviewed and discussed with patient certain preventive protocols, quality metrics, and best practice recommendations. A written personalized care plan for preventive services as well as general preventive health recommendations were provided to patient.     Clemetine Marker, LPN   [id number]   Nurse Notes: none

## 2021-05-06 NOTE — Patient Instructions (Signed)
Brandy Diaz , Thank you for taking time to come for your Medicare Wellness Visit. I appreciate your ongoing commitment to your health goals. Please review the following plan we discussed and let me know if I can assist you in the future.   Screening recommendations/referrals: Colonoscopy: FOBT done 04/2021; awaiting results Mammogram: done 09/26/20. Scheduled for 09/30/21 Bone Density: done 09/26/19. Scheduled for 09/30/21 Recommended yearly ophthalmology/optometry visit for glaucoma screening and checkup Recommended yearly dental visit for hygiene and checkup  Vaccinations: Influenza vaccine: done 08/21/20 Pneumococcal vaccine: done 12/27/14 Tdap vaccine: done 05/11/13 Shingles vaccine: Shingrix discussed. Please contact your pharmacy for coverage information.  Covid-19: done 02/20/20, 03/21/20 & 10/08/20  Advanced directives: Advance directive discussed with you today. Even though you declined this today please call our office should you change your mind and we can give you the proper paperwork for you to fill out.   Conditions/risks identified: Recommend increasing physical activity   Next appointment: Follow up in one year for your annual wellness visit    Preventive Care 65 Years and Older, Female Preventive care refers to lifestyle choices and visits with your health care provider that can promote health and wellness. What does preventive care include? A yearly physical exam. This is also called an annual well check. Dental exams once or twice a year. Routine eye exams. Ask your health care provider how often you should have your eyes checked. Personal lifestyle choices, including: Daily care of your teeth and gums. Regular physical activity. Eating a healthy diet. Avoiding tobacco and drug use. Limiting alcohol use. Practicing safe sex. Taking low-dose aspirin every day. Taking vitamin and mineral supplements as recommended by your health care provider. What happens during  an annual well check? The services and screenings done by your health care provider during your annual well check will depend on your age, overall health, lifestyle risk factors, and family history of disease. Counseling  Your health care provider may ask you questions about your: Alcohol use. Tobacco use. Drug use. Emotional well-being. Home and relationship well-being. Sexual activity. Eating habits. History of falls. Memory and ability to understand (cognition). Work and work Statistician. Reproductive health. Screening  You may have the following tests or measurements: Height, weight, and BMI. Blood pressure. Lipid and cholesterol levels. These may be checked every 5 years, or more frequently if you are over 69 years old. Skin check. Lung cancer screening. You may have this screening every year starting at age 44 if you have a 30-pack-year history of smoking and currently smoke or have quit within the past 15 years. Fecal occult blood test (FOBT) of the stool. You may have this test every year starting at age 26. Flexible sigmoidoscopy or colonoscopy. You may have a sigmoidoscopy every 5 years or a colonoscopy every 10 years starting at age 64. Hepatitis C blood test. Hepatitis B blood test. Sexually transmitted disease (STD) testing. Diabetes screening. This is done by checking your blood sugar (glucose) after you have not eaten for a while (fasting). You may have this done every 1-3 years. Bone density scan. This is done to screen for osteoporosis. You may have this done starting at age 51. Mammogram. This may be done every 1-2 years. Talk to your health care provider about how often you should have regular mammograms. Talk with your health care provider about your test results, treatment options, and if necessary, the need for more tests. Vaccines  Your health care provider may recommend certain vaccines, such as: Influenza vaccine. This  is recommended every year. Tetanus,  diphtheria, and acellular pertussis (Tdap, Td) vaccine. You may need a Td booster every 10 years. Zoster vaccine. You may need this after age 54. Pneumococcal 13-valent conjugate (PCV13) vaccine. One dose is recommended after age 76. Pneumococcal polysaccharide (PPSV23) vaccine. One dose is recommended after age 5. Talk to your health care provider about which screenings and vaccines you need and how often you need them. This information is not intended to replace advice given to you by your health care provider. Make sure you discuss any questions you have with your health care provider. Document Released: 11/23/2015 Document Revised: 07/16/2016 Document Reviewed: 08/28/2015 Elsevier Interactive Patient Education  2017 [place] Prevention in the Home Falls can cause injuries. They can happen to people of all ages. There are many things you can do to make your home safe and to help prevent falls. What can I do on the outside of my home? Regularly fix the edges of walkways and driveways and fix any cracks. Remove anything that might make you trip as you walk through a door, such as a raised step or threshold. Trim any bushes or trees on the path to your home. Use bright outdoor lighting. Clear any walking paths of anything that might make someone trip, such as rocks or tools. Regularly check to see if handrails are loose or broken. Make sure that both sides of any steps have handrails. Any raised decks and porches should have guardrails on the edges. Have any leaves, snow, or ice cleared regularly. Use sand or salt on walking paths during winter. Clean up any spills in your garage right away. This includes oil or grease spills. What can I do in the bathroom? Use night lights. Install grab bars by the toilet and in the tub and shower. Do not use towel bars as grab bars. Use non-skid mats or decals in the tub or shower. If you need to sit down in the shower, use a plastic,  non-slip stool. Keep the floor dry. Clean up any water that spills on the floor as soon as it happens. Remove soap buildup in the tub or shower regularly. Attach bath mats securely with double-sided non-slip rug tape. Do not have throw rugs and other things on the floor that can make you trip. What can I do in the bedroom? Use night lights. Make sure that you have a light by your bed that is easy to reach. Do not use any sheets or blankets that are too big for your bed. They should not hang down onto the floor. Have a firm chair that has side arms. You can use this for support while you get dressed. Do not have throw rugs and other things on the floor that can make you trip. What can I do in the kitchen? Clean up any spills right away. Avoid walking on wet floors. Keep items that you use a lot in easy-to-reach places. If you need to reach something above you, use a strong step stool that has a grab bar. Keep electrical cords out of the way. Do not use floor polish or wax that makes floors slippery. If you must use wax, use non-skid floor wax. Do not have throw rugs and other things on the floor that can make you trip. What can I do with my stairs? Do not leave any items on the stairs. Make sure that there are handrails on both sides of the stairs and use them. Fix handrails that  are broken or loose. Make sure that handrails are as long as the stairways. Check any carpeting to make sure that it is firmly attached to the stairs. Fix any carpet that is loose or worn. Avoid having throw rugs at the top or bottom of the stairs. If you do have throw rugs, attach them to the floor with carpet tape. Make sure that you have a light switch at the top of the stairs and the bottom of the stairs. If you do not have them, ask someone to add them for you. What else can I do to help prevent falls? Wear shoes that: Do not have high heels. Have rubber bottoms. Are comfortable and fit you well. Are closed  at the toe. Do not wear sandals. If you use a stepladder: Make sure that it is fully opened. Do not climb a closed stepladder. Make sure that both sides of the stepladder are locked into place. Ask someone to hold it for you, if possible. Clearly mark and make sure that you can see: Any grab bars or handrails. First and last steps. Where the edge of each step is. Use tools that help you move around (mobility aids) if they are needed. These include: Canes. Walkers. Scooters. Crutches. Turn on the lights when you go into a dark area. Replace any light bulbs as soon as they burn out. Set up your furniture so you have a clear path. Avoid moving your furniture around. If any of your floors are uneven, fix them. If there are any pets around you, be aware of where they are. Review your medicines with your doctor. Some medicines can make you feel dizzy. This can increase your chance of falling. Ask your doctor what other things that you can do to help prevent falls. This information is not intended to replace advice given to you by your health care provider. Make sure you discuss any questions you have with your health care provider. Document Released: 08/23/2009 Document Revised: 04/03/2016 Document Reviewed: 12/01/2014 Elsevier Interactive Patient Education  2017 [employer].

## 2021-05-23 ENCOUNTER — Other Ambulatory Visit: Payer: Self-pay

## 2021-05-23 DIAGNOSIS — Z1211 Encounter for screening for malignant neoplasm of colon: Secondary | ICD-10-CM

## 2021-05-23 LAB — FECAL OCCULT BLOOD, IMMUNOCHEMICAL

## 2021-05-29 ENCOUNTER — Other Ambulatory Visit: Payer: Self-pay | Admitting: Internal Medicine

## 2021-05-29 DIAGNOSIS — N183 Chronic kidney disease, stage 3 unspecified: Secondary | ICD-10-CM

## 2021-05-29 DIAGNOSIS — I10 Essential (primary) hypertension: Secondary | ICD-10-CM

## 2021-05-29 DIAGNOSIS — E1122 Type 2 diabetes mellitus with diabetic chronic kidney disease: Secondary | ICD-10-CM

## 2021-05-29 NOTE — Telephone Encounter (Signed)
Requested Prescriptions  Pending Prescriptions Disp Refills  . ACCU-CHEK AVIVA PLUS test strip [Pharmacy Med Name: Accu-Chek Aviva Plus In Vitro Strip] 200 strip 3    Sig: TEST TWICE DAILY     Endocrinology: Diabetes - Testing Supplies Passed - 05/29/2021 12:33 AM      Passed - Valid encounter within last 12 months    Recent Outpatient Visits          1 month ago Annual physical exam   [facility] Glean Hess, MD   9 months ago Prediabetes   [facility] Glean Hess, MD   11 months ago Venous stasis dermatitis of both lower extremities   [facility] Glean Hess, MD   1 year ago Essential (primary) hypertension   [facility] Glean Hess, MD   1 year ago Annual physical exam   [facility] Glean Hess, MD      Future Appointments            In 4 months Army Melia Jesse Sans, MD [facility], [place]   In 11 months Army Melia Jesse Sans, MD [facility], [place]           . irbesartan (AVAPRO) 75 MG tablet [Pharmacy Med Name: IRBESARTAN  75MG   TAB] 90 tablet 1    Sig: TAKE 1 TABLET BY MOUTH  DAILY     Cardiovascular:  Angiotensin Receptor Blockers Failed - 05/29/2021 12:33 AM      Failed - Cr in normal range and within 180 days    Creatinine  Date Value Ref Range Status  02/22/2012 1.11 0.60 - 1.30 mg/dL Final   Creatinine, Ser  Date Value Ref Range Status  04/22/2021 1.12 (H) 0.57 - 1.00 mg/dL Final         Passed - K in normal range and within 180 days    Potassium  Date Value Ref Range Status  04/22/2021 4.3 3.5 - 5.2 mmol/L Final  02/22/2012 4.0 3.5 - 5.1 mmol/L Final         Passed - Patient is not pregnant      Passed - Last BP in normal range    BP Readings from Last 1 Encounters:  05/06/21 128/68         Passed - Valid encounter within last 6 months    Recent Outpatient Visits          1 month ago Annual physical exam   [facility] Glean Hess, MD   9  months ago Prediabetes   [facility] Glean Hess, MD   11 months ago Venous stasis dermatitis of both lower extremities   [facility] Glean Hess, MD   1 year ago Essential (primary) hypertension   [facility] Glean Hess, MD   1 year ago Annual physical exam   [facility] Glean Hess, MD      Future Appointments            In 4 months Army Melia Jesse Sans, MD [facility], [place]   In 11 months Army Melia Jesse Sans, MD [facility], [facility]

## 2021-05-31 DIAGNOSIS — Z1211 Encounter for screening for malignant neoplasm of colon: Secondary | ICD-10-CM | POA: Diagnosis not present

## 2021-06-01 ENCOUNTER — Other Ambulatory Visit: Payer: Self-pay | Admitting: Oncology

## 2021-06-04 ENCOUNTER — Telehealth: Payer: Self-pay

## 2021-06-04 NOTE — Telephone Encounter (Signed)
Called pt as a reminder to her FIT test. Pt stated she completed and turn it in Friday 05/31/21.  KP

## 2021-06-06 LAB — FECAL OCCULT BLOOD, IMMUNOCHEMICAL: Fecal Occult Bld: NEGATIVE

## 2021-06-14 DIAGNOSIS — I872 Venous insufficiency (chronic) (peripheral): Secondary | ICD-10-CM | POA: Diagnosis not present

## 2021-08-27 ENCOUNTER — Other Ambulatory Visit: Payer: Self-pay | Admitting: Internal Medicine

## 2021-08-27 DIAGNOSIS — E782 Mixed hyperlipidemia: Secondary | ICD-10-CM

## 2021-08-28 NOTE — Telephone Encounter (Signed)
Requested Prescriptions  Pending Prescriptions Disp Refills  . atorvastatin (LIPITOR) 10 MG tablet [Pharmacy Med Name: Atorvastatin Calcium 10 MG Oral Tablet] 90 tablet 2    Sig: TAKE 1 TABLET BY MOUTH  DAILY     Cardiovascular:  Antilipid - Statins Passed - 08/27/2021 10:16 PM      Passed - Total Cholesterol in normal range and within 360 days    Cholesterol, Total  Date Value Ref Range Status  04/22/2021 197 100 - 199 mg/dL Final   Cholesterol  Date Value Ref Range Status  02/14/2012 218 (H) 0 - 200 mg/dL Final         Passed - LDL in normal range and within 360 days    Ldl Cholesterol, Calc  Date Value Ref Range Status  02/14/2012 118 (H) 0 - 100 mg/dL Final   LDL Chol Calc (NIH)  Date Value Ref Range Status  04/22/2021 86 0 - 99 mg/dL Final         Passed - HDL in normal range and within 360 days    HDL Cholesterol  Date Value Ref Range Status  02/14/2012 87 (H) 40 - 60 mg/dL Final   HDL  Date Value Ref Range Status  04/22/2021 100 >39 mg/dL Final         Passed - Triglycerides in normal range and within 360 days    Triglycerides  Date Value Ref Range Status  04/22/2021 57 0 - 149 mg/dL Final  02/14/2012 64 0 - 200 mg/dL Final         Passed - Patient is not pregnant      Passed - Valid encounter within last 12 months    Recent Outpatient Visits          4 months ago Annual physical exam   [facility] Glean Hess, MD   1 year ago Prediabetes   [facility] Glean Hess, MD   1 year ago Venous stasis dermatitis of both lower extremities   [facility] Glean Hess, MD   1 year ago Essential (primary) hypertension   [facility] Glean Hess, MD   1 year ago Annual physical exam   [facility] Glean Hess, MD      Future Appointments            In 1 month Army Melia Jesse Sans, MD [facility], [place]   In 7 months Army Melia, Jesse Sans, MD [facility], [facility]

## 2021-08-30 ENCOUNTER — Inpatient Hospital Stay: Payer: Medicare Other | Attending: Oncology | Admitting: Oncology

## 2021-08-30 ENCOUNTER — Other Ambulatory Visit: Payer: Self-pay

## 2021-08-30 ENCOUNTER — Encounter: Payer: Self-pay | Admitting: Oncology

## 2021-08-30 VITALS — BP 153/77 | HR 81 | Temp 97.2°F | Resp 16 | Wt 131.0 lb

## 2021-08-30 DIAGNOSIS — Z803 Family history of malignant neoplasm of breast: Secondary | ICD-10-CM | POA: Diagnosis not present

## 2021-08-30 DIAGNOSIS — I10 Essential (primary) hypertension: Secondary | ICD-10-CM | POA: Insufficient documentation

## 2021-08-30 DIAGNOSIS — Z833 Family history of diabetes mellitus: Secondary | ICD-10-CM | POA: Insufficient documentation

## 2021-08-30 DIAGNOSIS — M7989 Other specified soft tissue disorders: Secondary | ICD-10-CM | POA: Insufficient documentation

## 2021-08-30 DIAGNOSIS — Z90721 Acquired absence of ovaries, unilateral: Secondary | ICD-10-CM | POA: Diagnosis not present

## 2021-08-30 DIAGNOSIS — Z08 Encounter for follow-up examination after completed treatment for malignant neoplasm: Secondary | ICD-10-CM

## 2021-08-30 DIAGNOSIS — Z86000 Personal history of in-situ neoplasm of breast: Secondary | ICD-10-CM

## 2021-08-30 DIAGNOSIS — D0512 Intraductal carcinoma in situ of left breast: Secondary | ICD-10-CM | POA: Insufficient documentation

## 2021-08-30 DIAGNOSIS — Z79899 Other long term (current) drug therapy: Secondary | ICD-10-CM | POA: Insufficient documentation

## 2021-08-30 DIAGNOSIS — Z808 Family history of malignant neoplasm of other organs or systems: Secondary | ICD-10-CM | POA: Diagnosis not present

## 2021-08-30 DIAGNOSIS — Z79811 Long term (current) use of aromatase inhibitors: Secondary | ICD-10-CM | POA: Insufficient documentation

## 2021-08-30 DIAGNOSIS — Z8249 Family history of ischemic heart disease and other diseases of the circulatory system: Secondary | ICD-10-CM | POA: Diagnosis not present

## 2021-08-30 DIAGNOSIS — Z5181 Encounter for therapeutic drug level monitoring: Secondary | ICD-10-CM

## 2021-08-30 NOTE — Progress Notes (Signed)
Pt will be up from 4am without being able to go to back to sleep. Does not take any nighttime meds.

## 2021-09-08 NOTE — Progress Notes (Signed)
Hematology/Oncology Consult note [facility]  [phone] [phone]  Patient Care Team: Glean Hess, MD as PCP - General (Internal Medicine) Sindy Guadeloupe, MD as Consulting Physician (Oncology) Eulogio Bear, MD as Consulting Physician (Ophthalmology) Rory Percy, MD as Referring Physician (Dermatology)   Name of the patient: Brandy Diaz  [id number]  1946/05/15   Date of visit: 09/08/21  Diagnosis- left breast ER+ DCIS  Chief complaint/ Reason for visit-routine follow-up of DCIS  Heme/Onc history: Patient is a 75 year old female with a past medical history significant for hypertension and diet-controlled diabetes who underwent screening mammogram which showed an abnormal mass in the left breast 3 cm from the nipple 1 x 0.9 x 0.3 cm in size and mobile mass at 4:00 position 0.8 x 0.7 x 0.5 cm in size. Evaluation of axilla was negative for adenopathy. This was followed by ultrasound and the mass at the 5:30 position was stable long-term and consistent with benign lesion and core biopsy of the 4:00 position mass was recommended.   2. 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    3. 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 No necrosis was identified. Margins were negative for DCIS 8 mm from the deep margin. ER 5190% positive PR negative   4. Bone density scan in June 2018 showed T score of -2.2 consistent with osteopenia. 10 year probability of a major osteoporotic fracture was 6% and hip fracture was 1.5%. Patient started on arimidex in June 2018  Interval history-patient is tolerating Arimidex well.  Denies any specific complaints due to the medication.  Denies any breast concerns.  She does have on and off bilateral leg swelling for which she wears compression  stockings.  ECOG PS- 1 Pain scale- 0   Review of systems- Review of Systems  Constitutional:  Negative for chills, fever, malaise/fatigue and weight loss.  HENT:  Negative for congestion, ear discharge and nosebleeds.   Eyes:  Negative for blurred vision.  Respiratory:  Negative for cough, hemoptysis, sputum production, shortness of breath and wheezing.   Cardiovascular:  Positive for leg swelling. Negative for chest pain, palpitations, orthopnea and claudication.  Gastrointestinal:  Negative for abdominal pain, blood in stool, constipation, diarrhea, heartburn, melena, nausea and vomiting.  Genitourinary:  Negative for dysuria, flank pain, frequency, hematuria and urgency.  Musculoskeletal:  Negative for back pain, joint pain and myalgias.  Skin:  Negative for rash.  Neurological:  Negative for dizziness, tingling, focal weakness, seizures, weakness and headaches.  Endo/Heme/Allergies:  Does not bruise/bleed easily.  Psychiatric/Behavioral:  Negative for depression and suicidal ideas. The patient does not have insomnia.      Allergies  Allergen Reactions   Ace Inhibitors Other (See Comments)    Cough   Hydrochlorothiazide Other (See Comments)    Dizziness   Triamterene-Hctz Other (See Comments)    Appetite loss     Past Medical History:  Diagnosis Date   Anemia    Breast cancer ([place]) 2018   Left   Diabetes mellitus without complication ([place])    diet controlled   Edema    ankles   GERD (gastroesophageal reflux disease)    Hypertension    Mood disorder ([place]) 08/21/2020   Unexplained weight loss 07/24/2015   EGD and Colonoscopy normal 2014    Wears dentures    Full  lower, partial upper     Past Surgical History:  Procedure Laterality Date   ABDOMINAL HYSTERECTOMY  1980   BREAST BIOPSY Left 2018   DCIS   BREAST LUMPECTOMY Left 03/2017   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 WITH    BREAST LUMPECTOMY WITH NEEDLE LOCALIZATION Left  04/07/2017   Procedure: BREAST LUMPECTOMY WITH NEEDLE LOCALIZATION;  Surgeon: Vickie Epley, MD;  Location: [facility] ORS;  Service: General;  Laterality: Left;   BREAST SURGERY Left    Breast Biopsy   CATARACT EXTRACTION W/PHACO Right 06/12/2016   Procedure: CATARACT EXTRACTION PHACO AND INTRAOCULAR LENS PLACEMENT ([place]);  Surgeon: Eulogio Bear, MD;  Location: [facility] ORS;  Service: Ophthalmology;  Laterality: Right;  [place] 6.1 AP% 12.7 CDE .77 FLUID PACK LOT # [id number]   CATARACT EXTRACTION W/PHACO Left 04/02/2021   Procedure: CATARACT EXTRACTION PHACO AND INTRAOCULAR LENS PLACEMENT ([place]) LEFT MILOOP DIABETIC;  Surgeon: Birder Robson, MD;  Location: [place];  Service: Ophthalmology;  Laterality: Left;  Diabetic - diet controlled 52.81 03:47.0   COLONOSCOPY  08/02/2012   Dr. Donnella Sham - diverticuli otherwise normal   ESOPHAGOGASTRODUODENOSCOPY  08/02/2012   chronic gastritis   EYE SURGERY Right    Cataract Extraction with IOL   OOPHORECTOMY      Social History   Socioeconomic History   Marital status: Married    Spouse name: Not on file   Number of children: 0   Years of education: Not on file   Highest education level: 10th grade  Occupational History   Occupation: Retired  Tobacco Use   Smoking status: Never   Smokeless tobacco: Never   Tobacco comments:    smoking cessation materials not required  Vaping Use   Vaping Use: Never used  Substance and Sexual Activity   Alcohol use: No    Alcohol/week: 0.0 standard drinks   Drug use: No   Sexual activity: Not Currently  Other Topics Concern   Not on file  Social History Narrative   Not on file   Social Determinants of Health   Financial Resource Strain: Medium Risk   Difficulty of Paying Living Expenses: Somewhat hard  Food Insecurity: No Food Insecurity   Worried About Charity fundraiser in the Last Year: Never true   Ran Out of Food in the Last Year: Never true  Transportation Needs: No Transportation  Needs   Lack of Transportation (Medical): No   Lack of Transportation (Non-Medical): No  Physical Activity: Inactive   Days of Exercise per Week: 0 days   Minutes of Exercise per Session: 0 min  Stress: No Stress Concern Present   Feeling of Stress : Not at all  Social Connections: Moderately Integrated   Frequency of Communication with Friends and Family: More than three times a week   Frequency of Social Gatherings with Friends and Family: Twice a week   Attends Religious Services: More than 4 times per year   Active Member of [employer] or Organizations: No   Attends Archivist Meetings: Never   Marital Status: Married  Human resources officer Violence: Not At Risk   Fear of Current or Ex-Partner: No   Emotionally Abused: No   Physically Abused: No   Sexually Abused: No    Family History  Problem Relation Age of Onset   Breast cancer Mother 31   Diabetes Mother    Hypertension Mother    CAD Father    Breast cancer Sister  Hypertension Sister    Breast cancer Maternal Aunt 67   Throat cancer Brother      Current Outpatient Medications:    ACCU-CHEK AVIVA PLUS test strip, TEST TWICE DAILY, Disp: 200 strip, Rfl: 3   Accu-Chek Softclix Lancets lancets, TEST TWICE DAILY, Disp: 200 each, Rfl: 3   anastrozole (ARIMIDEX) 1 MG tablet, TAKE 1 TABLET BY MOUTH  DAILY, Disp: 90 tablet, Rfl: 3   aspirin 81 MG chewable tablet, Chew by mouth daily., Disp: , Rfl:    atorvastatin (LIPITOR) 10 MG tablet, TAKE 1 TABLET BY MOUTH  DAILY, Disp: 90 tablet, Rfl: 2   blood glucose meter kit and supplies, Dispense based on patient and insurance preference. Use up to four times daily as needed. (FOR ICD-10 E11.9)., Disp: 1 each, Rfl: 0   Calcium Carb-Cholecalciferol (CALCIUM 500 + D) 500-125 MG-UNIT TABS, Take 2 tablets by mouth daily., Disp: , Rfl:    fluocinonide ointment (LIDEX) 9.02 %, Apply 1 application topically 2 (two) times daily., Disp: , Rfl:    fluticasone (FLONASE) 50 MCG/ACT nasal  spray, Place 2 sprays into both nostrils daily., Disp: 48 g, Rfl: 3   irbesartan (AVAPRO) 75 MG tablet, TAKE 1 TABLET BY MOUTH  DAILY, Disp: 90 tablet, Rfl: 1   docusate sodium (COLACE) 100 MG capsule, Take 100 mg by mouth daily as needed for mild constipation. (Patient not taking: Reported on 08/30/2021), Disp: , Rfl:    triamcinolone cream (KENALOG) 0.1 %, Apply topically 2 (two) times daily. (Patient not taking: Reported on 08/30/2021), Disp: 30 g, Rfl: 5  Physical exam:  Vitals:   08/30/21 1107  BP: (!) 153/77  Pulse: 81  Resp: 16  Temp: (!) 97.2 F (36.2 C)  SpO2: 100%  Weight: 131 lb (59.4 kg)   Physical Exam Cardiovascular:     Rate and Rhythm: Normal rate and regular rhythm.     Heart sounds: Normal heart sounds.  Pulmonary:     Effort: Pulmonary effort is normal.     Breath sounds: Normal breath sounds.  Abdominal:     General: Bowel sounds are normal.     Palpations: Abdomen is soft.  Skin:    General: Skin is warm and dry.  Neurological:     Mental Status: She is alert and oriented to person, place, and time.   Breast exam was performed in seated and lying down position. Patient is status post left lumpectomy with a well-healed surgical scar. No evidence of any palpable masses. No evidence of axillary adenopathy. No evidence of any palpable masses or lumps in the right breast. No evidence of right axillary adenopathy   CMP Latest Ref Rng & Units 04/22/2021  Glucose 65 - 99 mg/dL 119(H)  BUN 8 - 27 mg/dL 18  Creatinine 0.57 - 1.00 mg/dL 1.12(H)  Sodium 134 - 144 mmol/L 139  Potassium 3.5 - 5.2 mmol/L 4.3  Chloride 96 - 106 mmol/L 99  CO2 20 - 29 mmol/L 22  Calcium 8.7 - 10.3 mg/dL 10.1  Total Protein 6.0 - 8.5 g/dL 8.2  Total Bilirubin 0.0 - 1.2 mg/dL 0.4  Alkaline Phos 44 - 121 IU/L 87  AST 0 - 40 IU/L 19  ALT 0 - 32 IU/L 14   CBC Latest Ref Rng & Units 04/22/2021  WBC 3.4 - 10.8 x10E3/uL 5.2  Hemoglobin 11.1 - 15.9 g/dL 13.0  Hematocrit 34.0 - 46.6 % 39.4   Platelets 150 - 450 x10E3/uL 289     Assessment and plan- Patient  is a 75 y.o. female with history of ER positive left breast DCIS on Arimidex here for routine follow-up  Clinically patient is doing well with no concerning signs and symptoms of recurrence based on today's exam.  Patient will continue taking Arimidex until June 2023.  She will be due for a mammogram in November 2022 which I will schedule.  We will also check a bone density at that time.  I will see her back in 6 months   Visit Diagnosis 1. Visit for monitoring Arimidex therapy   2. Encounter for follow-up surveillance of ductal carcinoma in situ (DCIS) of breast      Dr. Randa Evens, MD, MPH [facility] [phone] 09/08/2021 9:02 AM

## 2021-09-30 ENCOUNTER — Other Ambulatory Visit: Payer: Self-pay

## 2021-09-30 ENCOUNTER — Ambulatory Visit
Admission: RE | Admit: 2021-09-30 | Discharge: 2021-09-30 | Disposition: A | Discharge status: Home / Self Care | Payer: Medicare Other | Source: Ambulatory Visit | Attending: Oncology | Admitting: Oncology

## 2021-09-30 DIAGNOSIS — Z5181 Encounter for therapeutic drug level monitoring: Secondary | ICD-10-CM | POA: Diagnosis not present

## 2021-09-30 DIAGNOSIS — Z780 Asymptomatic menopausal state: Secondary | ICD-10-CM | POA: Diagnosis not present

## 2021-09-30 DIAGNOSIS — Z1231 Encounter for screening mammogram for malignant neoplasm of breast: Secondary | ICD-10-CM | POA: Diagnosis not present

## 2021-09-30 DIAGNOSIS — M8580 Other specified disorders of bone density and structure, unspecified site: Secondary | ICD-10-CM | POA: Diagnosis not present

## 2021-09-30 DIAGNOSIS — Z79811 Long term (current) use of aromatase inhibitors: Secondary | ICD-10-CM

## 2021-09-30 DIAGNOSIS — Z08 Encounter for follow-up examination after completed treatment for malignant neoplasm: Secondary | ICD-10-CM | POA: Diagnosis not present

## 2021-09-30 DIAGNOSIS — Z853 Personal history of malignant neoplasm of breast: Secondary | ICD-10-CM | POA: Diagnosis not present

## 2021-09-30 DIAGNOSIS — M810 Age-related osteoporosis without current pathological fracture: Secondary | ICD-10-CM | POA: Diagnosis not present

## 2021-09-30 DIAGNOSIS — M8588 Other specified disorders of bone density and structure, other site: Secondary | ICD-10-CM | POA: Diagnosis not present

## 2021-10-10 DIAGNOSIS — E119 Type 2 diabetes mellitus without complications: Secondary | ICD-10-CM | POA: Diagnosis not present

## 2021-10-10 DIAGNOSIS — Z961 Presence of intraocular lens: Secondary | ICD-10-CM | POA: Diagnosis not present

## 2021-10-22 ENCOUNTER — Other Ambulatory Visit: Payer: Self-pay

## 2021-10-22 ENCOUNTER — Ambulatory Visit (INDEPENDENT_AMBULATORY_CARE_PROVIDER_SITE_OTHER): Payer: Medicare Other | Admitting: Internal Medicine

## 2021-10-22 ENCOUNTER — Encounter: Payer: Self-pay | Admitting: Internal Medicine

## 2021-10-22 VITALS — BP 126/70 | HR 76 | Ht 67.0 in | Wt 132.2 lb

## 2021-10-22 DIAGNOSIS — N1831 Chronic kidney disease, stage 3a: Secondary | ICD-10-CM

## 2021-10-22 DIAGNOSIS — I10 Essential (primary) hypertension: Secondary | ICD-10-CM | POA: Diagnosis not present

## 2021-10-22 DIAGNOSIS — K5904 Chronic idiopathic constipation: Secondary | ICD-10-CM | POA: Insufficient documentation

## 2021-10-22 DIAGNOSIS — R7303 Prediabetes: Secondary | ICD-10-CM | POA: Diagnosis not present

## 2021-10-22 MED ORDER — ACCU-CHEK GUIDE VI STRP: ORAL_STRIP | 12 refills | Status: DC

## 2021-10-22 NOTE — Progress Notes (Signed)
Date:  10/22/2021   Name:  Brandy Diaz   DOB:  Jun 25, 1946   MRN:  [id number]   Chief Complaint: Hypertension and Prediabetes  Hypertension This is a chronic problem. The problem is controlled. Pertinent negatives include no chest pain, headaches, palpitations or shortness of breath. Past treatments include angiotensin blockers. The current treatment provides significant improvement. There are no compliance problems.   Diabetes She presents for her follow-up diabetic visit. Diabetes type: prediabetes. Her disease course has been stable. Pertinent negatives for hypoglycemia include no dizziness, headaches or nervousness/anxiousness. Pertinent negatives for diabetes include no chest pain, no fatigue and no weakness. Current diabetic treatment includes diet. She is compliant with treatment most of the time.  Constipation This is a chronic problem. Her stool frequency is 1 time per day. The stool is described as firm. The patient is not on a high fiber diet. She Exercises regularly. There has Been adequate water intake. Associated symptoms include back pain. Pertinent negatives include no abdominal pain or diarrhea. She has tried stool softeners for the symptoms. The treatment provided mild relief.   Lab Results  Component Value Date   NA 139 04/22/2021   K 4.3 04/22/2021   CO2 22 04/22/2021   GLUCOSE 119 (H) 04/22/2021   BUN 18 04/22/2021   CREATININE 1.12 (H) 04/22/2021   CALCIUM 10.1 04/22/2021   EGFR 51 (L) 04/22/2021   GFRNONAA 46 (L) 08/17/2020   Lab Results  Component Value Date   CHOL 197 04/22/2021   HDL 100 04/22/2021   LDLCALC 86 04/22/2021   TRIG 57 04/22/2021   CHOLHDL 2.0 04/22/2021   Lab Results  Component Value Date   TSH 1.220 04/22/2021   Lab Results  Component Value Date   HGBA1C 6.1 (H) 04/22/2021   Lab Results  Component Value Date   WBC 5.2 04/22/2021   HGB 13.0 04/22/2021   HCT 39.4 04/22/2021   MCV 91 04/22/2021   PLT 289 04/22/2021   Lab  Results  Component Value Date   ALT 14 04/22/2021   AST 19 04/22/2021   ALKPHOS 87 04/22/2021   BILITOT 0.4 04/22/2021   Lab Results  Component Value Date   VD25OH 58.4 04/22/2021     Review of Systems  Constitutional:  Negative for fatigue and unexpected weight change.  HENT:  Negative for nosebleeds.   Eyes:  Negative for visual disturbance.  Respiratory:  Negative for cough, chest tightness, shortness of breath and wheezing.   Cardiovascular:  Negative for chest pain, palpitations and leg swelling.  Gastrointestinal:  Positive for constipation. Negative for abdominal pain, blood in stool and diarrhea.  Musculoskeletal:  Positive for back pain. Negative for arthralgias.  Neurological:  Negative for dizziness, weakness, light-headedness and headaches.  Psychiatric/Behavioral:  Negative for dysphoric mood and sleep disturbance. The patient is not nervous/anxious.    Patient Active Problem List   Diagnosis Date Noted   Osteopenia due to cancer therapy 04/22/2021   Venous stasis dermatitis of both lower extremities 11/08/2019   Ductal carcinoma in situ (DCIS) of left breast 05/21/2017   Localized edema 04/28/2017   Stage 3a chronic kidney disease ([place]) 01/01/2016   Essential (primary) hypertension 07/24/2015   Gastro-esophageal reflux disease without esophagitis 07/24/2015   Mixed hyperlipidemia 07/24/2015   Prediabetes 07/24/2015   Fibrocystic breast changes 07/24/2015   Anxiety 05/24/2012    Allergies  Allergen Reactions   Ace Inhibitors Other (See Comments)    Cough   Hydrochlorothiazide Other (See Comments)  Dizziness   Triamterene-Hctz Other (See Comments)    Appetite loss    Past Surgical History:  Procedure Laterality Date   ABDOMINAL HYSTERECTOMY  1980   BREAST BIOPSY Left 2018   DCIS   BREAST LUMPECTOMY Left 03/2017   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 WITH    BREAST LUMPECTOMY WITH NEEDLE LOCALIZATION Left 04/07/2017    Procedure: BREAST LUMPECTOMY WITH NEEDLE LOCALIZATION;  Surgeon: Vickie Epley, MD;  Location: [facility] ORS;  Service: General;  Laterality: Left;   BREAST SURGERY Left    Breast Biopsy   CATARACT EXTRACTION W/PHACO Right 06/12/2016   Procedure: CATARACT EXTRACTION PHACO AND INTRAOCULAR LENS PLACEMENT ([place]);  Surgeon: Eulogio Bear, MD;  Location: [facility] ORS;  Service: Ophthalmology;  Laterality: Right;  [place] 6.1 AP% 12.7 CDE .77 FLUID PACK LOT # [id number]   CATARACT EXTRACTION W/PHACO Left 04/02/2021   Procedure: CATARACT EXTRACTION PHACO AND INTRAOCULAR LENS PLACEMENT ([place]) LEFT MILOOP DIABETIC;  Surgeon: Birder Robson, MD;  Location: [place];  Service: Ophthalmology;  Laterality: Left;  Diabetic - diet controlled 52.81 03:47.0   COLONOSCOPY  08/02/2012   Dr. Donnella Sham - diverticuli otherwise normal   ESOPHAGOGASTRODUODENOSCOPY  08/02/2012   chronic gastritis   EYE SURGERY Right    Cataract Extraction with IOL   OOPHORECTOMY      Social History   Tobacco Use   Smoking status: Never   Smokeless tobacco: Never   Tobacco comments:    smoking cessation materials not required  Vaping Use   Vaping Use: Never used  Substance Use Topics   Alcohol use: No    Alcohol/week: 0.0 standard drinks   Drug use: No     Medication list has been reviewed and updated.  Current Meds  Medication Sig   ACCU-CHEK AVIVA PLUS test strip TEST TWICE DAILY   Accu-Chek Softclix Lancets lancets TEST TWICE DAILY   anastrozole (ARIMIDEX) 1 MG tablet TAKE 1 TABLET BY MOUTH  DAILY   aspirin 81 MG chewable tablet Chew by mouth daily.   atorvastatin (LIPITOR) 10 MG tablet TAKE 1 TABLET BY MOUTH  DAILY   blood glucose meter kit and supplies Dispense based on patient and insurance preference. Use up to four times daily as needed. (FOR ICD-10 E11.9).   Calcium Carb-Cholecalciferol (CALCIUM 500 + D) 500-125 MG-UNIT TABS Take 2 tablets by mouth daily.   fluocinonide ointment (LIDEX) 2.06 % Apply 1  application topically 2 (two) times daily.   fluticasone (FLONASE) 50 MCG/ACT nasal spray Place 2 sprays into both nostrils daily.   irbesartan (AVAPRO) 75 MG tablet TAKE 1 TABLET BY MOUTH  DAILY    PHQ 2/9 Scores 10/22/2021 05/06/2021 04/22/2021 06/04/2020  PHQ - 2 Score 0 0 0 0  PHQ- 9 Score 0 - 2 0    GAD 7 : Generalized Anxiety Score 10/22/2021 04/22/2021 06/04/2020 05/21/2020  Nervous, Anxious, on Edge 0 0 0 0  Control/stop worrying 0 0 0 0  Worry too much - different things 0 0 0 0  Trouble relaxing 0 0 0 0  Restless 0 0 0 0  Easily annoyed or irritable 0 0 1 0  Afraid - awful might happen 0 0 0 0  Total GAD 7 Score 0 0 1 0  Anxiety Difficulty Not difficult at all - Not difficult at all -    BP Readings from Last 3 Encounters:  10/22/21 126/70  08/30/21 (!) 153/77  05/06/21 128/68    Physical Exam Vitals  and nursing note reviewed.  Constitutional:      General: She is not in acute distress.    Appearance: She is well-developed.  HENT:     Head: Normocephalic and atraumatic.  Cardiovascular:     Rate and Rhythm: Normal rate and regular rhythm.     Pulses: Normal pulses.     Heart sounds: No murmur heard. Pulmonary:     Effort: Pulmonary effort is normal. No respiratory distress.     Breath sounds: No wheezing or rhonchi.  Abdominal:     General: Abdomen is flat.     Palpations: Abdomen is soft.  Musculoskeletal:     Cervical back: Normal range of motion.     Right lower leg: No edema.     Left lower leg: No edema.  Lymphadenopathy:     Cervical: No cervical adenopathy.  Skin:    General: Skin is warm and dry.     Capillary Refill: Capillary refill takes less than 2 seconds.     Findings: No rash.  Neurological:     General: No focal deficit present.     Mental Status: She is alert and oriented to person, place, and time.  Psychiatric:        Mood and Affect: Mood normal.        Behavior: Behavior normal.    Wt Readings from Last 3 Encounters:  10/22/21  132 lb 3.2 oz (60 kg)  08/30/21 131 lb (59.4 kg)  05/06/21 129 lb 3.2 oz (58.6 kg)    BP 126/70   Pulse 76   Ht _0  (1.702 m)   Wt 132 lb 3.2 oz (60 kg)   SpO2 98%   BMI 20.71 kg/m   Assessment and Plan: 1. Essential (primary) hypertension Clinically stable exam with well controlled BP. Tolerating medications without side effects at this time. Pt to continue current regimen and low sodium diet; benefits of regular exercise as able discussed.  2. Prediabetes Continue healthy diet and regular home monitoring - Hemoglobin A1c - glucose blood (ACCU-CHEK GUIDE) test strip; Use as instructed  Dispense: 100 each; Refill: 12  3. Stage 3a chronic kidney disease (HCC) Continue to avoid nsaids. - Basic metabolic panel  4. Chronic idiopathic constipation Continue Colace Add Miralax powder daily - adjust dose to desired effect   Partially dictated using Editor, commissioning. Any errors are unintentional.  Halina Maidens, MD [facility]  10/22/2021

## 2021-10-22 NOTE — Patient Instructions (Addendum)
Miralax powder (or Glycolax powder) - mix a cap full in your morning coffee every day.

## 2021-10-23 LAB — BASIC METABOLIC PANEL
BUN/Creatinine Ratio: 16 (ref 12–28)
BUN: 20 mg/dL (ref 8–27)
CO2: 23 mmol/L (ref 20–29)
Calcium: 9.7 mg/dL (ref 8.7–10.3)
Chloride: 100 mmol/L (ref 96–106)
Creatinine, Ser: 1.24 mg/dL — ABNORMAL HIGH (ref 0.57–1.00)
Glucose: 88 mg/dL (ref 70–99)
Potassium: 4 mmol/L (ref 3.5–5.2)
Sodium: 138 mmol/L (ref 134–144)
eGFR: 45 mL/min/{1.73_m2} — ABNORMAL LOW (ref >59)

## 2021-10-23 LAB — HEMOGLOBIN A1C
Est. average glucose Bld gHb Est-mCnc: 123 mg/dL
Hgb A1c MFr Bld: 5.9 % — ABNORMAL HIGH (ref 4.8–5.6)

## 2021-11-15 ENCOUNTER — Other Ambulatory Visit: Payer: Self-pay | Admitting: Internal Medicine

## 2021-11-15 DIAGNOSIS — I10 Essential (primary) hypertension: Secondary | ICD-10-CM

## 2021-11-15 NOTE — Telephone Encounter (Signed)
Requested medication (s) are due for refill today: yes  Requested medication (s) are on the active medication list: yes  Last refill:  05/29/21 #90 1 refills  Future visit scheduled: yes in 5 months  Notes to clinic:  requesting 1 year supply. Do you want to refill for 1 year?     Requested Prescriptions  Pending Prescriptions Disp Refills   irbesartan (AVAPRO) 75 MG tablet [Pharmacy Med Name: IRBESARTAN  75MG   TAB] 90 tablet 3    Sig: TAKE 1 TABLET BY MOUTH  DAILY     Cardiovascular:  Angiotensin Receptor Blockers Failed - 11/15/2021  4:47 AM      Failed - Cr in normal range and within 180 days    Creatinine  Date Value Ref Range Status  02/22/2012 1.11 0.60 - 1.30 mg/dL Final   Creatinine, Ser  Date Value Ref Range Status  10/22/2021 1.24 (H) 0.57 - 1.00 mg/dL Final          Passed - K in normal range and within 180 days    Potassium  Date Value Ref Range Status  10/22/2021 4.0 3.5 - 5.2 mmol/L Final  02/22/2012 4.0 3.5 - 5.1 mmol/L Final          Passed - Patient is not pregnant      Passed - Last BP in normal range    BP Readings from Last 1 Encounters:  10/22/21 126/70          Passed - Valid encounter within last 6 months    Recent Outpatient Visits           3 weeks ago Essential (primary) hypertension   [facility] Glean Hess, MD   6 months ago Annual physical exam   [facility] Glean Hess, MD   1 year ago Prediabetes   [facility] Glean Hess, MD   1 year ago Venous stasis dermatitis of both lower extremities   [facility] Glean Hess, MD   1 year ago Essential (primary) hypertension   [facility] Glean Hess, MD       Future Appointments             In 5 months Army Melia Jesse Sans, MD [facility], [facility]

## 2021-11-22 ENCOUNTER — Inpatient Hospital Stay: Payer: Commercial Managed Care - HMO | Attending: Oncology | Admitting: Oncology

## 2021-11-22 ENCOUNTER — Encounter: Payer: Self-pay | Admitting: Oncology

## 2021-11-22 ENCOUNTER — Other Ambulatory Visit: Payer: Self-pay

## 2021-11-22 VITALS — BP 146/83 | HR 111 | Ht 67.0 in | Wt 133.0 lb

## 2021-11-22 DIAGNOSIS — M7989 Other specified soft tissue disorders: Secondary | ICD-10-CM | POA: Diagnosis not present

## 2021-11-22 DIAGNOSIS — E119 Type 2 diabetes mellitus without complications: Secondary | ICD-10-CM | POA: Diagnosis not present

## 2021-11-22 DIAGNOSIS — Z803 Family history of malignant neoplasm of breast: Secondary | ICD-10-CM | POA: Insufficient documentation

## 2021-11-22 DIAGNOSIS — Z808 Family history of malignant neoplasm of other organs or systems: Secondary | ICD-10-CM | POA: Insufficient documentation

## 2021-11-22 DIAGNOSIS — Z833 Family history of diabetes mellitus: Secondary | ICD-10-CM | POA: Diagnosis not present

## 2021-11-22 DIAGNOSIS — D0512 Intraductal carcinoma in situ of left breast: Secondary | ICD-10-CM | POA: Insufficient documentation

## 2021-11-22 DIAGNOSIS — Z170 Estrogen receptor positive status [ER+]: Secondary | ICD-10-CM | POA: Insufficient documentation

## 2021-11-22 DIAGNOSIS — M810 Age-related osteoporosis without current pathological fracture: Secondary | ICD-10-CM

## 2021-11-22 DIAGNOSIS — Z5181 Encounter for therapeutic drug level monitoring: Secondary | ICD-10-CM

## 2021-11-22 DIAGNOSIS — M85852 Other specified disorders of bone density and structure, left thigh: Secondary | ICD-10-CM | POA: Insufficient documentation

## 2021-11-22 DIAGNOSIS — Z8249 Family history of ischemic heart disease and other diseases of the circulatory system: Secondary | ICD-10-CM | POA: Insufficient documentation

## 2021-11-22 DIAGNOSIS — Z596 Low income: Secondary | ICD-10-CM | POA: Insufficient documentation

## 2021-11-22 DIAGNOSIS — Z90721 Acquired absence of ovaries, unilateral: Secondary | ICD-10-CM | POA: Diagnosis not present

## 2021-11-22 DIAGNOSIS — Z79899 Other long term (current) drug therapy: Secondary | ICD-10-CM | POA: Diagnosis not present

## 2021-11-22 DIAGNOSIS — I10 Essential (primary) hypertension: Secondary | ICD-10-CM | POA: Diagnosis not present

## 2021-11-22 DIAGNOSIS — Z79811 Long term (current) use of aromatase inhibitors: Secondary | ICD-10-CM | POA: Diagnosis not present

## 2021-11-22 MED ORDER — ALENDRONATE SODIUM 70 MG PO TABS: 70.0000 mg | ORAL_TABLET | ORAL | 3 refills | Status: DC

## 2021-11-22 NOTE — Progress Notes (Signed)
Pt said that she is doing good, taking breast cancer pills. No side effects. Getting some of her wt back today she is 133lb.

## 2021-11-22 NOTE — Progress Notes (Signed)
Hematology/Oncology Consult note [facility]  [phone] [phone]  Patient Care Team: Glean Hess, MD as PCP - General (Internal Medicine) Sindy Guadeloupe, MD as Consulting Physician (Oncology) Eulogio Bear, MD as Consulting Physician (Ophthalmology) Rory Percy, MD as Referring Physician (Dermatology)   Name of the patient: Brandy Diaz  [id number]  1946-05-01   Date of visit: 11/22/21  Diagnosis- left breast ER+ DCIS  Chief complaint/ Reason for visit-discuss bone density scan results  Heme/Onc history: Patient is a 76 year old female with a past medical history significant for hypertension and diet-controlled diabetes who underwent screening mammogram which showed an abnormal mass in the left breast 3 cm from the nipple 1 x 0.9 x 0.3 cm in size and mobile mass at 4:00 position 0.8 x 0.7 x 0.5 cm in size. Evaluation of axilla was negative for adenopathy. This was followed by ultrasound and the mass at the 5:30 position was stable long-term and consistent with benign lesion and core biopsy of the 4:00 position mass was recommended.   2. 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    3. 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 No necrosis was identified. Margins were negative for DCIS 8 mm from the deep margin. ER 5190% positive PR negative   4. Bone density scan in June 2018 showed T score of -2.2 consistent with osteopenia. 10 year probability of a major osteoporotic fracture was 6% and hip fracture was 1.5%. Patient started on arimidex in June 2018    Interval history-patient reports no acute complaints at this time.  She has chronic bilateral lower extremity swelling  ECOG PS- 1 Pain scale- 0   Review of systems- Review of Systems  Constitutional:  Negative  for chills, fever, malaise/fatigue and weight loss.  HENT:  Negative for congestion, ear discharge and nosebleeds.   Eyes:  Negative for blurred vision.  Respiratory:  Negative for cough, hemoptysis, sputum production, shortness of breath and wheezing.   Cardiovascular:  Negative for chest pain, palpitations, orthopnea and claudication.  Gastrointestinal:  Negative for abdominal pain, blood in stool, constipation, diarrhea, heartburn, melena, nausea and vomiting.  Genitourinary:  Negative for dysuria, flank pain, frequency, hematuria and urgency.  Musculoskeletal:  Negative for back pain, joint pain and myalgias.  Skin:  Negative for rash.  Neurological:  Negative for dizziness, tingling, focal weakness, seizures, weakness and headaches.  Endo/Heme/Allergies:  Does not bruise/bleed easily.  Psychiatric/Behavioral:  Negative for depression and suicidal ideas. The patient does not have insomnia.       Allergies  Allergen Reactions   Ace Inhibitors Other (See Comments)    Cough   Hydrochlorothiazide Other (See Comments)    Dizziness   Triamterene-Hctz Other (See Comments)    Appetite loss     Past Medical History:  Diagnosis Date   Anemia    Breast cancer ([place]) 2018   Left   Diabetes mellitus without complication ([place])    diet controlled   Edema    ankles   GERD (gastroesophageal reflux disease)    Hypertension    Mood disorder ([place]) 08/21/2020   Unexplained weight loss 07/24/2015   EGD and Colonoscopy normal 2014    Wears dentures    Full lower, partial upper     Past Surgical History:  Procedure Laterality Date   ABDOMINAL HYSTERECTOMY  1980   BREAST BIOPSY Left 2018   DCIS   BREAST LUMPECTOMY Left 03/2017   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 WITH    BREAST LUMPECTOMY WITH NEEDLE LOCALIZATION Left 04/07/2017   Procedure: BREAST LUMPECTOMY WITH NEEDLE LOCALIZATION;  Surgeon: Vickie Epley, MD;  Location: [facility] OR[facility];  Service: General;   Laterality: Left;   BREAST SURGERY Left    Breast Biopsy   CATARACT EXTRACTION W/PHACO Right 06/12/2016   Procedure: CATARACT EXTRACTION PHACO AND INTRAOCULAR LENS PLACEMENT ([place]);  Surgeon: Eulogio Bear, MD;  Location: [facility] OR[facility];  Service: Ophthalmology;  Laterality: Right;  [place] 6.1 AP% 12.7 CDE .77 FLUID PACK LOT # [id number]   CATARACT EXTRACTION W/PHACO Left 04/02/2021   Procedure: CATARACT EXTRACTION PHACO AND INTRAOCULAR LENS PLACEMENT ([place]) LEFT MILOOP DIABETIC;  Surgeon: Birder Robson, MD;  Location: [facility];  Service: Ophthalmology;  Laterality: Left;  Diabetic - diet controlled 52.81 03:47.0   COLONOSCOPY  08/02/2012   Dr. Donnella Sham - diverticuli otherwise normal   ESOPHAGOGASTRODUODENOSCOPY  08/02/2012   chronic gastritis   EYE SURGERY Right    Cataract Extraction with IOL   OOPHORECTOMY      Social History   Socioeconomic History   Marital status: Married    Spouse name: Not on file   Number of children: 0   Years of education: Not on file   Highest education level: 10th grade  Occupational History   Occupation: Retired  Tobacco Use   Smoking status: Never   Smokeless tobacco: Never   Tobacco comments:    smoking cessation materials not required  Vaping Use   Vaping Use: Never used  Substance and Sexual Activity   Alcohol use: No    Alcohol/week: 0.0 standard drinks   Drug use: No   Sexual activity: Not Currently  Other Topics Concern   Not on file  Social History Narrative   Not on file   Social Determinants of Health   Financial Resource Strain: Medium Risk   Difficulty of Paying Living Expenses: Somewhat hard  Food Insecurity: No Food Insecurity   Worried About Charity fundraiser in the Last Year: Never true   Ran Out of Food in the Last Year: Never true  Transportation Needs: No Transportation Needs   Lack of Transportation (Medical): No   Lack of Transportation (Non-Medical): No  Physical Activity: Inactive   Days of Exercise  per Week: 0 days   Minutes of Exercise per Session: 0 min  Stress: No Stress Concern Present   Feeling of Stress : Not at all  Social Connections: Moderately Integrated   Frequency of Communication with Friends and Family: More than three times a week   Frequency of Social Gatherings with Friends and Family: Twice a week   Attends Religious Services: More than 4 times per year   Active Member of [employer] or Organizations: No   Attends Archivist Meetings: Never   Marital Status: Married  Human resources officer Violence: Not At Risk   Fear of Current or Ex-Partner: No   Emotionally Abused: No   Physically Abused: No   Sexually Abused: No    Family History  Problem Relation Age of Onset   Breast cancer Mother 6   Diabetes Mother    Hypertension Mother    CAD Father    Breast cancer Sister    Hypertension Sister    Breast cancer Maternal Aunt 77   Throat cancer Brother  Current Outpatient Medications:    Accu-Chek Softclix Lancets lancets, TEST TWICE DAILY, Disp: 200 each, Rfl: 3   alendronate (FOSAMAX) 70 MG tablet, Take 1 tablet (70 mg total) by mouth once a week. Take with a full glass of water on an empty stomach., Disp: 4 tablet, Rfl: 3   anastrozole (ARIMIDEX) 1 MG tablet, TAKE 1 TABLET BY MOUTH  DAILY, Disp: 90 tablet, Rfl: 3   aspirin 81 MG chewable tablet, Chew by mouth daily., Disp: , Rfl:    atorvastatin (LIPITOR) 10 MG tablet, TAKE 1 TABLET BY MOUTH  DAILY, Disp: 90 tablet, Rfl: 2   blood glucose meter kit and supplies, Dispense based on patient and insurance preference. Use up to four times daily as needed. (FOR ICD-10 E11.9)., Disp: 1 each, Rfl: 0   Calcium Carb-Cholecalciferol (CALCIUM 500 + D) 500-125 MG-UNIT TABS, Take 2 tablets by mouth daily., Disp: , Rfl:    fluocinonide ointment (LIDEX) 2.75 %, Apply 1 application topically 2 (two) times daily., Disp: , Rfl:    fluticasone (FLONASE) 50 MCG/ACT nasal spray, Place 2 sprays into both nostrils daily., Disp:  48 g, Rfl: 3   glucose blood (ACCU-CHEK GUIDE) test strip, Use as instructed, Disp: 100 each, Rfl: 12   irbesartan (AVAPRO) 75 MG tablet, TAKE 1 TABLET BY MOUTH  DAILY, Disp: 90 tablet, Rfl: 1  Physical exam:  Vitals:   11/22/21 1419  BP: (!) 146/83  Pulse: (!) 111  TempSrc: Tympanic  Weight: 133 lb (60.3 kg)  Height: _0  (1.702 m)   Physical Exam Constitutional:      General: She is not in acute distress. Cardiovascular:     Rate and Rhythm: Normal rate and regular rhythm.     Heart sounds: Normal heart sounds.  Pulmonary:     Effort: Pulmonary effort is normal.     Breath sounds: Normal breath sounds.  Musculoskeletal:     Comments: Trace bilateral edema  Skin:    General: Skin is warm and dry.  Neurological:     Mental Status: She is alert and oriented to person, place, and time.     CMP Latest Ref Rng & Units 10/22/2021  Glucose 70 - 99 mg/dL 88  BUN 8 - 27 mg/dL 20  Creatinine 0.57 - 1.00 mg/dL 1.24(H)  Sodium 134 - 144 mmol/L 138  Potassium 3.5 - 5.2 mmol/L 4.0  Chloride 96 - 106 mmol/L 100  CO2 20 - 29 mmol/L 23  Calcium 8.7 - 10.3 mg/dL 9.7  Total Protein 6.0 - 8.5 g/dL -  Total Bilirubin 0.0 - 1.2 mg/dL -  Alkaline Phos 44 - 121 IU/L -  AST 0 - 40 IU/L -  ALT 0 - 32 IU/L -   CBC Latest Ref Rng & Units 04/22/2021  WBC 3.4 - 10.8 x10E3/uL 5.2  Hemoglobin 11.1 - 15.9 g/dL 13.0  Hematocrit 34.0 - 46.6 % 39.4  Platelets 150 - 450 x10E3/uL 289     Assessment and plan- Patient is a 76 y.o. female with history of ER positive left breast DCIS on Arimidex here to discuss bone density scan results and further follow-up  Patient started taking Arimidex in June 2018 and she will be completing 5 years in June 2023.  We have been monitoring her bone density scan since the start of therapy.Her present bone density shows worsening of her osteopenia which has now progressed to osteoporosis with a T score of -2.5 in the left femur neck.  Although this could be  age-related changes and concerned that Arimidex could potentially make it worse.  I therefore asked her to hold off on taking Arimidex.  She will stop her hormone therapy for DCIS prematurely 6 months earlier  With regards to osteoporosis we discussed the options of both oral bisphosphonate such as Fosamax and parenteral bisphosphonate such as plasterer Prolia.  Patient prefers to take weekly Fosamax and we will send her a prescription for the same.  She will also continue calcium and vitamin D at this time.  I have encouraged her to speak to her primary care Dr. Army Melia at her next visit about osteoporosis and monitoring as well.  I will see her back in 6 months with 25-hydroxy vitamin D.  At that point I would potentially consider having her follow-up with PCP alone given that she has now finished 5 years since the start of DCIS and will need yearly mammograms and breast exams moving forward.  Patient comprehends my plan well   Visit Diagnosis 1. Age-related osteoporosis without current pathological fracture   2. Visit for monitoring Arimidex therapy      Dr. Randa Evens, MD, MPH [facility] [phone] 11/22/2021 3:47 PM

## 2021-12-05 ENCOUNTER — Other Ambulatory Visit: Payer: Self-pay

## 2022-02-21 ENCOUNTER — Other Ambulatory Visit: Payer: Self-pay | Admitting: Internal Medicine

## 2022-02-21 DIAGNOSIS — E782 Mixed hyperlipidemia: Secondary | ICD-10-CM

## 2022-02-21 DIAGNOSIS — N183 Chronic kidney disease, stage 3 unspecified: Secondary | ICD-10-CM

## 2022-02-24 NOTE — Telephone Encounter (Signed)
Requested Prescriptions  ?Pending Prescriptions Disp Refills  ?? Accu-Chek Softclix Lancets lancets [Pharmacy Med Name: Accu-Chek Softclix Lancets] 200 each 2  ?  Sig: TEST TWICE DAILY  ?  ? Endocrinology: Diabetes - Testing Supplies Passed - 02/21/2022 11:34 PM  ?  ?  Passed - Valid encounter within last 12 months  ?  Recent Outpatient Visits   ?      ? 4 months ago Essential (primary) hypertension  ? [facility] Glean Hess, MD  ? 10 months ago Annual physical exam  ? [facility] Glean Hess, MD  ? 1 year ago Prediabetes  ? [facility] Glean Hess, MD  ? 1 year ago Venous stasis dermatitis of both lower extremities  ? [facility] Glean Hess, MD  ? 1 year ago Essential (primary) hypertension  ? [facility] Glean Hess, MD  ?  ?  ?Future Appointments   ?        ? In 1 month Army Melia Jesse Sans, MD [facility]  ?  ? ?  ?  ?  ?? atorvastatin (LIPITOR) 10 MG tablet [Pharmacy Med Name: Atorvastatin Calcium 10 MG Oral Tablet] 100 tablet 0  ?  Sig: TAKE 1 TABLET BY MOUTH  DAILY  ?  ? Cardiovascular:  Antilipid - Statins Failed - 02/21/2022 11:34 PM  ?  ?  Failed - Lipid Panel in normal range within the last 12 months  ?  Cholesterol, Total  ?Date Value Ref Range Status  ?04/22/2021 197 100 - 199 mg/dL Final  ? ?Cholesterol  ?Date Value Ref Range Status  ?02/14/2012 218 (H) 0 - 200 mg/dL Final  ? ?Ldl Cholesterol, Calc  ?Date Value Ref Range Status  ?02/14/2012 118 (H) 0 - 100 mg/dL Final  ? ?LDL Chol Calc (NIH)  ?Date Value Ref Range Status  ?04/22/2021 86 0 - 99 mg/dL Final  ? ?HDL Cholesterol  ?Date Value Ref Range Status  ?02/14/2012 87 (H) 40 - 60 mg/dL Final  ? ?HDL  ?Date Value Ref Range Status  ?04/22/2021 100 >39 mg/dL Final  ? ?Triglycerides  ?Date Value Ref Range Status  ?04/22/2021 57 0 - 149 mg/dL Final  ?02/14/2012 64 0 - 200 mg/dL Final  ? ?  ?  ?  Passed - Patient is not pregnant  ?  ?  Passed - Valid encounter  within last 12 months  ?  Recent Outpatient Visits   ?      ? 4 months ago Essential (primary) hypertension  ? [facility] Glean Hess, MD  ? 10 months ago Annual physical exam  ? [facility] Glean Hess, MD  ? 1 year ago Prediabetes  ? [facility] Glean Hess, MD  ? 1 year ago Venous stasis dermatitis of both lower extremities  ? [facility] Glean Hess, MD  ? 1 year ago Essential (primary) hypertension  ? [facility] Glean Hess, MD  ?  ?  ?Future Appointments   ?        ? In 1 month Army Melia Jesse Sans, MD [facility] [place]  ?  ? ?  ?  ?  ? ? ?

## 2022-03-05 ENCOUNTER — Ambulatory Visit: Payer: Medicare Other | Admitting: Oncology

## 2022-03-05 ENCOUNTER — Other Ambulatory Visit: Payer: Medicare Other

## 2022-03-14 ENCOUNTER — Other Ambulatory Visit: Payer: Self-pay

## 2022-03-14 MED ORDER — ALENDRONATE SODIUM 70 MG PO TABS: 70.0000 mg | ORAL_TABLET | ORAL | 3 refills | Status: DC

## 2022-03-24 ENCOUNTER — Other Ambulatory Visit: Payer: Self-pay

## 2022-03-24 ENCOUNTER — Telehealth: Payer: Self-pay

## 2022-03-24 NOTE — Telephone Encounter (Signed)
Completed PA for ALENDRONATE Key: [id number]; was submitted and per [employer] medication is on providers plan of covered drugs.  ? ?

## 2022-04-24 ENCOUNTER — Ambulatory Visit (INDEPENDENT_AMBULATORY_CARE_PROVIDER_SITE_OTHER): Payer: Medicare Other | Admitting: Internal Medicine

## 2022-04-24 ENCOUNTER — Encounter: Payer: Self-pay | Admitting: Internal Medicine

## 2022-04-24 VITALS — BP 128/70 | HR 80 | Ht 67.0 in | Wt 127.0 lb

## 2022-04-24 DIAGNOSIS — R7303 Prediabetes: Secondary | ICD-10-CM

## 2022-04-24 DIAGNOSIS — E782 Mixed hyperlipidemia: Secondary | ICD-10-CM

## 2022-04-24 DIAGNOSIS — I10 Essential (primary) hypertension: Secondary | ICD-10-CM | POA: Diagnosis not present

## 2022-04-24 DIAGNOSIS — M810 Age-related osteoporosis without current pathological fracture: Secondary | ICD-10-CM | POA: Diagnosis not present

## 2022-04-24 DIAGNOSIS — N1831 Chronic kidney disease, stage 3a: Secondary | ICD-10-CM | POA: Diagnosis not present

## 2022-04-24 DIAGNOSIS — Z0000 Encounter for general adult medical examination without abnormal findings: Secondary | ICD-10-CM | POA: Diagnosis not present

## 2022-04-24 DIAGNOSIS — Z1231 Encounter for screening mammogram for malignant neoplasm of breast: Secondary | ICD-10-CM | POA: Diagnosis not present

## 2022-04-24 LAB — POCT URINALYSIS DIPSTICK
Bilirubin, UA: NEGATIVE
Blood, UA: NEGATIVE
Glucose, UA: NEGATIVE
Ketones, UA: NEGATIVE
Leukocytes, UA: NEGATIVE
Nitrite, UA: NEGATIVE
Protein, UA: NEGATIVE
Spec Grav, UA: 1.01 (ref 1.010–1.025)
Urobilinogen, UA: 0.2 E.U./dL
pH, UA: 6 (ref 5.0–8.0)

## 2022-04-24 NOTE — Progress Notes (Signed)
Date:  04/24/2022   Name:  Brandy Diaz   DOB:  12-Aug-1946   MRN:  [id number]   Chief Complaint: Annual Exam (Breast exam no pap ) Brandy Diaz is a 76 y.o. female who presents today for her Complete Annual Exam. She feels well. She reports walking/ yard work. She reports she is sleeping fairly well. Breast complaints - none.  Mammogram: 09/2021 DEXA: 09/2021 osteoporosis Pap smear: discontinued Colonoscopy: 2013  Health Maintenance Due  Topic Date Due   Zoster Vaccines- Shingrix (1 of 2) Never done   COVID-19 Vaccine (3 - Moderna risk series) 11/05/2020    Immunization History  Administered Date(s) Administered   Fluad Quad(high Dose 65+) 08/21/2020   Influenza Nasal 08/12/2019   Influenza Split 08/24/2017   Influenza, High Dose Seasonal PF 08/18/2017, 08/16/2018, 08/12/2019   Influenza-Unspecified 08/24/2016, 08/24/2017, 08/08/2021   Moderna Sars-Covid-2 Vaccination 02/20/2020, 03/21/2020   PFIZER SARS-COV-2 Pediatric Vaccination 5-80yr 10/08/2020   Pneumococcal Conjugate-13 12/27/2014   Pneumococcal Polysaccharide-23 11/11/2012, 05/05/2013, 05/05/2013   Tdap 01/20/2013, 05/11/2013    Hypertension This is a chronic problem. The problem is controlled (at home 124/70). Pertinent negatives include no chest pain, headaches, palpitations or shortness of breath. Past treatments include angiotensin blockers. The current treatment provides significant improvement. There are no compliance problems.  Hypertensive end-organ damage includes kidney disease. There is no history of CAD/MI or CVA.  Hyperlipidemia This is a chronic problem. The problem is controlled. Pertinent negatives include no chest pain or shortness of breath. Current antihyperlipidemic treatment includes statins.  Diabetes She presents for her follow-up diabetic visit. Diabetes type: prediabetes. Her disease course has been stable. Pertinent negatives for hypoglycemia include no dizziness, headaches,  nervousness/anxiousness or tremors. Pertinent negatives for diabetes include no chest pain, no fatigue, no polydipsia, no polyuria and no weakness. Pertinent negatives for diabetic complications include no CVA. Current diabetic treatment includes diet. Her breakfast blood glucose is taken between 6-7 am. An ACE inhibitor/angiotensin II receptor blocker is being taken.    Lab Results  Component Value Date   NA 138 10/22/2021   K 4.0 10/22/2021   CO2 23 10/22/2021   GLUCOSE 88 10/22/2021   BUN 20 10/22/2021   CREATININE 1.24 (H) 10/22/2021   CALCIUM 9.7 10/22/2021   EGFR 45 (L) 10/22/2021   GFRNONAA 46 (L) 08/17/2020   Lab Results  Component Value Date   CHOL 197 04/22/2021   HDL 100 04/22/2021   LDLCALC 86 04/22/2021   TRIG 57 04/22/2021   CHOLHDL 2.0 04/22/2021   Lab Results  Component Value Date   TSH 1.220 04/22/2021   Lab Results  Component Value Date   HGBA1C 5.9 (H) 10/22/2021   Lab Results  Component Value Date   WBC 5.2 04/22/2021   HGB 13.0 04/22/2021   HCT 39.4 04/22/2021   MCV 91 04/22/2021   PLT 289 04/22/2021   Lab Results  Component Value Date   ALT 14 04/22/2021   AST 19 04/22/2021   ALKPHOS 87 04/22/2021   BILITOT 0.4 04/22/2021   Lab Results  Component Value Date   VD25OH 58.4 04/22/2021     Review of Systems  Constitutional:  Positive for appetite change (and 6 lb weight loss). Negative for chills, fatigue, fever and unexpected weight change.  HENT:  Negative for congestion, hearing loss, nosebleeds, tinnitus, trouble swallowing and voice change.   Eyes:  Negative for visual disturbance.  Respiratory:  Negative for cough, chest tightness, shortness of breath and wheezing.  Cardiovascular:  Negative for chest pain, palpitations and leg swelling.  Gastrointestinal:  Negative for abdominal pain, constipation, diarrhea and vomiting.  Endocrine: Negative for polydipsia and polyuria.  Genitourinary:  Negative for dysuria, frequency, genital  sores, vaginal bleeding and vaginal discharge.  Musculoskeletal:  Negative for arthralgias, gait problem and joint swelling.  Skin:  Negative for color change and rash.  Neurological:  Negative for dizziness, tremors, weakness, light-headedness and headaches.  Hematological:  Negative for adenopathy. Does not bruise/bleed easily.  Psychiatric/Behavioral:  Negative for dysphoric mood and sleep disturbance. The patient is not nervous/anxious.     Patient Active Problem List   Diagnosis Date Noted   Chronic idiopathic constipation 10/22/2021   Venous stasis dermatitis of both lower extremities 11/08/2019   Ductal carcinoma in situ (DCIS) of left breast 05/21/2017   Localized edema 04/28/2017   Stage 3a chronic kidney disease ([place]) 01/01/2016   Essential (primary) hypertension 07/24/2015   Gastro-esophageal reflux disease without esophagitis 07/24/2015   Mixed hyperlipidemia 07/24/2015   Prediabetes 07/24/2015   Fibrocystic breast changes 07/24/2015   Anxiety 05/24/2012    Allergies  Allergen Reactions   Ace Inhibitors Other (See Comments)    Cough   Hydrochlorothiazide Other (See Comments)    Dizziness   Triamterene-Hctz Other (See Comments)    Appetite loss    Past Surgical History:  Procedure Laterality Date   ABDOMINAL HYSTERECTOMY  1980   BREAST BIOPSY Left 2018   DCIS   BREAST LUMPECTOMY Left 03/2017   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 WITH    BREAST LUMPECTOMY WITH NEEDLE LOCALIZATION Left 04/07/2017   Procedure: BREAST LUMPECTOMY WITH NEEDLE LOCALIZATION;  Surgeon: Vickie Epley, MD;  Location: [facility];  Service: General;  Laterality: Left;   BREAST SURGERY Left    Breast Biopsy   CATARACT EXTRACTION W/PHACO Right 06/12/2016   Procedure: CATARACT EXTRACTION PHACO AND INTRAOCULAR LENS PLACEMENT ([place]);  Surgeon: Eulogio Bear, MD;  Location: [facility];  Service: Ophthalmology;  Laterality: Right;  [place] 6.1 AP% 12.7 CDE .77 FLUID PACK  LOT # [id number]   CATARACT EXTRACTION W/PHACO Left 04/02/2021   Procedure: CATARACT EXTRACTION PHACO AND INTRAOCULAR LENS PLACEMENT ([place]) LEFT MILOOP DIABETIC;  Surgeon: Birder Robson, MD;  Location: [place];  Service: Ophthalmology;  Laterality: Left;  Diabetic - diet controlled 52.81 03:47.0   COLONOSCOPY  08/02/2012   Dr. Donnella Sham - diverticuli otherwise normal   ESOPHAGOGASTRODUODENOSCOPY  08/02/2012   chronic gastritis   EYE SURGERY Right    Cataract Extraction with IOL   OOPHORECTOMY      Social History   Tobacco Use   Smoking status: Never   Smokeless tobacco: Never   Tobacco comments:    smoking cessation materials not required  Vaping Use   Vaping Use: Never used  Substance Use Topics   Alcohol use: No    Alcohol/week: 0.0 standard drinks of alcohol   Drug use: No     Medication list has been reviewed and updated.  Current Meds  Medication Sig   Accu-Chek Softclix Lancets lancets TEST TWICE DAILY   alendronate (FOSAMAX) 70 MG tablet Take 1 tablet (70 mg total) by mouth once a week. Take with a full glass of water on an empty stomach.   anastrozole (ARIMIDEX) 1 MG tablet TAKE 1 TABLET BY MOUTH  DAILY   aspirin 81 MG chewable tablet Chew by mouth daily.   atorvastatin (LIPITOR) 10 MG tablet TAKE 1 TABLET BY MOUTH  DAILY  blood glucose meter kit and supplies Dispense based on patient and insurance preference. Use up to four times daily as needed. (FOR ICD-10 E11.9).   Calcium Carb-Cholecalciferol (CALCIUM 500 + D) 500-125 MG-UNIT TABS Take 2 tablets by mouth daily.   fluocinonide ointment (LIDEX) 3.14 % Apply 1 application topically 2 (two) times daily.   fluticasone (FLONASE) 50 MCG/ACT nasal spray Place 2 sprays into both nostrils daily.   glucose blood (ACCU-CHEK GUIDE) test strip Use as instructed   irbesartan (AVAPRO) 75 MG tablet TAKE 1 TABLET BY MOUTH  DAILY       04/24/2022    9:29 AM 10/22/2021    9:18 AM 04/22/2021    9:15 AM 06/04/2020     1:31 PM  GAD 7 : Generalized Anxiety Score  Nervous, Anxious, on Edge 0 0 0 0  Control/stop worrying 0 0 0 0  Worry too much - different things 0 0 0 0  Trouble relaxing 0 0 0 0  Restless 0 0 0 0  Easily annoyed or irritable 0 0 0 1  Afraid - awful might happen 0 0 0 0  Total GAD 7 Score 0 0 0 1  Anxiety Difficulty Not difficult at all Not difficult at all  Not difficult at all       04/24/2022    9:29 AM  Depression screen PHQ 2/9  Decreased Interest 1  Down, Depressed, Hopeless 0  PHQ - 2 Score 1  Altered sleeping 1  Tired, decreased energy 1  Change in appetite 0  Feeling bad or failure about yourself  0  Trouble concentrating 0  Moving slowly or fidgety/restless 0  Suicidal thoughts 0  PHQ-9 Score 3  Difficult doing work/chores Not difficult at all    BP Readings from Last 3 Encounters:  04/24/22 128/70  11/22/21 (!) 146/83  10/22/21 126/70    Physical Exam Vitals and nursing note reviewed.  Constitutional:      General: She is not in acute distress.    Appearance: She is well-developed.  HENT:     Head: Normocephalic and atraumatic.     Right Ear: Tympanic membrane and ear canal normal.     Left Ear: Tympanic membrane and ear canal normal.     Nose:     Right Sinus: No maxillary sinus tenderness.     Left Sinus: No maxillary sinus tenderness.  Eyes:     General: No scleral icterus.       Right eye: No discharge.        Left eye: No discharge.     Conjunctiva/sclera: Conjunctivae normal.  Neck:     Thyroid: No thyromegaly.     Vascular: No carotid bruit.  Cardiovascular:     Rate and Rhythm: Normal rate and regular rhythm.     Pulses: Normal pulses.     Heart sounds: Normal heart sounds.  Pulmonary:     Effort: Pulmonary effort is normal. No respiratory distress.     Breath sounds: No wheezing.  Chest:  Breasts:    Right: No mass, nipple discharge, skin change or tenderness.     Left: No mass, nipple discharge, skin change or tenderness.   Abdominal:     General: Bowel sounds are normal.     Palpations: Abdomen is soft.     Tenderness: There is no abdominal tenderness.  Musculoskeletal:     Cervical back: Normal range of motion. No erythema.     Right lower leg: No edema.  Left lower leg: No edema.  Lymphadenopathy:     Cervical: No cervical adenopathy.  Skin:    General: Skin is warm and dry.     Findings: No rash.  Neurological:     Mental Status: She is alert and oriented to person, place, and time.     Cranial Nerves: No cranial nerve deficit.     Sensory: No sensory deficit.     Deep Tendon Reflexes: Reflexes are normal and symmetric.  Psychiatric:        Attention and Perception: Attention normal.        Mood and Affect: Mood normal.     Wt Readings from Last 3 Encounters:  04/24/22 127 lb (57.6 kg)  11/22/21 133 lb (60.3 kg)  10/22/21 132 lb 3.2 oz (60 kg)    BP 128/70 (BP Location: Right Arm, Cuff Size: Normal)   Pulse 80   Ht _0  (1.702 m)   Wt 127 lb (57.6 kg)   SpO2 98%   BMI 19.89 kg/m   Assessment and Plan: 1. Annual physical exam Normal exam except for weight loss Encourage intake of more protein and vegetable. Can liberalize carbs to avoid further loss  2. Encounter for screening mammogram for breast cancer Schedule at [facility] Completed 5 year of AI therapy to end next month - MM 3D SCREEN BREAST BILATERAL  3. Essential (primary) hypertension Clinically stable exam with well controlled BP. Tolerating medications without side effects at this time. Pt to continue current regimen and low sodium diet; benefits of regular exercise as able discussed. - CBC with Differential/Platelet - TSH - POCT urinalysis dipstick  4. Prediabetes Continue to limit sweets but liberalize carbs to avoid further weight loss - Comprehensive metabolic panel - Hemoglobin A1c  5. Mixed hyperlipidemia Tolerating statin medication without side effects at this time LDL is at goal of < 70 on current  dose Continue same therapy without change at this time. - Lipid panel  6. Stage 3a chronic kidney disease (HCC) Continuing to monitor - Comprehensive metabolic panel  7. Age-related osteoporosis without current pathological fracture Now on Fosamax with calcium and vitamin D Vitamin D level ordered by Oncology   Partially dictated using Editor, commissioning. Any errors are unintentional.  Halina Maidens, MD [facility]  04/24/2022

## 2022-04-25 LAB — COMPREHENSIVE METABOLIC PANEL
ALT: 16 IU/L (ref 0–32)
AST: 23 IU/L (ref 0–40)
Albumin/Globulin Ratio: 1.4 (ref 1.2–2.2)
Albumin: 4.4 g/dL (ref 3.7–4.7)
Alkaline Phosphatase: 73 IU/L (ref 44–121)
BUN/Creatinine Ratio: 15 (ref 12–28)
BUN: 18 mg/dL (ref 8–27)
Bilirubin Total: 0.4 mg/dL (ref 0.0–1.2)
CO2: 24 mmol/L (ref 20–29)
Calcium: 9.7 mg/dL (ref 8.7–10.3)
Chloride: 100 mmol/L (ref 96–106)
Creatinine, Ser: 1.23 mg/dL — ABNORMAL HIGH (ref 0.57–1.00)
Globulin, Total: 3.1 g/dL (ref 1.5–4.5)
Glucose: 109 mg/dL — ABNORMAL HIGH (ref 70–99)
Potassium: 4.4 mmol/L (ref 3.5–5.2)
Sodium: 138 mmol/L (ref 134–144)
Total Protein: 7.5 g/dL (ref 6.0–8.5)
eGFR: 45 mL/min/{1.73_m2} — ABNORMAL LOW (ref >59)

## 2022-04-25 LAB — CBC WITH DIFFERENTIAL/PLATELET
Basophils Absolute: 0.1 10*3/uL (ref 0.0–0.2)
Basos: 2 %
EOS (ABSOLUTE): 0.2 10*3/uL (ref 0.0–0.4)
Eos: 5 %
Hematocrit: 38.2 % (ref 34.0–46.6)
Hemoglobin: 12.5 g/dL (ref 11.1–15.9)
Immature Grans (Abs): 0 10*3/uL (ref 0.0–0.1)
Immature Granulocytes: 0 %
Lymphocytes Absolute: 0.8 10*3/uL (ref 0.7–3.1)
Lymphs: 18 %
MCH: 29.9 pg (ref 26.6–33.0)
MCHC: 32.7 g/dL (ref 31.5–35.7)
MCV: 91 fL (ref 79–97)
Monocytes Absolute: 0.4 10*3/uL (ref 0.1–0.9)
Monocytes: 8 %
Neutrophils Absolute: 3 10*3/uL (ref 1.4–7.0)
Neutrophils: 67 %
Platelets: 287 10*3/uL (ref 150–450)
RBC: 4.18 x10E6/uL (ref 3.77–5.28)
RDW: 13.3 % (ref 11.7–15.4)
WBC: 4.4 10*3/uL (ref 3.4–10.8)

## 2022-04-25 LAB — TSH: TSH: 1.38 u[IU]/mL (ref 0.450–4.500)

## 2022-04-25 LAB — HEMOGLOBIN A1C
Est. average glucose Bld gHb Est-mCnc: 128 mg/dL
Hgb A1c MFr Bld: 6.1 % — ABNORMAL HIGH (ref 4.8–5.6)

## 2022-04-25 LAB — LIPID PANEL
Chol/HDL Ratio: 2.1 ratio (ref 0.0–4.4)
Cholesterol, Total: 172 mg/dL (ref 100–199)
HDL: 83 mg/dL (ref >39)
LDL Chol Calc (NIH): 78 mg/dL (ref 0–99)
Triglycerides: 55 mg/dL (ref 0–149)
VLDL Cholesterol Cal: 11 mg/dL (ref 5–40)

## 2022-04-28 ENCOUNTER — Other Ambulatory Visit: Payer: Self-pay | Admitting: Oncology

## 2022-05-01 ENCOUNTER — Other Ambulatory Visit: Payer: Self-pay | Admitting: Internal Medicine

## 2022-05-01 ENCOUNTER — Other Ambulatory Visit: Payer: Self-pay | Admitting: Oncology

## 2022-05-01 DIAGNOSIS — I10 Essential (primary) hypertension: Secondary | ICD-10-CM

## 2022-05-07 ENCOUNTER — Ambulatory Visit (INDEPENDENT_AMBULATORY_CARE_PROVIDER_SITE_OTHER): Payer: Medicare Other

## 2022-05-07 DIAGNOSIS — Z0000 Encounter for general adult medical examination without abnormal findings: Secondary | ICD-10-CM | POA: Diagnosis not present

## 2022-05-07 NOTE — Progress Notes (Signed)
Subjective:   Brandy Diaz is a 76 y.o. female who presents for Medicare Annual (Subsequent) preventive examination.  Virtual Visit via Telephone Note  I connected with  Brandy Diaz on 05/07/22 at 10:15 AM EDT by telephone and verified that I am speaking with the correct person using two identifiers.  Location: Patient: home Provider: [facility] Persons participating in the virtual visit: [place]   I discussed the limitations, risks, security and privacy concerns of performing an evaluation and management service by telephone and the availability of in person appointments. The patient expressed understanding and agreed to proceed.  Interactive audio and video telecommunications were attempted between this nurse and patient, however failed, due to patient having technical difficulties OR patient did not have access to video capability.  We continued and completed visit with audio only.  Some vital signs may be absent or patient reported.   Clemetine Marker, LPN   Review of Systems     Cardiac Risk Factors include: advanced age (>70mn, >>69women);dyslipidemia;hypertension     Objective:    There were no vitals filed for this visit. There is no height or weight on file to calculate BMI.     05/07/2022   10:21 AM 11/22/2021    2:17 PM 08/30/2021   11:08 AM 05/06/2021   10:15 AM 04/02/2021    6:45 AM 03/01/2021   11:38 AM 08/17/2020    9:55 AM  Advanced Directives  Does Patient Have a Medical Advance Directive? No No No No No No No  Would patient like information on creating a medical advance directive? No - Patient declined No - Patient declined No - Patient declined No - Patient declined No - Patient declined      Current Medications (verified) Outpatient Encounter Medications as of 05/07/2022  Medication Sig   Accu-Chek Softclix Lancets lancets TEST TWICE DAILY   alendronate (FOSAMAX) 70 MG tablet TAKE 1 TABLET BY MOUTH WEEKLY  WITH A FULL GLASS OF WATER  ON AN EMPTY STOMACH   aspirin 81 MG chewable tablet Chew by mouth daily.   atorvastatin (LIPITOR) 10 MG tablet TAKE 1 TABLET BY MOUTH  DAILY   blood glucose meter kit and supplies Dispense based on patient and insurance preference. Use up to four times daily as needed. (FOR ICD-10 E11.9).   Calcium Carb-Cholecalciferol (CALCIUM 500 + D) 500-125 MG-UNIT TABS Take 2 tablets by mouth daily.   fluocinonide ointment (LIDEX) 04.19% Apply 1 application topically 2 (two) times daily.   fluticasone (FLONASE) 50 MCG/ACT nasal spray Place 2 sprays into both nostrils daily.   glucose blood (ACCU-CHEK GUIDE) test strip Use as instructed   irbesartan (AVAPRO) 75 MG tablet TAKE 1 TABLET BY MOUTH DAILY   [DISCONTINUED] anastrozole (ARIMIDEX) 1 MG tablet TAKE 1 TABLET BY MOUTH  DAILY   No facility-administered encounter medications on file as of 05/07/2022.    Allergies (verified) Ace inhibitors, Hydrochlorothiazide, and Triamterene-hctz   History: Past Medical History:  Diagnosis Date   Anemia    [place]   [place]   Diabetes mellitus [place] complication [place]    diet controlled   Edema    ankles   GERD (gastroesophageal reflux disease)    Hypertension    Mood disorder [place] 08/21/2020   Unexplained weight loss 07/24/2015   EGD and Colonoscopy normal 2014    Wears dentures    Full lower, partial upper   Past Surgical History:  Procedure Laterality Date   ABDOMINAL HYSTERECTOMY  1980   BREAST BIOPSY Left 2018   DCIS   BREAST LUMPECTOMY Left 03/2017   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 WITH    BREAST LUMPECTOMY WITH NEEDLE LOCALIZATION Left 04/07/2017   Procedure: BREAST LUMPECTOMY WITH NEEDLE LOCALIZATION;  Surgeon: Vickie Epley, MD;  Location: [facility] ORS;  Service: General;  Laterality: Left;   BREAST SURGERY Left    Breast Biopsy   CATARACT EXTRACTION W/PHACO Right 06/12/2016   Procedure: CATARACT EXTRACTION PHACO AND INTRAOCULAR LENS  PLACEMENT ([place]);  Surgeon: Eulogio Bear, MD;  Location: [facility] OR[facility];  Service: Ophthalmology;  Laterality: Right;  [place] 6.1 AP% 12.7 CDE .77 FLUID PACK LOT # [id number]   CATARACT EXTRACTION W/PHACO Left 04/02/2021   Procedure: CATARACT EXTRACTION PHACO AND INTRAOCULAR LENS PLACEMENT ([place]) LEFT MILOOP DIABETIC;  Surgeon: Birder Robson, MD;  Location: [employer];  Service: Ophthalmology;  Laterality: Left;  Diabetic - diet controlled 52.81 03:47.0   COLONOSCOPY  08/02/2012   Dr. Donnella Sham - diverticuli otherwise normal   ESOPHAGOGASTRODUODENOSCOPY  08/02/2012   chronic gastritis   Diaz SURGERY Right    Cataract Extraction with IOL   OOPHORECTOMY     Family History  Problem Relation Age of Onset   Breast cancer Mother 91   Diabetes Mother    Hypertension Mother    CAD Father    Breast cancer Sister    Hypertension Sister    Breast cancer Maternal Aunt 26   Throat cancer Brother    Social History   Socioeconomic History   Marital status: Married    Spouse name: Not on file   Number of children: 0   Years of education: Not on file   Highest education level: 10th grade  Occupational History   Occupation: Retired  Tobacco Use   Smoking status: Never   Smokeless tobacco: Never   Tobacco comments:    smoking cessation materials not required  Vaping Use   Vaping Use: Never used  Substance and Sexual Activity   Alcohol use: No    Alcohol/week: 0.0 standard drinks of alcohol   Drug use: No   Sexual activity: Not Currently  Other Topics Concern   Not on file  Social History Narrative   Not on file   Social Determinants of Health   Financial Resource Strain: Low Risk  (05/07/2022)   Overall Financial Resource Strain (CARDIA)    Difficulty of Paying Living Expenses: Not very hard  Food Insecurity: No Food Insecurity (05/07/2022)   Hunger Vital Sign    Worried About Running Out of Food in the Last Year: Never true    Ran Out of Food in the Last Year: Never true   Transportation Needs: No Transportation Needs (05/07/2022)   PRAPARE - Hydrologist (Medical): No    Lack of Transportation (Non-Medical): No  Physical Activity: Inactive (05/07/2022)   Exercise Vital Sign    Days of Exercise per Week: 0 days    Minutes of Exercise per Session: 0 min  Stress: No Stress Concern Present (05/07/2022)   [place]    Feeling of Stress : Not at all  Social Connections: Moderately Integrated (05/07/2022)   Social Connection and Isolation Panel [NHANES]    Frequency of Communication with Friends and Family: More than three times a week    Frequency of Social Gatherings with Friends and Family: Twice a week    Attends Religious Services: More  than 4 times per year    Active Member of [place] or Organizations: No    Attends Archivist Meetings: Never    Marital Status: Married    Tobacco Counseling Counseling given: Not Answered Tobacco comments: smoking cessation materials not required   Clinical Intake:  Pre-visit preparation completed: Yes  Pain : No/denies pain     Nutritional Risks: None Diabetes: No  How often do you need to have someone help you when you read instructions, pamphlets, or other written materials from your doctor or pharmacy?: 1 - Never   Interpreter Needed?: No  Information entered by :: Clemetine Marker LPN   Activities of Daily Living    05/07/2022   10:22 AM 10/22/2021    9:19 AM  In your present state of health, do you have any difficulty performing the following activities:  Hearing? 0 1  Vision? 0 0  Difficulty concentrating or making decisions? 0 0  Walking or climbing stairs? 0 0  Dressing or bathing? 0 0  Doing errands, shopping? 0 0  Preparing Food and eating ? N   Using the Toilet? N   In the past six months, have you accidently leaked urine? N   Do you have problems with loss of bowel control? N   Managing  your Medications? N   Managing your Finances? N   Housekeeping or managing your Housekeeping? N     Patient Care Team: Glean Hess, MD as PCP - General (Internal Medicine) Sindy Guadeloupe, MD as Consulting Physician (Oncology) Eulogio Bear, MD as Consulting Physician (Ophthalmology) Rory Percy, MD as Referring Physician (Dermatology)  Indicate any recent Medical Services you may have received from other than [facility] providers in the past year (date may be approximate).     Assessment:   This is a routine wellness examination for Brandy Diaz.  Hearing/Vision screen Hearing Screening - Comments:: Pt has mild hearing difficulty in left ear Vision Screening - Comments:: Annual vision screenings [facility] at [facility]   Dietary issues and exercise activities discussed: Current Exercise Habits: The patient does not participate in regular exercise at present, Exercise limited by: None identified   Goals Addressed             This Visit's Progress    DIET - INCREASE WATER INTAKE   On track    Recommend to drink at least 6-8 8oz glasses of water per day.       Depression Screen    05/07/2022   10:20 AM 04/24/2022    9:29 AM 10/22/2021    9:18 AM 05/06/2021   10:10 AM 04/22/2021    9:14 AM 06/04/2020    1:30 PM 05/21/2020    9:57 AM  PHQ 2/9 Scores  PHQ - 2 Score 2 1 0 0 0 0 0  PHQ- 9 Score 4 3 0  2 0 0    Fall Risk    05/07/2022   10:22 AM 04/24/2022    9:30 AM 10/22/2021    9:19 AM 05/06/2021   10:15 AM 04/22/2021    9:15 AM  Fall Risk   Falls in the past year? 0 0 0 0 0  Number falls in past yr: 0 0 0 0   Injury with Fall? 0 0 0 0   Risk for fall due to : No Fall Risks No Fall Risks No Fall Risks No Fall Risks   Follow up Falls prevention discussed Falls evaluation completed Falls evaluation completed Falls  prevention discussed Falls evaluation completed    FALL RISK PREVENTION PERTAINING TO THE HOME:  Any stairs in or around the home? Yes  If so, are  there any without handrails? No  Home free of loose throw rugs in walkways, pet beds, electrical cords, etc? Yes  Adequate lighting in your home to reduce risk of falls? Yes   ASSISTIVE DEVICES UTILIZED TO PREVENT FALLS:  Life alert? No  Use of a cane, walker or w/c? No  Grab bars in the bathroom? No  Shower chair or bench in shower? No  Elevated toilet seat or a handicapped toilet? No   TIMED UP AND GO:  Was the test performed? No . Telephonic visit   Cognitive Function: Normal cognitive status assessed by direct observation by this Nurse Health Advisor. No abnormalities found.          04/11/2020    2:51 PM 04/11/2019    1:41 PM 04/07/2018    1:53 PM 01/19/2017   10:47 AM  6CIT Screen  What Year? 0 points 0 points 0 points 0 points  What month? 0 points 0 points 0 points 0 points  What time? 0 points 0 points 0 points 0 points  Count back from 20 0 points 0 points 0 points 0 points  Months in reverse 0 points 0 points 0 points 2 points  Repeat phrase 4 points 6 points 4 points 0 points  Total Score 4 points 6 points 4 points 2 points    Immunizations Immunization History  Administered Date(s) Administered   Fluad Quad(high Dose 65+) 08/21/2020   Influenza Nasal 08/12/2019   Influenza Split 08/24/2017   Influenza, High Dose Seasonal PF 08/18/2017, 08/16/2018, 08/12/2019   Influenza-Unspecified 08/24/2016, 08/24/2017, 08/08/2021   Moderna Sars-Covid-2 Vaccination 02/20/2020, 03/21/2020   PFIZER Comirnaty(Gray Top)Covid-19 Tri-Sucrose Vaccine 10/08/2020   Pneumococcal Conjugate-13 12/27/2014   Pneumococcal Polysaccharide-23 11/11/2012, 05/05/2013, 05/05/2013   Tdap 01/20/2013, 05/11/2013    TDAP status: Up to date  Flu Vaccine status: Up to date  Pneumococcal vaccine status: Up to date  Covid-19 vaccine status: Completed vaccines  Qualifies for Shingles Vaccine? Yes   Zostavax completed No   Shingrix Completed?: No.    Education has been provided regarding the  importance of this vaccine. Patient has been advised to call insurance company to determine out of pocket expense if they have not yet received this vaccine. Advised may also receive vaccine at local pharmacy or Health Dept. Verbalized acceptance and understanding.  Screening Tests Health Maintenance  Topic Date Due   Zoster Vaccines- Shingrix (1 of 2) Never done   COVID-19 Vaccine (4 - Booster) 12/03/2020   INFLUENZA VACCINE  06/10/2022   MAMMOGRAM  09/30/2022   TETANUS/TDAP  05/12/2023   Pneumonia Vaccine 54+ Years old  Completed   DEXA SCAN  Completed   Hepatitis C Screening  Completed   HPV VACCINES  Aged Out   COLONOSCOPY (Pts 45-6yr Insurance coverage will need to be confirmed)  Discontinued    Health Maintenance  Health Maintenance Due  Topic Date Due   Zoster Vaccines- Shingrix (1 of 2) Never done   COVID-19 Vaccine (4 - Booster) 12/03/2020    Colorectal cancer screening: No longer required.   Mammogram status: Completed 09/30/21. Repeat every year  Bone Density status: Completed 09/30/21. Results reflect: Bone density results: OSTEOPOROSIS. Repeat every 2 years.  Lung Cancer Screening: (Low Dose CT Chest recommended if Age 77-80years, 30 pack-year currently smoking OR have quit w/in 15years.) does not  qualify.   Additional Screening:  Hepatitis C Screening: does qualify; Completed 04/22/21  Vision Screening: Recommended annual ophthalmology exams for early detection of glaucoma and other disorders of the Diaz. Is the patient up to date with their annual Diaz exam?  Yes  Who is the provider or what is the name of the office in which the patient attends annual Diaz exams? [facility].   Dental Screening: Recommended annual dental exams for proper oral hygiene  Community Resource Referral / Chronic Care Management: CRR required this visit?  No   CCM required this visit?  No      Plan:     I have personally reviewed and noted the following in the  patient's chart:   Medical and social history Use of alcohol, tobacco or illicit drugs  Current medications and supplements including opioid prescriptions.  Functional ability and status Nutritional status Physical activity Advanced directives List of other physicians Hospitalizations, surgeries, and ER visits in previous 12 months Vitals Screenings to include cognitive, depression, and falls Referrals and appointments  In addition, I have reviewed and discussed with patient certain preventive protocols, quality metrics, and best practice recommendations. A written personalized care plan for preventive services as well as general preventive health recommendations were provided to patient.     Clemetine Marker, LPN   [id number]/81/[id number]   Nurse Notes: none

## 2022-05-07 NOTE — Patient Instructions (Signed)
Brandy Diaz , Thank you for taking time to come for your Medicare Wellness Visit. I appreciate your ongoing commitment to your health goals. Please review the following plan we discussed and let me know if I can assist you in the future.   Screening recommendations/referrals: Colonoscopy: no longer required Mammogram: done 09/30/21. Scheduled for 10/01/22 Bone Density: done 09/30/21 Recommended yearly ophthalmology/optometry visit for glaucoma screening and checkup Recommended yearly dental visit for hygiene and checkup  Vaccinations: Influenza vaccine: done 08/08/21 Pneumococcal vaccine: done 12/27/14 Tdap vaccine: done 05/11/13 Shingles vaccine: Shingrix discussed. Please contact your pharmacy for coverage information.  Covid-19:done 02/20/20, 03/21/20 & 10/08/20  Advanced directives: Advance directive discussed with you today. Even though you declined this today please call our office should you change your mind and we can give you the proper paperwork for you to fill out.   Conditions/risks identified: Keep up the great work!  Next appointment: Follow up in one year for your annual wellness visit    Preventive Care 77 Years and Older, Female Preventive care refers to lifestyle choices and visits with your health care provider that can promote health and wellness. What does preventive care include? A yearly physical exam. This is also called an annual well check. Dental exams once or twice a year. Routine eye exams. Ask your health care provider how often you should have your eyes checked. Personal lifestyle choices, including: Daily care of your teeth and gums. Regular physical activity. Eating a healthy diet. Avoiding tobacco and drug use. Limiting alcohol use. Practicing safe sex. Taking low-dose aspirin every day. Taking vitamin and mineral supplements as recommended by your health care provider. What happens during an annual well check? The services and screenings done by  your health care provider during your annual well check will depend on your age, overall health, lifestyle risk factors, and family history of disease. Counseling  Your health care provider may ask you questions about your: Alcohol use. Tobacco use. Drug use. Emotional well-being. Home and relationship well-being. Sexual activity. Eating habits. History of falls. Memory and ability to understand (cognition). Work and work Statistician. Reproductive health. Screening  You may have the following tests or measurements: Height, weight, and BMI. Blood pressure. Lipid and cholesterol levels. These may be checked every 5 years, or more frequently if you are over 49 years old. Skin check. Lung cancer screening. You may have this screening every year starting at age 77 if you have a 30-pack-year history of smoking and currently smoke or have quit within the past 15 years. Fecal occult blood test (FOBT) of the stool. You may have this test every year starting at age 77. Flexible sigmoidoscopy or colonoscopy. You may have a sigmoidoscopy every 5 years or a colonoscopy every 10 years starting at age 77. Hepatitis C blood test. Hepatitis B blood test. Sexually transmitted disease (STD) testing. Diabetes screening. This is done by checking your blood sugar (glucose) after you have not eaten for a while (fasting). You may have this done every 1-3 years. Bone density scan. This is done to screen for osteoporosis. You may have this done starting at age 77. Mammogram. This may be done every 1-2 years. Talk to your health care provider about how often you should have regular mammograms. Talk with your health care provider about your test results, treatment options, and if necessary, the need for more tests. Vaccines  Your health care provider may recommend certain vaccines, such as: Influenza vaccine. This is recommended every year. Tetanus, diphtheria,  and acellular pertussis (Tdap, Td) vaccine. You  may need a Td booster every 10 years. Zoster vaccine. You may need this after age 77. Pneumococcal 13-valent conjugate (PCV13) vaccine. One dose is recommended after age 77. Pneumococcal polysaccharide (PPSV23) vaccine. One dose is recommended after age 77. Talk to your health care provider about which screenings and vaccines you need and how often you need them. This information is not intended to replace advice given to you by your health care provider. Make sure you discuss any questions you have with your health care provider. Document Released: 11/23/2015 Document Revised: 07/16/2016 Document Reviewed: 08/28/2015 Elsevier Interactive Patient Education  2017 [place] Prevention in the Home Falls can cause injuries. They can happen to people of all ages. There are many things you can do to make your home safe and to help prevent falls. What can I do on the outside of my home? Regularly fix the edges of walkways and driveways and fix any cracks. Remove anything that might make you trip as you walk through a door, such as a raised step or threshold. Trim any bushes or trees on the path to your home. Use bright outdoor lighting. Clear any walking paths of anything that might make someone trip, such as rocks or tools. Regularly check to see if handrails are loose or broken. Make sure that both sides of any steps have handrails. Any raised decks and porches should have guardrails on the edges. Have any leaves, snow, or ice cleared regularly. Use sand or salt on walking paths during winter. Clean up any spills in your garage right away. This includes oil or grease spills. What can I do in the bathroom? Use night lights. Install grab bars by the toilet and in the tub and shower. Do not use towel bars as grab bars. Use non-skid mats or decals in the tub or shower. If you need to sit down in the shower, use a plastic, non-slip stool. Keep the floor dry. Clean up any water that spills  on the floor as soon as it happens. Remove soap buildup in the tub or shower regularly. Attach bath mats securely with double-sided non-slip rug tape. Do not have throw rugs and other things on the floor that can make you trip. What can I do in the bedroom? Use night lights. Make sure that you have a light by your bed that is easy to reach. Do not use any sheets or blankets that are too big for your bed. They should not hang down onto the floor. Have a firm chair that has side arms. You can use this for support while you get dressed. Do not have throw rugs and other things on the floor that can make you trip. What can I do in the kitchen? Clean up any spills right away. Avoid walking on wet floors. Keep items that you use a lot in easy-to-reach places. If you need to reach something above you, use a strong step stool that has a grab bar. Keep electrical cords out of the way. Do not use floor polish or wax that makes floors slippery. If you must use wax, use non-skid floor wax. Do not have throw rugs and other things on the floor that can make you trip. What can I do with my stairs? Do not leave any items on the stairs. Make sure that there are handrails on both sides of the stairs and use them. Fix handrails that are broken or loose. Make sure  that handrails are as long as the stairways. Check any carpeting to make sure that it is firmly attached to the stairs. Fix any carpet that is loose or worn. Avoid having throw rugs at the top or bottom of the stairs. If you do have throw rugs, attach them to the floor with carpet tape. Make sure that you have a light switch at the top of the stairs and the bottom of the stairs. If you do not have them, ask someone to add them for you. What else can I do to help prevent falls? Wear shoes that: Do not have high heels. Have rubber bottoms. Are comfortable and fit you well. Are closed at the toe. Do not wear sandals. If you use a stepladder: Make  sure that it is fully opened. Do not climb a closed stepladder. Make sure that both sides of the stepladder are locked into place. Ask someone to hold it for you, if possible. Clearly mark and make sure that you can see: Any grab bars or handrails. First and last steps. Where the edge of each step is. Use tools that help you move around (mobility aids) if they are needed. These include: Canes. Walkers. Scooters. Crutches. Turn on the lights when you go into a dark area. Replace any light bulbs as soon as they burn out. Set up your furniture so you have a clear path. Avoid moving your furniture around. If any of your floors are uneven, fix them. If there are any pets around you, be aware of where they are. Review your medicines with your doctor. Some medicines can make you feel dizzy. This can increase your chance of falling. Ask your doctor what other things that you can do to help prevent falls. This information is not intended to replace advice given to you by your health care provider. Make sure you discuss any questions you have with your health care provider. Document Released: 08/23/2009 Document Revised: 04/03/2016 Document Reviewed: 12/01/2014 Elsevier Interactive Patient Education  2017 [employer].

## 2022-05-20 ENCOUNTER — Other Ambulatory Visit: Payer: Self-pay | Admitting: Internal Medicine

## 2022-05-20 DIAGNOSIS — E782 Mixed hyperlipidemia: Secondary | ICD-10-CM

## 2022-05-21 NOTE — Telephone Encounter (Signed)
Requested Prescriptions  Pending Prescriptions Disp Refills  . atorvastatin (LIPITOR) 10 MG tablet [Pharmacy Med Name: Atorvastatin Calcium 10 MG Oral Tablet] 100 tablet 0    Sig: TAKE 1 TABLET BY MOUTH DAILY     Cardiovascular:  Antilipid - Statins Failed - 05/20/2022 11:28 PM      Failed - Lipid Panel in normal range within the last 12 months    Cholesterol, Total  Date Value Ref Range Status  04/24/2022 172 100 - 199 mg/dL Final   Cholesterol  Date Value Ref Range Status  02/14/2012 218 (H) 0 - 200 mg/dL Final   Ldl Cholesterol, Calc  Date Value Ref Range Status  02/14/2012 118 (H) 0 - 100 mg/dL Final   LDL Chol Calc (NIH)  Date Value Ref Range Status  04/24/2022 78 0 - 99 mg/dL Final   HDL Cholesterol  Date Value Ref Range Status  02/14/2012 87 (H) 40 - 60 mg/dL Final   HDL  Date Value Ref Range Status  04/24/2022 83 >39 mg/dL Final   Triglycerides  Date Value Ref Range Status  04/24/2022 55 0 - 149 mg/dL Final  02/14/2012 64 0 - 200 mg/dL Final         Passed - Patient is not pregnant      Passed - Valid encounter within last 12 months    Recent Outpatient Visits          3 weeks ago Annual physical exam   [facility] Glean Hess, MD   7 months ago Essential (primary) hypertension   [facility] Glean Hess, MD   1 year ago Annual physical exam   [facility] Glean Hess, MD   1 year ago Prediabetes   [facility] Glean Hess, MD   1 year ago Venous stasis dermatitis of both lower extremities   [facility] Glean Hess, MD

## 2022-05-22 ENCOUNTER — Telehealth: Payer: Self-pay | Admitting: Oncology

## 2022-05-22 NOTE — Telephone Encounter (Signed)
Pt called in to see if her appts for tomorrow were still scheduled,pt has been contacted and I have confirmed the times of appts

## 2022-05-23 ENCOUNTER — Inpatient Hospital Stay: Payer: Medicare Other | Attending: Oncology

## 2022-05-23 ENCOUNTER — Inpatient Hospital Stay (HOSPITAL_BASED_OUTPATIENT_CLINIC_OR_DEPARTMENT_OTHER): Payer: Medicare Other | Admitting: Oncology

## 2022-05-23 ENCOUNTER — Encounter: Payer: Self-pay | Admitting: Oncology

## 2022-05-23 ENCOUNTER — Telehealth: Payer: Self-pay | Admitting: *Deleted

## 2022-05-23 VITALS — BP 134/85 | HR 146 | Temp 98.7°F | Resp 16 | Ht 67.0 in | Wt 129.9 lb

## 2022-05-23 DIAGNOSIS — Z8249 Family history of ischemic heart disease and other diseases of the circulatory system: Secondary | ICD-10-CM | POA: Diagnosis not present

## 2022-05-23 DIAGNOSIS — Z833 Family history of diabetes mellitus: Secondary | ICD-10-CM | POA: Insufficient documentation

## 2022-05-23 DIAGNOSIS — M810 Age-related osteoporosis without current pathological fracture: Secondary | ICD-10-CM | POA: Insufficient documentation

## 2022-05-23 DIAGNOSIS — E119 Type 2 diabetes mellitus without complications: Secondary | ICD-10-CM | POA: Insufficient documentation

## 2022-05-23 DIAGNOSIS — R000 Tachycardia, unspecified: Secondary | ICD-10-CM

## 2022-05-23 DIAGNOSIS — Z803 Family history of malignant neoplasm of breast: Secondary | ICD-10-CM | POA: Insufficient documentation

## 2022-05-23 DIAGNOSIS — I10 Essential (primary) hypertension: Secondary | ICD-10-CM | POA: Insufficient documentation

## 2022-05-23 DIAGNOSIS — Z79899 Other long term (current) drug therapy: Secondary | ICD-10-CM | POA: Insufficient documentation

## 2022-05-23 DIAGNOSIS — R609 Edema, unspecified: Secondary | ICD-10-CM | POA: Diagnosis not present

## 2022-05-23 DIAGNOSIS — D0512 Intraductal carcinoma in situ of left breast: Secondary | ICD-10-CM

## 2022-05-23 DIAGNOSIS — Z808 Family history of malignant neoplasm of other organs or systems: Secondary | ICD-10-CM | POA: Insufficient documentation

## 2022-05-23 DIAGNOSIS — M8580 Other specified disorders of bone density and structure, unspecified site: Secondary | ICD-10-CM | POA: Diagnosis not present

## 2022-05-23 DIAGNOSIS — R5383 Other fatigue: Secondary | ICD-10-CM | POA: Insufficient documentation

## 2022-05-23 DIAGNOSIS — K219 Gastro-esophageal reflux disease without esophagitis: Secondary | ICD-10-CM | POA: Diagnosis not present

## 2022-05-23 DIAGNOSIS — Z90721 Acquired absence of ovaries, unilateral: Secondary | ICD-10-CM | POA: Insufficient documentation

## 2022-05-23 DIAGNOSIS — Z5181 Encounter for therapeutic drug level monitoring: Secondary | ICD-10-CM

## 2022-05-23 LAB — VITAMIN D 25 HYDROXY (VIT D DEFICIENCY, FRACTURES): Vit D, 25-Hydroxy: 40.97 ng/mL (ref 30–100)

## 2022-05-23 NOTE — Telephone Encounter (Signed)
Dr. Janese Banks wanted me to check with pt. One more time and if it was better and the rhythm then it is ok to leave and if not she will need to go to ER. I put pulse ox on and it was 102 to 103 with a normal rhythm.  So pt can go home and pt was told that she feels a burning pain or feels like Heart rate elevated to please to to ER. Pt agreeable to the plan

## 2022-05-23 NOTE — Progress Notes (Unsigned)
Pt feels like burning sensation coming up her throat. She had ribs yest. And she is feeling this. B/p 134/85, pulse was 150  and I put her on pulse ox and it came down to 146 but the pulse ox was up and down, not a clear look to know if she is having any abnormal rythem

## 2022-05-25 NOTE — Progress Notes (Signed)
Hematology/Oncology Consult note [facility]  [phone] [phone]  Patient Care Team: Glean Hess, MD as PCP - General (Internal Medicine) Sindy Guadeloupe, MD as Consulting Physician (Oncology) Eulogio Bear, MD as Consulting Physician (Ophthalmology) Rory Percy, MD as Referring Physician (Dermatology)   Name of the patient: Brandy Diaz  [id number]  1946/09/18   Date of visit: 05/25/22  Diagnosis- left breast ER+ DCIS  Chief complaint/ Reason for visit- routine f/u of left breast dcis  Heme/Onc history: Patient is a 76 year old female with a past medical history significant for hypertension and diet-controlled diabetes who underwent screening mammogram which showed an abnormal mass in the left breast 3 cm from the nipple 1 x 0.9 x 0.3 cm in size and mobile mass at 4:00 position 0.8 x 0.7 x 0.5 cm in size. Evaluation of axilla was negative for adenopathy. This was followed by ultrasound and the mass at the 5:30 position was stable long-term and consistent with benign lesion and core biopsy of the 4:00 position mass was recommended.   2. 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    3. 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 No necrosis was identified. Margins were negative for DCIS 8 mm from the deep margin. ER 5190% positive PR negative   4. Bone density scan in June 2018 showed T score of -2.2 consistent with osteopenia. 10 year probability of a major osteoporotic fracture was 6% and hip fracture was 1.5%. Patient started on arimidex in June 2018      Interval history-patient had a sense of palpitations before she came to our clinic but presently feels better.  Denies any specific complaints at this time  ECOG PS- 1 Pain scale- 0   Review of systems- Review  of Systems  Constitutional:  Positive for malaise/fatigue. Negative for chills, fever and weight loss.  HENT:  Negative for congestion, ear discharge and nosebleeds.   Eyes:  Negative for blurred vision.  Respiratory:  Negative for cough, hemoptysis, sputum production, shortness of breath and wheezing.   Cardiovascular:  Negative for chest pain, palpitations, orthopnea and claudication.  Gastrointestinal:  Negative for abdominal pain, blood in stool, constipation, diarrhea, heartburn, melena, nausea and vomiting.  Genitourinary:  Negative for dysuria, flank pain, frequency, hematuria and urgency.  Musculoskeletal:  Negative for back pain, joint pain and myalgias.  Skin:  Negative for rash.  Neurological:  Negative for dizziness, tingling, focal weakness, seizures, weakness and headaches.  Endo/Heme/Allergies:  Does not bruise/bleed easily.  Psychiatric/Behavioral:  Negative for depression and suicidal ideas. The patient does not have insomnia.       Allergies  Allergen Reactions   Ace Inhibitors Other (See Comments)    Cough   Hydrochlorothiazide Other (See Comments)    Dizziness   Triamterene-Hctz Other (See Comments)    Appetite loss     Past Medical History:  Diagnosis Date   Anemia    Breast cancer ([place]) 2018   Left   Diabetes mellitus without complication ([place])    diet controlled   Edema    ankles   GERD (gastroesophageal reflux disease)    Hypertension    Mood disorder ([place]) 08/21/2020   Unexplained weight loss 07/24/2015   EGD and Colonoscopy normal 2014    Wears dentures    Full lower, partial upper  Past Surgical History:  Procedure Laterality Date   ABDOMINAL HYSTERECTOMY  1980   BREAST BIOPSY Left 2018   DCIS   BREAST LUMPECTOMY Left 03/2017   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 WITH    BREAST LUMPECTOMY WITH NEEDLE LOCALIZATION Left 04/07/2017   Procedure: BREAST LUMPECTOMY WITH NEEDLE LOCALIZATION;  Surgeon: Vickie Epley, MD;  Location: [facility] ORS;  Service: General;  Laterality: Left;   BREAST SURGERY Left    Breast Biopsy   CATARACT EXTRACTION W/PHACO Right 06/12/2016   Procedure: CATARACT EXTRACTION PHACO AND INTRAOCULAR LENS PLACEMENT ([place]);  Surgeon: Eulogio Bear, MD;  Location: [facility] ORS;  Service: Ophthalmology;  Laterality: Right;  [place] 6.1 AP% 12.7 CDE .77 FLUID PACK LOT # [id number]   CATARACT EXTRACTION W/PHACO Left 04/02/2021   Procedure: CATARACT EXTRACTION PHACO AND INTRAOCULAR LENS PLACEMENT ([place]) LEFT MILOOP DIABETIC;  Surgeon: Birder Robson, MD;  Location: [place];  Service: Ophthalmology;  Laterality: Left;  Diabetic - diet controlled 52.81 03:47.0   COLONOSCOPY  08/02/2012   Dr. Donnella Sham - diverticuli otherwise normal   ESOPHAGOGASTRODUODENOSCOPY  08/02/2012   chronic gastritis   EYE SURGERY Right    Cataract Extraction with IOL   OOPHORECTOMY      Social History   Socioeconomic History   Marital status: Married    Spouse name: Not on file   Number of children: 0   Years of education: Not on file   Highest education level: 10th grade  Occupational History   Occupation: Retired  Tobacco Use   Smoking status: Never   Smokeless tobacco: Never   Tobacco comments:    smoking cessation materials not required  Vaping Use   Vaping Use: Never used  Substance and Sexual Activity   Alcohol use: No    Alcohol/week: 0.0 standard drinks of alcohol   Drug use: No   Sexual activity: Not Currently  Other Topics Concern   Not on file  Social History Narrative   Not on file   Social Determinants of Health   Financial Resource Strain: Low Risk  (05/07/2022)   Overall Financial Resource Strain (CARDIA)    Difficulty of Paying Living Expenses: Not very hard  Food Insecurity: No Food Insecurity (05/07/2022)   Hunger Vital Sign    Worried About Running Out of Food in the Last Year: Never true    Ran Out of Food in the Last Year: Never true  Transportation Needs: No  Transportation Needs (05/07/2022)   PRAPARE - Hydrologist (Medical): No    Lack of Transportation (Non-Medical): No  Physical Activity: Inactive (05/07/2022)   Exercise Vital Sign    Days of Exercise per Week: 0 days    Minutes of Exercise per Session: 0 min  Stress: No Stress Concern Present (05/07/2022)   [place]    Feeling of Stress : Not at all  Social Connections: Moderately Integrated (05/07/2022)   Social Connection and Isolation Panel [NHANES]    Frequency of Communication with Friends and Family: More than three times a week    Frequency of Social Gatherings with Friends and Family: Twice a week    Attends Religious Services: More than 4 times per year    Active Member of [employer] or Organizations: No    Attends Archivist Meetings: Never    Marital Status: Married  Human resources officer Violence: Not At Risk (05/07/2022)   Humiliation, Afraid,  Rape, and Kick questionnaire    Fear of Current or Ex-Partner: No    Emotionally Abused: No    Physically Abused: No    Sexually Abused: No    Family History  Problem Relation Age of Onset   Breast cancer Mother 58   Diabetes Mother    Hypertension Mother    CAD Father    Breast cancer Sister    Hypertension Sister    Breast cancer Maternal Aunt 87   Throat cancer Brother      Current Outpatient Medications:    Accu-Chek Softclix Lancets lancets, TEST TWICE DAILY, Disp: 200 each, Rfl: 2   alendronate (FOSAMAX) 70 MG tablet, TAKE 1 TABLET BY MOUTH WEEKLY  WITH A FULL GLASS OF WATER ON AN EMPTY STOMACH, Disp: 12 tablet, Rfl: 3   aspirin 81 MG chewable tablet, Chew by mouth daily., Disp: , Rfl:    atorvastatin (LIPITOR) 10 MG tablet, TAKE 1 TABLET BY MOUTH DAILY, Disp: 100 tablet, Rfl: 0   blood glucose meter kit and supplies, Dispense based on patient and insurance preference. Use up to four times daily as needed. (FOR ICD-10  E11.9)., Disp: 1 each, Rfl: 0   Calcium Carb-Cholecalciferol (CALCIUM 500 + D) 500-125 MG-UNIT TABS, Take 2 tablets by mouth daily., Disp: , Rfl:    fluocinonide ointment (LIDEX) 3.81 %, Apply 1 application topically 2 (two) times daily., Disp: , Rfl:    glucose blood (ACCU-CHEK GUIDE) test strip, Use as instructed, Disp: 100 each, Rfl: 12   irbesartan (AVAPRO) 75 MG tablet, TAKE 1 TABLET BY MOUTH DAILY, Disp: 90 tablet, Rfl: 0  Physical exam:  Vitals:   05/23/22 1430  BP: 134/85  Pulse: (!) 146  Resp: 16  Temp: 98.7 F (37.1 C)  TempSrc: Tympanic  Weight: 129 lb 14.4 oz (58.9 kg)  Height: '5\' 7"'  (1.702 m)   Physical Exam Constitutional:      General: She is not in acute distress. Cardiovascular:     Rate and Rhythm: Normal rate and regular rhythm.     Heart sounds: Normal heart sounds.  Pulmonary:     Effort: Pulmonary effort is normal.     Breath sounds: Normal breath sounds.  Abdominal:     General: Bowel sounds are normal.     Palpations: Abdomen is soft.  Musculoskeletal:     Comments: Trace bilateral edema  Skin:    General: Skin is warm and dry.  Neurological:     Mental Status: She is alert and oriented to person, place, and time.    Breast exam was performed in seated and lying down position. Patient is status post left lumpectomy with a well-healed surgical scar. No evidence of any palpable masses. No evidence of axillary adenopathy. No evidence of any palpable masses or lumps in the right breast. No evidence of right axillary adenopathy      Latest Ref Rng & Units 04/24/2022   10:31 AM  CMP  Glucose 70 - 99 mg/dL 109   BUN 8 - 27 mg/dL 18   Creatinine 0.57 - 1.00 mg/dL 1.23   Sodium 134 - 144 mmol/L 138   Potassium 3.5 - 5.2 mmol/L 4.4   Chloride 96 - 106 mmol/L 100   CO2 20 - 29 mmol/L 24   Calcium 8.7 - 10.3 mg/dL 9.7   Total Protein 6.0 - 8.5 g/dL 7.5   Total Bilirubin 0.0 - 1.2 mg/dL 0.4   Alkaline Phos 44 - 121 IU/L 73   AST 0 -  40 IU/L 23   ALT  0 - 32 IU/L 16       Latest Ref Rng & Units 04/24/2022   10:31 AM  CBC  WBC 3.4 - 10.8 x10E3/uL 4.4   Hemoglobin 11.1 - 15.9 g/dL 12.5   Hematocrit 34.0 - 46.6 % 38.2   Platelets 150 - 450 x10E3/uL 287      Assessment and plan- Patient is a 76 y.o. female with left breast DCIS ER positive here for routine follow-up  Clinically patient is doing well with no concerning signs and symptoms of recurrence based on today's exam.  She is completed 5 years of endocrine therapy.  Given that she is now 5 years out of her DCIS she does not require any further follow-up with me.  She will get her mammogram in November 2023.  Osteoporosis: On weekly Fosamax and patient can follow-up with Dr. Army Melia for this  Sinus tachycardia her heart rate initially in the office was 146 but on repeat check was down to 102.  Explained to the patient that if she has any symptoms of chest pain shortness of breath palpitations she should go to the ER.  Patient verbalized understanding   Visit Diagnosis 1. Age-related osteoporosis without current pathological fracture   2. Ductal carcinoma in situ (DCIS) of left breast      Dr. Randa Evens, MD, MPH [facility] [phone] 05/25/2022 10:45 AM

## 2022-06-06 ENCOUNTER — Ambulatory Visit: Payer: Self-pay | Admitting: *Deleted

## 2022-06-06 ENCOUNTER — Encounter: Payer: Self-pay | Admitting: Family Medicine

## 2022-06-06 ENCOUNTER — Ambulatory Visit (INDEPENDENT_AMBULATORY_CARE_PROVIDER_SITE_OTHER): Payer: Medicare Other | Admitting: Family Medicine

## 2022-06-06 VITALS — BP 132/76 | HR 94 | Ht 67.0 in | Wt 131.0 lb

## 2022-06-06 DIAGNOSIS — N3091 Cystitis, unspecified with hematuria: Secondary | ICD-10-CM

## 2022-06-06 DIAGNOSIS — R319 Hematuria, unspecified: Secondary | ICD-10-CM | POA: Diagnosis not present

## 2022-06-06 DIAGNOSIS — K5904 Chronic idiopathic constipation: Secondary | ICD-10-CM

## 2022-06-06 LAB — POCT URINALYSIS DIPSTICK
Bilirubin, UA: NEGATIVE
Glucose, UA: NEGATIVE
Ketones, UA: NEGATIVE
Nitrite, UA: NEGATIVE
Protein, UA: POSITIVE — AB
Spec Grav, UA: <=1.005 — AB (ref 1.010–1.025)
Urobilinogen, UA: 0.2 E.U./dL
pH, UA: 6.5 (ref 5.0–8.0)

## 2022-06-06 MED ORDER — SULFAMETHOXAZOLE-TRIMETHOPRIM 800-160 MG PO TABS: 1.0000 | ORAL_TABLET | Freq: Two times a day (BID) | ORAL | 0 refills | Status: AC

## 2022-06-06 NOTE — Assessment & Plan Note (Signed)
Patient with longstanding history of the same, has noted recent severe worsening, admits to 2 small bottles of water daily.  Given her urine specific gravity, recent worsened constipation, I have encouraged significant ramp up and free water intake using urine color as a guide once blood-tinged resolves.  I have also advised start of MiraLAX daily with titrations every 3 days, initiation of Dulcolax for any lack of bowel movement in 3 days.  Additionally, hemorrhoids to be considered, she is to contact us for any persistent symptoms after antibiotic course.

## 2022-06-06 NOTE — Patient Instructions (Signed)
-   Take antibiotics for full 7-day course - Increase water intake to allow light-tinge of yellow urine color - Restart MiraLAX daily, increase after 3 days if needed - Can dose Dulcolax if not having a bowel movement in 3 days - If still having blood after antibiotic regimen complete, contact our office for next steps

## 2022-06-06 NOTE — Telephone Encounter (Signed)
  Chief Complaint: blood in urine Symptoms: urinating blood this am x 1 pea size clot noted , hands tingling during urination and stopped after urinating. Episode of urinating blood x 1 today . Patient very anxious regarding sx  Frequency: x 1 today  Pertinent Negatives: Patient denies fever, no abdominal pain or buring during urination Disposition: '[]'$ ED /'[]'$ Urgent Care (no appt availability in office) / '[]'$ Appointment(In office/virtual)/ '[]'$  [place]/ '[]'$ Home Care/ '[]'$ Refused Recommended Disposition /'[]'$ [place] [facility]/ '[x]'$  Follow-up with PCP Additional Notes:   Recommended to be see within 24 hours with PCP or go to UC. Transferred call to [facility] for assistance with scheduling appt.    Reason for Disposition  Blood in urine  (Exception: Could be normal menstrual bleeding.)  Answer Assessment - Initial Assessment Questions 1. COLOR of URINE: "Describe the color of the urine."  (e.g., tea-colored, pink, red, bloody) "Do you have blood clots in your urine?" (e.g., none, pea, grape, small coin)     Red bloody pea size clots  2. ONSET: "When did the bleeding start?"      This am  3. EPISODES: "How many times has there been blood in the urine?" or "How many times today?"     X 1  4. PAIN with URINATION: "Is there any pain with passing your urine?" If Yes, ask: "How bad is the pain?"  (Scale 1-10; or mild, moderate, severe)    - MILD: Complains slightly about urination hurting.    - MODERATE: Interferes with normal activities.      - SEVERE: Excruciating, unwilling or unable to urinate because of the pain.      No pain 5. FEVER: "Do you have a fever?" If Yes, ask: "What is your temperature, how was it measured, and when did it start?"     no 6. ASSOCIATED SYMPTOMS: "Are you passing urine more frequently than usual?"     No  7. OTHER SYMPTOMS: "Do you have any other symptoms?" (e.g., back/flank pain, abdomen pain, vomiting)     Hands tingling during urination and stopped after  urination 8. PREGNANCY: "Is there any chance you are pregnant?" "When was your last menstrual period?"     na  Protocols used: Urine - Blood In-A-AH

## 2022-06-06 NOTE — Assessment & Plan Note (Signed)
Blood in urine x 1 day, was dealing with severe constipation, acute on chronic yesterday.  Admits to limited free water intake, denies fevers, chills, has noted increased bloating/gas.  Urine study findings are consistent with infection, will send for culture, initiate Bactrim x7 days.  Can follow-up as needed.

## 2022-06-06 NOTE — Progress Notes (Signed)
     Primary Care / Sports Medicine Office Visit  Patient Information:  Patient ID: [name], female DOB: [birth year] Age: 76 y.o. MRN: [id number]   Brandy Diaz is a pleasant 76 y.o. female presenting with the following:  Chief Complaint  Patient presents with   Hematuria    Woke up this morning and had blood in her urine, no pain. Was really constipated. Only sees blood when she wipes. Strained really hard last night while using the bathroom.     Vitals:   06/06/22 1323  BP: 132/76  Pulse: 94  SpO2: 98%   Vitals:   06/06/22 1323  Weight: 131 lb (59.4 kg)  Height: '5\' 7"'$  (1.702 m)   Body mass index is 20.52 kg/m.  No results found.   Independent interpretation of notes and tests performed by another provider:   None  Procedures performed:   None  Pertinent History, Exam, Impression, and Recommendations:   Problem List Items Addressed This Visit       Digestive   Chronic idiopathic constipation (Chronic)    Patient with longstanding history of the same, has noted recent severe worsening, admits to 2 small bottles of water daily.  Given her urine specific gravity, recent worsened constipation, I have encouraged significant ramp up and free water intake using urine color as a guide once blood-tinged resolves.  I have also advised start of MiraLAX daily with titrations every 3 days, initiation of Dulcolax for any lack of bowel movement in 3 days.  Additionally, hemorrhoids to be considered, she is to contact us for any persistent symptoms after antibiotic course.        Genitourinary   Hemorrhagic cystitis - Primary    Blood in urine x 1 day, was dealing with severe constipation, acute on chronic yesterday.  Admits to limited free water intake, denies fevers, chills, has noted increased bloating/gas.  Urine study findings are consistent with infection, will send for culture, initiate Bactrim x7 days.  Can follow-up as needed.      Relevant Medications    sulfamethoxazole-trimethoprim (BACTRIM DS) 800-160 MG tablet   Other Relevant Orders   POCT urinalysis dipstick (Completed)   Urine Culture     Orders & Medications Meds ordered this encounter  Medications   sulfamethoxazole-trimethoprim (BACTRIM DS) 800-160 MG tablet    Sig: Take 1 tablet by mouth 2 (two) times daily for 7 days.    Dispense:  14 tablet    Refill:  0   Orders Placed This Encounter  Procedures   Urine Culture   POCT urinalysis dipstick     Return if symptoms worsen or fail to improve.     Montel Culver, MD   Primary Care Sports Medicine [place]

## 2022-06-10 LAB — URINE CULTURE

## 2022-06-10 LAB — SPECIMEN STATUS REPORT

## 2022-06-20 ENCOUNTER — Other Ambulatory Visit: Payer: Self-pay | Admitting: Internal Medicine

## 2022-06-20 DIAGNOSIS — R7303 Prediabetes: Secondary | ICD-10-CM

## 2022-06-23 NOTE — Telephone Encounter (Signed)
Requested Prescriptions  Pending Prescriptions Disp Refills  . glucose blood (ACCU-CHEK GUIDE) test strip [Pharmacy Med Name: Accu-Chek Guide In Vitro Strip] 200 strip 2    Sig: TEST TWICE DAILY     Endocrinology: Diabetes - Testing Supplies Passed - 06/20/2022 10:57 PM      Passed - Valid encounter within last 12 months    Recent Outpatient Visits          2 weeks ago Hemorrhagic cystitis   [facility] Montel Culver, MD   2 months ago Annual physical exam   [facility] Glean Hess, MD   8 months ago Essential (primary) hypertension   [facility] Glean Hess, MD   1 year ago Annual physical exam   [facility] Glean Hess, MD   1 year ago Prediabetes   [facility] Glean Hess, MD

## 2022-07-21 ENCOUNTER — Other Ambulatory Visit: Payer: Self-pay | Admitting: Internal Medicine

## 2022-07-21 DIAGNOSIS — I10 Essential (primary) hypertension: Secondary | ICD-10-CM

## 2022-07-23 NOTE — Telephone Encounter (Signed)
Requested Prescriptions  Pending Prescriptions Disp Refills  . irbesartan (AVAPRO) 75 MG tablet [Pharmacy Med Name: IRBESARTAN  '75MG'$   TAB] 90 tablet 1    Sig: TAKE 1 TABLET BY MOUTH DAILY     Cardiovascular:  Angiotensin Receptor Blockers Failed - 07/21/2022 11:24 PM      Failed - Cr in normal range and within 180 days    Creatinine  Date Value Ref Range Status  02/22/2012 1.11 0.60 - 1.30 mg/dL Final   Creatinine, Ser  Date Value Ref Range Status  04/24/2022 1.23 (H) 0.57 - 1.00 mg/dL Final         Passed - K in normal range and within 180 days    Potassium  Date Value Ref Range Status  04/24/2022 4.4 3.5 - 5.2 mmol/L Final  02/22/2012 4.0 3.5 - 5.1 mmol/L Final         Passed - Patient is not pregnant      Passed - Last BP in normal range    BP Readings from Last 1 Encounters:  06/06/22 132/76         Passed - Valid encounter within last 6 months    Recent Outpatient Visits          1 month ago Hemorrhagic cystitis   [place] [facility] [place], Earley Abide, MD   3 months ago Annual physical exam   [place] [facility], Jesse Sans, MD   9 months ago Essential (primary) hypertension   [place] [facility] at [facility] Jesse Sans, MD   1 year ago Annual physical exam   [facility] at [facility], Jesse Sans, MD   1 year ago [place] [facility] at [facility], Jesse Sans, MD

## 2022-08-23 ENCOUNTER — Other Ambulatory Visit: Payer: Self-pay | Admitting: Internal Medicine

## 2022-08-23 DIAGNOSIS — E782 Mixed hyperlipidemia: Secondary | ICD-10-CM

## 2022-08-25 NOTE — Telephone Encounter (Signed)
Requested Prescriptions  Pending Prescriptions Disp Refills  . atorvastatin (LIPITOR) 10 MG tablet [Pharmacy Med Name: Atorvastatin Calcium 10 MG Oral Tablet] 100 tablet 2    Sig: TAKE 1 TABLET BY MOUTH DAILY     Cardiovascular:  Antilipid - Statins Failed - 08/23/2022 10:01 PM      Failed - Lipid Panel in normal range within the last 12 months    Cholesterol, Total  Date Value Ref Range Status  04/24/2022 172 100 - 199 mg/dL Final   Cholesterol  Date Value Ref Range Status  02/14/2012 218 (H) 0 - 200 mg/dL Final   Ldl Cholesterol, Calc  Date Value Ref Range Status  02/14/2012 118 (H) 0 - 100 mg/dL Final   LDL Chol Calc (NIH)  Date Value Ref Range Status  04/24/2022 78 0 - 99 mg/dL Final   HDL Cholesterol  Date Value Ref Range Status  02/14/2012 87 (H) 40 - 60 mg/dL Final   HDL  Date Value Ref Range Status  04/24/2022 83 >39 mg/dL Final   Triglycerides  Date Value Ref Range Status  04/24/2022 55 0 - 149 mg/dL Final  02/14/2012 64 0 - 200 mg/dL Final         Passed - Patient is not pregnant      Passed - Valid encounter within last 12 months    Recent Outpatient Visits          2 months ago Hemorrhagic cystitis   [place] [facility] [place], Earley Abide, MD   4 months ago Annual physical exam   [place] [facility] at [facility], Jesse Sans, MD   10 months ago Essential (primary) hypertension   [place] [facility] at [facility], Jesse Sans, MD   1 year ago Annual physical exam   [facility] at [facility], Jesse Sans, MD   2 years ago Prediabetes   [facility] at [facility], Jesse Sans, MD

## 2022-10-01 ENCOUNTER — Ambulatory Visit
Admission: RE | Admit: 2022-10-01 | Discharge: 2022-10-01 | Disposition: A | Discharge status: Home / Self Care | Payer: Medicare Other | Source: Ambulatory Visit | Attending: Internal Medicine | Admitting: Internal Medicine

## 2022-10-01 DIAGNOSIS — Z1231 Encounter for screening mammogram for malignant neoplasm of breast: Secondary | ICD-10-CM | POA: Insufficient documentation

## 2022-10-15 ENCOUNTER — Encounter: Payer: Self-pay | Admitting: Internal Medicine

## 2022-10-15 ENCOUNTER — Ambulatory Visit (INDEPENDENT_AMBULATORY_CARE_PROVIDER_SITE_OTHER): Payer: Medicare Other | Admitting: Internal Medicine

## 2022-10-15 VITALS — BP 104/74 | HR 92 | Ht 67.0 in | Wt 129.0 lb

## 2022-10-15 DIAGNOSIS — R000 Tachycardia, unspecified: Secondary | ICD-10-CM | POA: Diagnosis not present

## 2022-10-15 DIAGNOSIS — I10 Essential (primary) hypertension: Secondary | ICD-10-CM

## 2022-10-15 NOTE — Progress Notes (Signed)
Date:  10/15/2022   Name:  Brandy Diaz   DOB:  October 14, 1946   MRN:  [id number]   Chief Complaint: Elevated Pulse  HPI She has had 2 episodes of elevated HR at Oncology - January and July.  She generally feels well but has noticed that after exertion such a yard work, she sometimes has a HR on her watch up to 150 and she feels fluttery and weak.  No syncope or chest pains.  The last episode was last week. They are self limited.  She has not gone to the ER or UC. She is not consuming more caffeine.  Medications are unchanged.  Lab Results  Component Value Date   NA 138 04/24/2022   K 4.4 04/24/2022   CO2 24 04/24/2022   GLUCOSE 109 (H) 04/24/2022   BUN 18 04/24/2022   CREATININE 1.23 (H) 04/24/2022   CALCIUM 9.7 04/24/2022   EGFR 45 (L) 04/24/2022   GFRNONAA 46 (L) 08/17/2020   Lab Results  Component Value Date   CHOL 172 04/24/2022   HDL 83 04/24/2022   LDLCALC 78 04/24/2022   TRIG 55 04/24/2022   CHOLHDL 2.1 04/24/2022   Lab Results  Component Value Date   TSH 1.380 04/24/2022   Lab Results  Component Value Date   HGBA1C 6.1 (H) 04/24/2022   Lab Results  Component Value Date   WBC 4.4 04/24/2022   HGB 12.5 04/24/2022   HCT 38.2 04/24/2022   MCV 91 04/24/2022   PLT 287 04/24/2022   Lab Results  Component Value Date   ALT 16 04/24/2022   AST 23 04/24/2022   ALKPHOS 73 04/24/2022   BILITOT 0.4 04/24/2022   Lab Results  Component Value Date   VD25OH 40.97 05/23/2022     Review of Systems  Constitutional:  Negative for chills, fatigue and fever.  Respiratory:  Negative for cough, chest tightness, shortness of breath and wheezing.   Cardiovascular:  Positive for palpitations. Negative for chest pain and leg swelling.  Neurological:  Negative for dizziness, light-headedness and headaches.  Psychiatric/Behavioral:  Negative for dysphoric mood and sleep disturbance. The patient is not nervous/anxious.     Patient Active Problem List   Diagnosis Date  Noted   Hemorrhagic cystitis 06/06/2022   Osteoporosis 04/24/2022   Chronic idiopathic constipation 10/22/2021   Venous stasis dermatitis of both lower extremities 11/08/2019   Ductal carcinoma in situ (DCIS) of left breast 05/21/2017   Localized edema 04/28/2017   Stage 3a chronic kidney disease ([place]) 01/01/2016   Essential (primary) hypertension 07/24/2015   Gastro-esophageal reflux disease without esophagitis 07/24/2015   Mixed hyperlipidemia 07/24/2015   Prediabetes 07/24/2015   Fibrocystic breast changes 07/24/2015   Anxiety 05/24/2012    Allergies  Allergen Reactions   Ace Inhibitors Other (See Comments)    Cough   Hydrochlorothiazide Other (See Comments)    Dizziness   Triamterene-Hctz Other (See Comments)    Appetite loss    Past Surgical History:  Procedure Laterality Date   ABDOMINAL HYSTERECTOMY  1980   BREAST BIOPSY Left 2018   DCIS   BREAST LUMPECTOMY Left 03/2017   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 WITH    BREAST LUMPECTOMY WITH NEEDLE LOCALIZATION Left 04/07/2017   Procedure: BREAST LUMPECTOMY WITH NEEDLE LOCALIZATION;  Surgeon: Vickie Epley, MD;  Location: [facility];  Service: General;  Laterality: Left;   BREAST SURGERY Left    Breast Biopsy   CATARACT EXTRACTION W/PHACO Right  06/12/2016   Procedure: CATARACT EXTRACTION PHACO AND INTRAOCULAR LENS PLACEMENT (IOC);  Surgeon: Eulogio Bear, MD;  Location: [facility];  Service: Ophthalmology;  Laterality: Right;  [place] 6.1 AP% 12.7 CDE .77 FLUID PACK LOT # [id number]   CATARACT EXTRACTION W/PHACO Left 04/02/2021   Procedure: CATARACT EXTRACTION PHACO AND INTRAOCULAR LENS PLACEMENT ([place]) LEFT MILOOP DIABETIC;  Surgeon: Birder Robson, MD;  Location: [place];  Service: Ophthalmology;  Laterality: Left;  Diabetic - diet controlled 52.81 03:47.0   COLONOSCOPY  08/02/2012   Dr. Donnella Sham - diverticuli otherwise normal   ESOPHAGOGASTRODUODENOSCOPY  08/02/2012   chronic  gastritis   EYE SURGERY Right    Cataract Extraction with IOL   OOPHORECTOMY      Social History   Tobacco Use   Smoking status: Never   Smokeless tobacco: Never   Tobacco comments:    smoking cessation materials not required  Vaping Use   Vaping Use: Never used  Substance Use Topics   Alcohol use: No    Alcohol/week: 0.0 standard drinks of alcohol   Drug use: No     Medication list has been reviewed and updated.  Current Meds  Medication Sig   Accu-Chek Softclix Lancets lancets TEST TWICE DAILY   alendronate (FOSAMAX) 70 MG tablet TAKE 1 TABLET BY MOUTH WEEKLY  WITH A FULL GLASS OF WATER ON AN EMPTY STOMACH   aspirin 81 MG chewable tablet Chew by mouth daily.   atorvastatin (LIPITOR) 10 MG tablet TAKE 1 TABLET BY MOUTH DAILY   blood glucose meter kit and supplies Dispense based on patient and insurance preference. Use up to four times daily as needed. (FOR ICD-10 E11.9).   Calcium Carb-Cholecalciferol (CALCIUM 500 + D) 500-125 MG-UNIT TABS Take 2 tablets by mouth daily.   fluocinonide ointment (LIDEX) 1.77 % Apply 1 application topically 2 (two) times daily.   glucose blood (ACCU-CHEK GUIDE) test strip TEST TWICE DAILY   irbesartan (AVAPRO) 75 MG tablet TAKE 1 TABLET BY MOUTH DAILY       10/15/2022    2:17 PM 04/24/2022    9:29 AM 10/22/2021    9:18 AM 04/22/2021    9:15 AM  GAD 7 : Generalized Anxiety Score  Nervous, Anxious, on Edge 0 0 0 0  Control/stop worrying 0 0 0 0  Worry too much - different things 0 0 0 0  Trouble relaxing 0 0 0 0  Restless 0 0 0 0  Easily annoyed or irritable 0 0 0 0  Afraid - awful might happen 0 0 0 0  Total GAD 7 Score 0 0 0 0  Anxiety Difficulty Not difficult at all Not difficult at all Not difficult at all        10/15/2022    2:16 PM 05/07/2022   10:20 AM 04/24/2022    9:29 AM  Depression screen PHQ 2/9  Decreased Interest 0 1 1  Down, Depressed, Hopeless 0 1 0  PHQ - 2 Score 0 2 1  Altered sleeping _0 Tired, decreased  energy _1 Change in appetite 0 0 0  Feeling bad or failure about yourself  0 0 0  Trouble concentrating 0 0 0  Moving slowly or fidgety/restless 0 0 0  Suicidal thoughts 0 0 0  PHQ-9 Score _2 Difficult doing work/chores Not difficult at all Not difficult at all Not difficult at all    BP Readings from Last 3 Encounters:  10/15/22 104/74  06/06/22 132/76  05/23/22 134/85    Physical Exam Vitals and nursing note reviewed.  Constitutional:      General: She is not in acute distress.    Appearance: Normal appearance. She is well-developed.  HENT:     Head: Normocephalic and atraumatic.  Cardiovascular:     Rate and Rhythm: Normal rate and regular rhythm.     Heart sounds: No murmur heard. Pulmonary:     Effort: Pulmonary effort is normal. No respiratory distress.     Breath sounds: No wheezing or rhonchi.  Musculoskeletal:     Cervical back: Normal range of motion.     Right lower leg: No edema.     Left lower leg: No edema.  Lymphadenopathy:     Cervical: No cervical adenopathy.  Skin:    General: Skin is warm and dry.     Findings: No rash.  Neurological:     Mental Status: She is alert and oriented to person, place, and time.  Psychiatric:        Mood and Affect: Mood normal.        Behavior: Behavior normal.     Wt Readings from Last 3 Encounters:  10/15/22 129 lb (58.5 kg)  06/06/22 131 lb (59.4 kg)  05/23/22 129 lb 14.4 oz (58.9 kg)    BP 104/74   Pulse 92   Ht _0  (1.702 m)   Wt 129 lb (58.5 kg)   SpO2 97%   BMI 20.20 kg/m   Assessment and Plan: 1. Tachycardia Episodic with mild symptoms  Will obtain screening labs Refer to cardiology for long term vs short term monitor. Encouraged to monitor for worsening and go to ED if needed - EKG 12-Lead - SR @ 88, borderline LAE - CBC with Differential/Platelet - Comprehensive metabolic panel - TSH - Ambulatory referral to Cardiology  2. Essential (primary) hypertension Clinically stable exam  with well controlled BP. Tolerating medications without side effects at this time. Pt to continue current regimen and low sodium diet; benefits of regular exercise as able discussed.   Partially dictated using Editor, commissioning. Any errors are unintentional.  Halina Maidens, MD [facility]  10/15/2022

## 2022-10-16 LAB — COMPREHENSIVE METABOLIC PANEL
ALT: 26 IU/L (ref 0–32)
AST: 29 IU/L (ref 0–40)
Albumin/Globulin Ratio: 1.2 (ref 1.2–2.2)
Albumin: 4.2 g/dL (ref 3.8–4.8)
Alkaline Phosphatase: 84 IU/L (ref 44–121)
BUN/Creatinine Ratio: 12 (ref 12–28)
BUN: 16 mg/dL (ref 8–27)
Bilirubin Total: 0.4 mg/dL (ref 0.0–1.2)
CO2: 25 mmol/L (ref 20–29)
Calcium: 10.3 mg/dL (ref 8.7–10.3)
Chloride: 98 mmol/L (ref 96–106)
Creatinine, Ser: 1.37 mg/dL — ABNORMAL HIGH (ref 0.57–1.00)
Globulin, Total: 3.6 g/dL (ref 1.5–4.5)
Glucose: 97 mg/dL (ref 70–99)
Potassium: 4.5 mmol/L (ref 3.5–5.2)
Sodium: 137 mmol/L (ref 134–144)
Total Protein: 7.8 g/dL (ref 6.0–8.5)
eGFR: 40 mL/min/{1.73_m2} — ABNORMAL LOW (ref >59)

## 2022-10-16 LAB — CBC WITH DIFFERENTIAL/PLATELET
Basophils Absolute: 0.1 10*3/uL (ref 0.0–0.2)
Basos: 2 %
EOS (ABSOLUTE): 0.3 10*3/uL (ref 0.0–0.4)
Eos: 6 %
Hematocrit: 38.4 % (ref 34.0–46.6)
Hemoglobin: 12.6 g/dL (ref 11.1–15.9)
Immature Grans (Abs): 0 10*3/uL (ref 0.0–0.1)
Immature Granulocytes: 0 %
Lymphocytes Absolute: 1.2 10*3/uL (ref 0.7–3.1)
Lymphs: 24 %
MCH: 29 pg (ref 26.6–33.0)
MCHC: 32.8 g/dL (ref 31.5–35.7)
MCV: 89 fL (ref 79–97)
Monocytes Absolute: 0.5 10*3/uL (ref 0.1–0.9)
Monocytes: 11 %
Neutrophils Absolute: 2.7 10*3/uL (ref 1.4–7.0)
Neutrophils: 57 %
Platelets: 317 10*3/uL (ref 150–450)
RBC: 4.34 x10E6/uL (ref 3.77–5.28)
RDW: 12.7 % (ref 11.7–15.4)
WBC: 4.7 10*3/uL (ref 3.4–10.8)

## 2022-10-16 LAB — TSH: TSH: 1.15 u[IU]/mL (ref 0.450–4.500)

## 2022-10-22 ENCOUNTER — Other Ambulatory Visit: Payer: Self-pay | Admitting: Internal Medicine

## 2022-10-22 DIAGNOSIS — I10 Essential (primary) hypertension: Secondary | ICD-10-CM

## 2022-10-22 NOTE — Telephone Encounter (Signed)
Requested Prescriptions  Pending Prescriptions Disp Refills   irbesartan (AVAPRO) 75 MG tablet [Pharmacy Med Name: IRBESARTAN  '75MG'$   TAB] 100 tablet 0    Sig: TAKE 1 TABLET BY MOUTH DAILY     Cardiovascular:  Angiotensin Receptor Blockers Failed - 10/22/2022  7:40 AM      Failed - Cr in normal range and within 180 days    Creatinine  Date Value Ref Range Status  02/22/2012 1.11 0.60 - 1.30 mg/dL Final   Creatinine, Ser  Date Value Ref Range Status  10/15/2022 1.37 (H) 0.57 - 1.00 mg/dL Final         Passed - K in normal range and within 180 days    Potassium  Date Value Ref Range Status  10/15/2022 4.5 3.5 - 5.2 mmol/L Final  02/22/2012 4.0 3.5 - 5.1 mmol/L Final         Passed - Patient is not pregnant      Passed - Last BP in normal range    BP Readings from Last 1 Encounters:  10/15/22 104/74         Passed - Valid encounter within last 6 months    Recent Outpatient Visits           1 week ago Tachycardia   [place] [facility] at [facility], Jesse Sans, MD   4 months ago Hemorrhagic cystitis   [place] [facility] at [place], Earley Abide, MD   6 months ago Annual physical exam   [facility] at [facility], Jesse Sans, MD   1 year ago Essential (primary) hypertension   [place] [facility] at [facility], Jesse Sans, MD   1 year ago Annual physical exam   [facility] Care [facility] Medicine at [facility], Jesse Sans, MD       Future Appointments             In 1 month Agbor-Etang, Aaron Edelman, MD [place]. [place]

## 2022-11-17 ENCOUNTER — Encounter: Payer: Self-pay | Admitting: Internal Medicine

## 2022-11-17 ENCOUNTER — Ambulatory Visit (INDEPENDENT_AMBULATORY_CARE_PROVIDER_SITE_OTHER): Payer: 59 | Admitting: Internal Medicine

## 2022-11-17 VITALS — BP 138/80 | HR 77 | Ht 67.0 in | Wt 129.0 lb

## 2022-11-17 DIAGNOSIS — N1831 Chronic kidney disease, stage 3a: Secondary | ICD-10-CM

## 2022-11-17 DIAGNOSIS — I10 Essential (primary) hypertension: Secondary | ICD-10-CM | POA: Diagnosis not present

## 2022-11-17 NOTE — Progress Notes (Signed)
Date:  11/17/2022   Name:  TANIYA DASHER   DOB:  April 24, 1946   MRN:  [id number]   Chief Complaint: Hypertension  Hypertension This is a chronic problem. The problem is unchanged. The problem is controlled. Pertinent negatives include no chest pain, headaches or shortness of breath. (Was having tachycardia but that has improved.) There are no associated agents to hypertension. Past treatments include angiotensin blockers. The current treatment provides significant improvement. Hypertensive end-organ damage includes kidney disease. There is no history of CAD/MI or CVA.    Lab Results  Component Value Date   NA 137 10/15/2022   K 4.5 10/15/2022   CO2 25 10/15/2022   GLUCOSE 97 10/15/2022   BUN 16 10/15/2022   CREATININE 1.37 (H) 10/15/2022   CALCIUM 10.3 10/15/2022   EGFR 40 (L) 10/15/2022   GFRNONAA 46 (L) 08/17/2020   Lab Results  Component Value Date   CHOL 172 04/24/2022   HDL 83 04/24/2022   LDLCALC 78 04/24/2022   TRIG 55 04/24/2022   CHOLHDL 2.1 04/24/2022   Lab Results  Component Value Date   TSH 1.150 10/15/2022   Lab Results  Component Value Date   HGBA1C 6.1 (H) 04/24/2022   Lab Results  Component Value Date   WBC 4.7 10/15/2022   HGB 12.6 10/15/2022   HCT 38.4 10/15/2022   MCV 89 10/15/2022   PLT 317 10/15/2022   Lab Results  Component Value Date   ALT 26 10/15/2022   AST 29 10/15/2022   ALKPHOS 84 10/15/2022   BILITOT 0.4 10/15/2022   Lab Results  Component Value Date   VD25OH 40.97 05/23/2022     Review of Systems  Constitutional:  Negative for chills, fatigue and fever.  Respiratory:  Negative for chest tightness and shortness of breath.   Cardiovascular:  Negative for chest pain and leg swelling.  Neurological:  Negative for dizziness, light-headedness and headaches.    Patient Active Problem List   Diagnosis Date Noted   Tachycardia 10/15/2022   Hemorrhagic cystitis 06/06/2022   Osteoporosis 04/24/2022   Chronic idiopathic  constipation 10/22/2021   Venous stasis dermatitis of both lower extremities 11/08/2019   Ductal carcinoma in situ (DCIS) of left breast 05/21/2017   Localized edema 04/28/2017   Stage 3a chronic kidney disease ([place]) 01/01/2016   Essential (primary) hypertension 07/24/2015   Gastro-esophageal reflux disease without esophagitis 07/24/2015   Mixed hyperlipidemia 07/24/2015   Prediabetes 07/24/2015   Fibrocystic breast changes 07/24/2015   Anxiety 05/24/2012    Allergies  Allergen Reactions   Ace Inhibitors Other (See Comments)    Cough   Hydrochlorothiazide Other (See Comments)    Dizziness   Triamterene-Hctz Other (See Comments)    Appetite loss    Past Surgical History:  Procedure Laterality Date   ABDOMINAL HYSTERECTOMY  1980   BREAST BIOPSY Left 2018   DCIS   BREAST LUMPECTOMY Left 03/2017   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 WITH    BREAST LUMPECTOMY WITH NEEDLE LOCALIZATION Left 04/07/2017   Procedure: BREAST LUMPECTOMY WITH NEEDLE LOCALIZATION;  Surgeon: Vickie Epley, MD;  Location: [facility];  Service: General;  Laterality: Left;   BREAST SURGERY Left    Breast Biopsy   CATARACT EXTRACTION W/PHACO Right 06/12/2016   Procedure: CATARACT EXTRACTION PHACO AND INTRAOCULAR LENS PLACEMENT ([place]);  Surgeon: Eulogio Bear, MD;  Location: [facility];  Service: Ophthalmology;  Laterality: Right;  [place] 6.1 AP% 12.7 CDE .77 FLUID PACK  LOT # [id number]   CATARACT EXTRACTION W/PHACO Left 04/02/2021   Procedure: CATARACT EXTRACTION PHACO AND INTRAOCULAR LENS PLACEMENT ([place]) LEFT MILOOP DIABETIC;  Surgeon: Birder Robson, MD;  Location: [place];  Service: Ophthalmology;  Laterality: Left;  Diabetic - diet controlled 52.81 03:47.0   COLONOSCOPY  08/02/2012   Dr. Donnella Sham - diverticuli otherwise normal   ESOPHAGOGASTRODUODENOSCOPY  08/02/2012   chronic gastritis   EYE SURGERY Right    Cataract Extraction with IOL   OOPHORECTOMY       Social History   Tobacco Use   Smoking status: Never   Smokeless tobacco: Never   Tobacco comments:    smoking cessation materials not required  Vaping Use   Vaping Use: Never used  Substance Use Topics   Alcohol use: No    Alcohol/week: 0.0 standard drinks of alcohol   Drug use: No     Medication list has been reviewed and updated.  Current Meds  Medication Sig   Accu-Chek Softclix Lancets lancets TEST TWICE DAILY   alendronate (FOSAMAX) 70 MG tablet TAKE 1 TABLET BY MOUTH WEEKLY  WITH A FULL GLASS OF WATER ON AN EMPTY STOMACH   aspirin 81 MG chewable tablet Chew by mouth daily.   atorvastatin (LIPITOR) 10 MG tablet TAKE 1 TABLET BY MOUTH DAILY   blood glucose meter kit and supplies Dispense based on patient and insurance preference. Use up to four times daily as needed. (FOR ICD-10 E11.9).   Calcium Carb-Cholecalciferol (CALCIUM 500 + D) 500-125 MG-UNIT TABS Take 2 tablets by mouth daily.   fluocinonide ointment (LIDEX) 9.37 % Apply 1 application topically 2 (two) times daily.   glucose blood (ACCU-CHEK GUIDE) test strip TEST TWICE DAILY   irbesartan (AVAPRO) 75 MG tablet TAKE 1 TABLET BY MOUTH DAILY       10/15/2022    2:17 PM 04/24/2022    9:29 AM 10/22/2021    9:18 AM 04/22/2021    9:15 AM  GAD 7 : Generalized Anxiety Score  Nervous, Anxious, on Edge 0 0 0 0  Control/stop worrying 0 0 0 0  Worry too much - different things 0 0 0 0  Trouble relaxing 0 0 0 0  Restless 0 0 0 0  Easily annoyed or irritable 0 0 0 0  Afraid - awful might happen 0 0 0 0  Total GAD 7 Score 0 0 0 0  Anxiety Difficulty Not difficult at all Not difficult at all Not difficult at all        10/15/2022    2:16 PM 05/07/2022   10:20 AM 04/24/2022    9:29 AM  Depression screen PHQ 2/9  Decreased Interest 0 1 1  Down, Depressed, Hopeless 0 1 0  PHQ - 2 Score 0 2 1  Altered sleeping '1 1 1  '$ Tired, decreased energy '1 1 1  '$ Change in appetite 0 0 0  Feeling bad or failure about yourself  0  0 0  Trouble concentrating 0 0 0  Moving slowly or fidgety/restless 0 0 0  Suicidal thoughts 0 0 0  PHQ-9 Score '2 4 3  '$ Difficult doing work/chores Not difficult at all Not difficult at all Not difficult at all    BP Readings from Last 3 Encounters:  11/17/22 138/80  10/15/22 104/74  06/06/22 132/76    Physical Exam Vitals and nursing note reviewed.  Constitutional:      General: She is not in acute distress.    Appearance: Normal appearance. She is  well-developed.  HENT:     Head: Normocephalic and atraumatic.  Cardiovascular:     Rate and Rhythm: Normal rate and regular rhythm.  Pulmonary:     Effort: Pulmonary effort is normal. No respiratory distress.     Breath sounds: No wheezing or rhonchi.  Musculoskeletal:     Cervical back: Normal range of motion.     Right lower leg: No edema.     Left lower leg: No edema.  Lymphadenopathy:     Cervical: No cervical adenopathy.  Skin:    General: Skin is warm and dry.     Findings: No rash.  Neurological:     Mental Status: She is alert and oriented to person, place, and time.  Psychiatric:        Mood and Affect: Mood normal.        Behavior: Behavior normal.     Wt Readings from Last 3 Encounters:  11/17/22 129 lb (58.5 kg)  10/15/22 129 lb (58.5 kg)  06/06/22 131 lb (59.4 kg)    BP 138/80   Pulse 77   Ht '5\' 7"'$  (1.702 m)   Wt 129 lb (58.5 kg)   SpO2 98%   BMI 20.20 kg/m   Assessment and Plan: Problem List Items Addressed This Visit       Cardiovascular and Mediastinum   Essential (primary) hypertension - Primary (Chronic)    Clinically stable exam with well controlled BP of Avapro. She is seeing Cardiology later this month for tachycardia Currently doing well. Pt to continue current regimen and low sodium diet; benefits of regular exercise as able discussed.         Genitourinary   Stage 3a chronic kidney disease (HCC) (Chronic)    Slight decrease in GFR to 40 last visit Continue fluids Repeat  labs next visit         Partially dictated using Dragon software. Any errors are unintentional.  Halina Maidens, MD [facility]  11/17/2022

## 2022-11-17 NOTE — Assessment & Plan Note (Addendum)
Clinically stable exam with well controlled BP of Avapro. She is seeing Cardiology later this month for tachycardia Currently doing well. Pt to continue current regimen and low sodium diet; benefits of regular exercise as able discussed.

## 2022-11-17 NOTE — Assessment & Plan Note (Signed)
Slight decrease in GFR to 40 last visit Continue fluids Repeat labs next visit

## 2022-11-26 ENCOUNTER — Other Ambulatory Visit: Payer: Self-pay | Admitting: Internal Medicine

## 2022-11-26 DIAGNOSIS — E1122 Type 2 diabetes mellitus with diabetic chronic kidney disease: Secondary | ICD-10-CM

## 2022-11-27 NOTE — Telephone Encounter (Signed)
Requested Prescriptions  Pending Prescriptions Disp Refills   Accu-Chek Softclix Lancets lancets [Pharmacy Med Name: Accu-Chek Softclix Lancets] 200 each 0    Sig: TEST TWICE DAILY     Endocrinology: Diabetes - Testing Supplies Passed - 11/26/2022 11:48 PM      Passed - Valid encounter within last 12 months    Recent Outpatient Visits           1 week ago Essential (primary) hypertension   [place] [facility] at [facility], Jesse Sans, MD   1 month ago Tachycardia   [place] [facility] at [facility], Jesse Sans, MD   5 months ago Hemorrhagic cystitis   [place] [facility] Medicine at [place], Earley Abide, MD   7 months ago Annual physical exam   [facility] Medicine at [facility], Jesse Sans, MD   1 year ago Essential (primary) hypertension   [place] [facility] at [facility], Jesse Sans, MD       Future Appointments             In 1 week Agbor-Etang, Aaron Edelman, MD [place]. [place]   In 2 months Army Melia, Jesse Sans, MD [place] [facility] at [facility]

## 2022-12-05 ENCOUNTER — Encounter: Payer: Self-pay | Admitting: Cardiology

## 2022-12-05 ENCOUNTER — Ambulatory Visit (INDEPENDENT_AMBULATORY_CARE_PROVIDER_SITE_OTHER): Payer: 59

## 2022-12-05 ENCOUNTER — Ambulatory Visit: Payer: 59 | Attending: Cardiology | Admitting: Cardiology

## 2022-12-05 VITALS — BP 172/90 | HR 103 | Ht 67.0 in | Wt 131.2 lb

## 2022-12-05 DIAGNOSIS — R0600 Dyspnea, unspecified: Secondary | ICD-10-CM | POA: Diagnosis not present

## 2022-12-05 DIAGNOSIS — I10 Essential (primary) hypertension: Secondary | ICD-10-CM

## 2022-12-05 DIAGNOSIS — R000 Tachycardia, unspecified: Secondary | ICD-10-CM

## 2022-12-05 DIAGNOSIS — E7800 Pure hypercholesterolemia, unspecified: Secondary | ICD-10-CM

## 2022-12-05 NOTE — Progress Notes (Signed)
Cardiology Office Note:    Date:  12/05/2022   ID:  Brandy Diaz, DOB [birth year], MRN [id number]  PCP:  Glean Hess, MD   [facility] Prov[facility] Cardiologist:  Kate Sable, MD     Referring MD: Glean Hess, MD   Chief Complaint  Patient presents with   New Patient (Initial Visit)    Tachycardia (once or twice per month),  no Hx    History of Present Illness:    Brandy Diaz is a 77 y.o. female with a hx of hypertension, hyperlipidemia, CKD 3 who presents due to tachycardia.  States having elevated heart rates at PCPs office.  Denies chest pain or palpitations.  Blood pressures are well-controlled at home with systolics usually around 211.  Blood pressure usually becomes elevated when she comes to the hospital at physicians office.  Denies any history of heart disease, and does have a shortness of breath with she rakes leaves around her house or when she cleans.  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 WITH    BREAST LUMPECTOMY WITH NEEDLE LOCALIZATION Left 04/07/2017   Procedure: BREAST LUMPECTOMY WITH NEEDLE LOCALIZATION;  Surgeon: Vickie Epley, MD;  Location: [facility];  Service: General;  Laterality: Left;   BREAST SURGERY Left    Breast Biopsy   CATARACT EXTRACTION W/PHACO Right 06/12/2016   Procedure: CATARACT EXTRACTION PHACO AND INTRAOCULAR LENS PLACEMENT ([place]);  Surgeon: Eulogio Bear, MD;  Location: [facility];  Service: Ophthalmology;  Laterality: Right;  [place] 6.1 AP% 12.7 CDE .77 FLUID PACK LOT # [id number]   CATARACT EXTRACTION W/PHACO Left 04/02/2021   Procedure: CATARACT EXTRACTION PHACO AND INTRAOCULAR LENS PLACEMENT ([place]) LEFT MILOOP DIABETIC;  Surgeon: Birder Robson, MD;  Location: [facility];  Service: Ophthalmology;  Laterality: Left;  Diabetic - diet controlled 52.81 03:47.0   COLONOSCOPY  08/02/2012   Dr. Donnella Sham - diverticuli otherwise normal   ESOPHAGOGASTRODUODENOSCOPY  08/02/2012   chronic gastritis   EYE SURGERY Right    Cataract Extraction with IOL   OOPHORECTOMY      Current Medications: Current Meds  Medication Sig   Accu-Chek Softclix Lancets lancets TEST TWICE DAILY   alendronate (FOSAMAX) 70 MG tablet TAKE 1 TABLET BY MOUTH WEEKLY  WITH A FULL GLASS OF WATER ON AN EMPTY STOMACH   aspirin 81 MG chewable tablet Chew by mouth daily.   atorvastatin (LIPITOR) 10 MG tablet TAKE 1 TABLET BY MOUTH DAILY   blood glucose meter kit and supplies Dispense based on patient and insurance preference. Use up to four times daily as needed. (FOR ICD-10 E11.9).   Calcium Carb-Cholecalciferol (CALCIUM 500 + D) 500-125 MG-UNIT TABS Take 2 tablets by mouth daily.   fluocinonide ointment (LIDEX) 4.48 % Apply 1 application topically 2 (two) times daily.   glucose blood (  ACCU-CHEK GUIDE) test strip TEST TWICE DAILY   irbesartan (AVAPRO) 75 MG tablet TAKE 1 TABLET BY MOUTH DAILY     Allergies:   Ace inhibitors, Hydrochlorothiazide, and Triamterene-hctz   Social History   Socioeconomic History   Marital status: Married    Spouse name: Not on file   Number of children: 0   Years of education: Not on file   Highest education level: 10th grade  Occupational History   Occupation: Retired  Tobacco Use   Smoking status: Never    Passive exposure: Past   Smokeless tobacco: Never   Tobacco comments:    smoking cessation materials not required   Vaping Use   Vaping Use: Never used  Substance and Sexual Activity   Alcohol use: No    Alcohol/week: 0.0 standard drinks of alcohol   Drug use: No   Sexual activity: Not Currently  Other Topics Concern   Not on file  Social History Narrative   Not on file   Social Determinants of Health   Financial Resource Strain: Low Risk  (11/17/2022)   Overall Financial Resource Strain (CARDIA)    Difficulty of Paying Living Expenses: Not hard at all  Food Insecurity: No Food Insecurity (11/17/2022)   Hunger Vital Sign    Worried About Running Out of Food in the Last Year: Never true    Ran Out of Food in the Last Year: Never true  Transportation Needs: No Transportation Needs (11/17/2022)   PRAPARE - Hydrologist (Medical): No    Lack of Transportation (Non-Medical): No  Physical Activity: Inactive (05/07/2022)   Exercise Vital Sign    Days of Exercise per Week: 0 days    Minutes of Exercise per Session: 0 min  Stress: No Stress Concern Present (05/07/2022)   [place]    Feeling of Stress : Not at all  Social Connections: Moderately Integrated (05/07/2022)   Social Connection and Isolation Panel [NHANES]    Frequency of Communication with Friends and Family: More than three times a week    Frequency of Social Gatherings with Friends and Family: Twice a week    Attends Religious Services: More than 4 times per year    Active Member of [employer] or Organizations: No    Attends Music therapist: Never    Marital Status: Married     Family History: The patient's family history includes Breast cancer in her sister; Breast cancer (age of onset: 61) in her maternal aunt; Breast cancer (age of onset: 74) in her mother; CAD in her father; Diabetes in her mother; Hypertension in her mother and sister; Throat cancer in her brother.  ROS:   Please see the history of present illness.     All other  systems reviewed and are negative.  EKGs/Labs/Other Studies Reviewed:    The following studies were reviewed today:   EKG:  EKG is  ordered today.  The ekg ordered today demonstrates sinus tachycardia, heart rate 103  Recent Labs: 10/15/2022: ALT 26; BUN 16; Creatinine, Ser 1.37; Hemoglobin 12.6; Platelets 317; Potassium 4.5; Sodium 137; TSH 1.150  Recent Lipid Panel    Component Value Date/Time   CHOL 172 04/24/2022 1031   CHOL 218 (H) 02/14/2012 1036   TRIG 55 04/24/2022 1031   TRIG 64 02/14/2012 1036   HDL 83 04/24/2022 1031   HDL 87 (H) 02/14/2012 1036   CHOLHDL 2.1 04/24/2022 1031   VLDL 13  02/14/2012 1036   LDLCALC 78 04/24/2022 1031   LDLCALC 118 (H) 02/14/2012 1036     Risk Assessment/Calculations:     HYPERTENSION CONTROL Vitals:   12/05/22 1320 12/05/22 1327  BP: (!) 174/94 (!) 172/90    The patient's blood pressure is elevated above target today.  In order to address the patient's elevated BP: The blood pressure is usually elevated in clinic.  Blood pressures monitored at home have been optimal.            Physical Exam:    VS:  BP (!) 172/90 (BP Location: Right Arm, Patient Position: Sitting, Cuff Size: Normal)   Pulse (!) 103   Ht '5\' 7"'$  (1.702 m)   Wt 131 lb 3.2 oz (59.5 kg)   SpO2 99%   BMI 20.55 kg/m     Wt Readings from Last 3 Encounters:  12/05/22 131 lb 3.2 oz (59.5 kg)  11/17/22 129 lb (58.5 kg)  10/15/22 129 lb (58.5 kg)     GEN:  Well nourished, well developed in no acute distress HEENT: Normal NECK: No JVD; No carotid bruits CARDIAC: Regular, tachycardic RESPIRATORY:  Clear to auscultation without rales, wheezing or rhonchi  ABDOMEN: Soft, non-tender, non-distended MUSCULOSKELETAL:  No edema; No deformity  SKIN: Warm and dry NEUROLOGIC:  Alert and oriented x 3 PSYCHIATRIC:  Normal affect   ASSESSMENT:    1. Tachycardia   2. Primary hypertension   3. Pure hypercholesterolemia   4. Dyspnea, unspecified type    PLAN:     In order of problems listed above:  Tachycardia, EKG showed sinus tach.  Place cardiac monitor to rule out any other underlying arrhythmias.  If cardiac monitor shows no significant arrhythmias, may consider addition of beta-blocker. Hypertension, BP elevated, controlled at home.  Likely has a component of whitecoat syndrome.  Continue Avapro as prescribed. Hyperlipidemia, continue Lipitor 10 mg daily. Dyspnea sometimes with exertion, obtain echo to rule out any structural abnormalities.  Does not appear to be angina equivalent.  Follow-up after echo and cardiac monitor      Medication Adjustments/Labs and Tests Ordered: Current medicines are reviewed at length with the patient today.  Concerns regarding medicines are outlined above.  Orders Placed This Encounter  Procedures   LONG TERM MONITOR (3-14 DAYS)   EKG 12-Lead   ECHOCARDIOGRAM COMPLETE   No orders of the defined types were placed in this encounter.   Patient Instructions  Medication Instructions:   Your physician recommends that you continue on your current medications as directed. Please refer to the Current Medication list given to you today.  *If you need a refill on your cardiac medications before your next appointment, please call your pharmacy*   Lab Work:  None Ordered  If you have labs (blood work) drawn today and your tests are completely normal, you will receive your results only by: [place] (if you have MyChart) OR A paper copy in the mail If you have any lab test that is abnormal or we need to change your treatment, we will call you to review the results.   Testing/Procedures:  Your physician has requested that you have an echocardiogram. Echocardiography is a painless test that uses sound waves to create images of your heart. It provides your doctor with information about the size and shape of your heart and how well your heart's chambers and valves are working. This procedure takes  approximately one hour. There are no restrictions for this procedure. Please do NOT wear cologne,  perfume, aftershave, or lotions (deodorant is allowed). Please arrive 15 minutes prior to your appointment time.  2. Your physician has recommended that you wear a Zio monitor.   This monitor is a medical device that records the heart's electrical activity. Doctors most often use these monitors to diagnose arrhythmias. Arrhythmias are problems with the speed or rhythm of the heartbeat. The monitor is a small device applied to your chest. You can wear one while you do your normal daily activities. While wearing this monitor if you have any symptoms to push the button and record what you felt. Once you have worn this monitor for the period of time provider prescribed (Usually 14 days), you will return the monitor device in the postage paid box. Once it is returned they will download the data collected and provide [place] with a report which the provider will then review and we will call you with those results. Important tips:  Avoid showering during the first 24 hours of wearing the monitor. Avoid excessive sweating to help maximize wear time. Do not submerge the device, no hot tubs, and no swimming pools. Keep any lotions or oils away from the patch. After 24 hours you may shower with the patch on. Take brief showers with your back facing the shower head.  Do not remove patch once it has been placed because that will interrupt data and decrease adhesive wear time. Push the button when you have any symptoms and write down what you were feeling. Once you have completed wearing your monitor, remove and place into box which has postage paid and place in your outgoing mailbox.  If for some reason you have misplaced your box then call our office and we can provide another box and/or mail it off for you.       Follow-Up: At [facility], you and your health needs are our priority.  As part of our  continuing mission to provide you with exceptional heart care, we have created designated Provider Care Teams.  These Care Teams include your primary Cardiologist (physician) and Advanced Practice Providers (APPs -  Physician Assistants and Nurse Practitioners) who all work together to provide you with the care you need, when you need it.  We recommend signing up for the patient portal called "MyChart".  Sign up information is provided on this After Visit Summary.  MyChart is used to connect with patients for Virtual Visits (Telemedicine).  Patients are able to view lab/test results, encounter notes, upcoming appointments, etc.  Non-urgent messages can be sent to your provider as well.   To learn more about what you can do with MyChart, go to [email or web address].    Your next appointment:    After TESTING  Provider:   You may see Kate Sable, MD or one of the following Advanced Practice Providers on your designated Care Team:   Murray Hodgkins, NP Christell Faith, PA-C Cadence Kathlen Mody, PA-C Gerrie Nordmann, NP    Signed, Kate Sable, MD  12/05/2022 2:33 PM    [place]

## 2022-12-05 NOTE — Patient Instructions (Signed)
Medication Instructions:   Your physician recommends that you continue on your current medications as directed. Please refer to the Current Medication list given to you today.  *If you need a refill on your cardiac medications before your next appointment, please call your pharmacy*   Lab Work:  None Ordered  If you have labs (blood work) drawn today and your tests are completely normal, you will receive your results only by: [place] (if you have MyChart) OR A paper copy in the mail If you have any lab test that is abnormal or we need to change your treatment, we will call you to review the results.   Testing/Procedures:  Your physician has requested that you have an echocardiogram. Echocardiography is a painless test that uses sound waves to create images of your heart. It provides your doctor with information about the size and shape of your heart and how well your heart's chambers and valves are working. This procedure takes approximately one hour. There are no restrictions for this procedure. Please do NOT wear cologne, perfume, aftershave, or lotions (deodorant is allowed). Please arrive 15 minutes prior to your appointment time.  2. Your physician has recommended that you wear a Zio monitor.   This monitor is a medical device that records the heart's electrical activity. Doctors most often use these monitors to diagnose arrhythmias. Arrhythmias are problems with the speed or rhythm of the heartbeat. The monitor is a small device applied to your chest. You can wear one while you do your normal daily activities. While wearing this monitor if you have any symptoms to push the button and record what you felt. Once you have worn this monitor for the period of time provider prescribed (Usually 14 days), you will return the monitor device in the postage paid box. Once it is returned they will download the data collected and provide [place] with a report which the provider will then review  and we will call you with those results. Important tips:  Avoid showering during the first 24 hours of wearing the monitor. Avoid excessive sweating to help maximize wear time. Do not submerge the device, no hot tubs, and no swimming pools. Keep any lotions or oils away from the patch. After 24 hours you may shower with the patch on. Take brief showers with your back facing the shower head.  Do not remove patch once it has been placed because that will interrupt data and decrease adhesive wear time. Push the button when you have any symptoms and write down what you were feeling. Once you have completed wearing your monitor, remove and place into box which has postage paid and place in your outgoing mailbox.  If for some reason you have misplaced your box then call our office and we can provide another box and/or mail it off for you.       Follow-Up: At [facility], you and your health needs are our priority.  As part of our continuing mission to provide you with exceptional heart care, we have created designated Provider Care Teams.  These Care Teams include your primary Cardiologist (physician) and Advanced Practice Providers (APPs -  Physician Assistants and Nurse Practitioners) who all work together to provide you with the care you need, when you need it.  We recommend signing up for the patient portal called "MyChart".  Sign up information is provided on this After Visit Summary.  MyChart is used to connect with patients for Virtual Visits (Telemedicine).  Patients are  able to view lab/test results, encounter notes, upcoming appointments, etc.  Non-urgent messages can be sent to your provider as well.   To learn more about what you can do with MyChart, go to [email or web address].    Your next appointment:    After TESTING  Provider:   You may see Kate Sable, MD or one of the following Advanced Practice Providers on your designated Care Team:   Murray Hodgkins,  NP Christell Faith, PA-C Cadence Kathlen Mody, PA-C Gerrie Nordmann, NP

## 2022-12-09 DIAGNOSIS — R000 Tachycardia, unspecified: Secondary | ICD-10-CM

## 2022-12-30 ENCOUNTER — Other Ambulatory Visit: Payer: Self-pay | Admitting: Internal Medicine

## 2022-12-30 DIAGNOSIS — R000 Tachycardia, unspecified: Secondary | ICD-10-CM | POA: Diagnosis not present

## 2022-12-30 DIAGNOSIS — I10 Essential (primary) hypertension: Secondary | ICD-10-CM

## 2023-01-01 ENCOUNTER — Telehealth: Payer: Self-pay

## 2023-01-01 MED ORDER — METOPROLOL SUCCINATE ER 25 MG PO TB24: 25.0000 mg | ORAL_TABLET | Freq: Every day | ORAL | 0 refills | Status: DC

## 2023-01-01 NOTE — Telephone Encounter (Signed)
-----   Message from Kate Sable, MD sent at 12/31/2022  4:46 PM EST ----- Paroxysmal SVT noted.  Start Toprol-XL 25 mg daily.  Obtain echocardiogram as scheduled.

## 2023-01-01 NOTE — Telephone Encounter (Signed)
Called patient to review results.  Patient is agreeable to start Toprol XL.  Medication list updated.  Pharmacy confirmed and medication sent in.

## 2023-01-30 ENCOUNTER — Ambulatory Visit: Payer: 59 | Attending: Cardiology

## 2023-01-30 DIAGNOSIS — R0600 Dyspnea, unspecified: Secondary | ICD-10-CM

## 2023-01-31 LAB — ECHOCARDIOGRAM COMPLETE
AR max vel: 2.28 cm2
AV Area VTI: 2.06 cm2
AV Area mean vel: 2.11 cm2
AV Mean grad: 2 mmHg
AV Peak grad: 3.8 mmHg
Ao pk vel: 0.98 m/s
Area-P 1/2: 2.99 cm2
Calc EF: 51.3 %
P 1/2 time: 554 msec
S' Lateral: 2.8 cm
Single Plane A2C EF: 52.6 %
Single Plane A4C EF: 49.7 %

## 2023-02-02 NOTE — Progress Notes (Signed)
 " Cardiology Office Note:    Date:  02/03/2023   ID:  Edit, Ricciardelli Brandy Diaz, Brandy Diaz, Brandy Diaz  PCP:  Justus Leita DEL, MD   [facility] Providers Cardiologist:  Redell Cave, MD     Referring MD: Justus Leita DEL, MD   CC: follow up of tachycardia  History of Present Illness:    Brandy Diaz is a 77 y.o. female with a hx of hypertension, GERD, venous stasis, CKD stage IIIa, anxiety.  She was initially evaluated on 12/05/2022 by Dr. Cave at the behest of her PCP for tachycardia.  Her blood pressure was elevated at this visit at 174/94 however patient reported it was typically well-controlled at home.  A monitor was arranged which showed PSVT, and Toprol  25 mg daily was started.  An echo was ordered which showed an EF of 55 to 60%, grade 1 DD, mild MR, mild to moderate TR, mild AR.  She presents today for follow up of her tachycardia. She has been feeling well since she was last evaluated in our office. She never noticed feeling bad with her tachycardia but she is aware when her heart races. Her BP is marginally elevated, however she checks her BP every other day and reports is it in the 120's consistently. She denies chest pain, palpitations, dyspnea, pnd, orthopnea, n, v, dizziness, syncope, edema, weight gain, or early satiety.   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 WITH    BREAST LUMPECTOMY  WITH NEEDLE LOCALIZATION Left 04/07/2017   Procedure: BREAST LUMPECTOMY WITH NEEDLE LOCALIZATION;  Surgeon: Nicholaus Selinda Birmingham, MD;  Location: [facility];  Service: General;  Laterality: Left;   BREAST SURGERY Left    Breast Biopsy   CATARACT EXTRACTION W/PHACO Right 06/12/2016   Procedure: CATARACT EXTRACTION PHACO AND INTRAOCULAR LENS PLACEMENT (IOC);  Surgeon: Adine Oneil Novak, MD;  Location: [facility] ORS;  Service: Ophthalmology;  Laterality: Right;  US  6.1 AP% 12.7 CDE .77 FLUID PACK LOT # [id number]   CATARACT EXTRACTION W/PHACO Left 04/02/2021   Procedure: CATARACT EXTRACTION PHACO AND INTRAOCULAR LENS PLACEMENT (IOC) LEFT MILOOP DIABETIC;  Surgeon: Jaye Fallow, MD;  Location: [facility];  Service: Ophthalmology;  Laterality: Left;  Diabetic - diet controlled 52.81 03:47.0   COLONOSCOPY  08/02/2012   Dr. Luellen - diverticuli otherwise normal   ESOPHAGOGASTRODUODENOSCOPY  08/02/2012   chronic gastritis   EYE SURGERY Right    Cataract Extraction with IOL   OOPHORECTOMY      Current Medications: Current Meds  Medication Sig   Accu-Chek Softclix Lancets lancets TEST TWICE DAILY   alendronate  (FOSAMAX ) 70 MG tablet TAKE 1 TABLET BY MOUTH WEEKLY  WITH A FULL GLASS OF WATER ON AN EMPTY STOMACH   aspirin 81 MG chewable tablet Chew by mouth daily.   atorvastatin  (LIPITOR) 10 MG tablet  TAKE 1 TABLET BY MOUTH DAILY   blood glucose meter kit and supplies Dispense based on patient and insurance preference. Use up to four times daily as needed. (FOR ICD-10 E11.9).   Calcium  Carb-Cholecalciferol (CALCIUM  500 + D) 500-125 MG-UNIT TABS Take 2 tablets by mouth daily.   fluocinonide ointment (LIDEX) 0.05 % Apply 1 application topically 2 (two) times daily.   glucose blood (ACCU-CHEK GUIDE) test strip TEST TWICE DAILY   irbesartan  (AVAPRO ) 75 MG tablet TAKE 1 TABLET BY MOUTH DAILY   metoprolol  succinate (TOPROL  XL) 25 MG 24 hr tablet Take 1 tablet (25 mg total) by mouth daily.      Allergies:   Ace inhibitors, Hydrochlorothiazide, and Triamterene-hctz   Social History   Socioeconomic History   Marital status: Married    Spouse name: Not on file   Number of children: 0   Years of education: Not on file   Highest education level: 10th grade  Occupational History   Occupation: Retired  Tobacco Use   Smoking status: Never    Passive exposure: Past   Smokeless tobacco: Never   Tobacco comments:    smoking cessation materials not required  Vaping Use   Vaping Use: Never used  Substance and Sexual Activity   Alcohol use: No    Alcohol/week: 0.0 standard drinks of alcohol   Drug use: No   Sexual activity: Not Currently  Other Topics Concern   Not on file  Social History Narrative   Not on file   Social Determinants of Health   Financial Resource Strain: Low Risk  (11/17/2022)   Overall Financial Resource Strain (CARDIA)    Difficulty of Paying Living Expenses: Not hard at all  Food Insecurity: No Food Insecurity (11/17/2022)   Hunger Vital Sign    Worried About Running Out of Food in the Last Year: Never true    Ran Out of Food in the Last Year: Never true  Transportation Needs: No Transportation Needs (11/17/2022)   PRAPARE - Administrator, Civil Service (Medical): No    Lack of Transportation (Non-Medical): No  Physical Activity: Inactive (05/07/2022)   Exercise Vital Sign    Days of Exercise per Week: 0 days    Minutes of Exercise per Session: 0 min  Stress: No Stress Concern Present (05/07/2022)   [employer] of Occupational Health - Occupational Stress Questionnaire    Feeling of Stress : Not at all  Social Connections: Moderately Integrated (05/07/2022)   Social Connection and Isolation Panel [NHANES]    Frequency of Communication with Friends and Family: More than three times a week    Frequency of Social Gatherings with Friends and Family: Twice a week    Attends Religious Services: More than 4 times per year    Active Member  of [employer] or Organizations: No    Attends Engineer, Structural: Never    Marital Status: Married     Family History: The patient's family history includes Breast cancer in her sister; Breast cancer (age of onset: 101) in her maternal aunt; Breast cancer (age of onset: 73) in her mother; CAD in her father; Diabetes in her mother; Hypertension in her mother and sister; Throat cancer in her brother.  ROS:   Please see the history of present illness.    All other systems reviewed and are negative.  EKGs/Labs/Other Studies Reviewed:    The following studies were reviewed today:  01/30/23 echo complete - 55 to 60%, grade 1 DD, mild  MR, mild to moderate TR, mild AR.  12/31/22 Zio monitor - Paroxysmal SVT noted No other significant or sustained arrhythmias.   EKG:  EKG is  ordered today.  The ekg ordered today demonstrates SR, HR 71 bpm, consistent with prior EKG tracing.  Recent Labs: 10/15/2022: ALT 26; BUN 16; Creatinine, Ser 1.37; Hemoglobin 12.6; Platelets 317; Potassium 4.5; Sodium 137; TSH 1.150  Recent Lipid Panel    Component Value Date/Time   CHOL 172 04/24/2022 1031   CHOL 218 (H) 02/14/2012 1036   TRIG 55 04/24/2022 1031   TRIG 64 02/14/2012 1036   HDL 83 04/24/2022 1031   HDL 87 (H) 02/14/2012 1036   CHOLHDL 2.1 04/24/2022 1031   VLDL 13 02/14/2012 1036   LDLCALC 78 04/24/2022 1031   LDLCALC 118 (H) 02/14/2012 1036     Risk Assessment/Calculations:                Physical Exam:    VS:  BP 126/69 Comment: home reading today  Pulse 71   Ht 5' 7 (1.702 m)   Wt 130 lb 9.6 oz (59.2 kg)   SpO2 100%   BMI 20.45 kg/m     Wt Readings from Last 3 Encounters:  02/03/23 130 lb 9.6 oz (59.2 kg)  12/05/22 131 lb 3.2 oz (59.5 kg)  11/17/22 129 lb (58.5 kg)     GEN:  Well nourished, well developed in no acute distress HEENT: Normal NECK: No JVD; No carotid bruits LYMPHATICS: No lymphadenopathy CARDIAC: RRR, no murmurs, rubs, gallops RESPIRATORY:  Clear  to auscultation without rales, wheezing or rhonchi  ABDOMEN: Soft, non-tender, non-distended MUSCULOSKELETAL:  trace non-pitting edema; No deformity  SKIN: Warm and dry, trophic skin changes around ankles  NEUROLOGIC:  Alert and oriented x 3 PSYCHIATRIC:  Normal affect   ASSESSMENT:    1. PSVT (paroxysmal supraventricular tachycardia)   2. Primary hypertension   3. Mixed hyperlipidemia   4. Stage 3a chronic kidney disease (HCC)    PLAN:    In order of problems listed above:  PSVT- Zio monitor demonstrated PSVT, Toprol  25 mg daily was added. HR today is 71 bpm. Continue with current dose of Toprol .  HTN - BP today is 140/80, however was 126/69 at home and is consistently well managed at home. She endorses frequent elevated reading in an office setting secondary to white coat HTN. Continue Irbesartan , metoprolol .  HLD - LDL 78 on 04/24/22, on Lipitor, managed by her PCP.  CKD stage 3a - careful titration of antihypertensives and avoidance of nephrotoxic agents.   Disposition - f/u with Dr. Darliss in 6 months.           Medication Adjustments/Labs and Tests Ordered: Current medicines are reviewed at length with the patient today.  Concerns regarding medicines are outlined above.  Orders Placed This Encounter  Procedures   EKG 12-Lead   No orders of the defined types were placed in this encounter.   Patient Instructions  Medication Instructions:   Your physician recommends that you continue on your current medications as directed. Please refer to the Current Medication list given to you today.  *If you need a refill on your cardiac medications before your next appointment, please call your pharmacy*   Lab Work:  NONE  If you have labs (blood work) drawn today and your tests are completely normal, you will receive your results only by: MyChart Message (if you have MyChart) OR A paper copy in the mail If you have any  lab test that is abnormal or we need to change  your treatment, we will call you to review the results.   Testing/Procedures:  NONE    Follow-Up: At [facility], you and your health needs are our priority.  As part of our continuing mission to provide you with exceptional heart care, we have created designated Provider Care Teams.  These Care Teams include your primary Cardiologist (physician) and Advanced Practice Providers (APPs -  Physician Assistants and Nurse Practitioners) who all work together to provide you with the care you need, when you need it.  We recommend signing up for the patient portal called MyChart.  Sign up information is provided on this After Visit Summary.  MyChart is used to connect with patients for Virtual Visits (Telemedicine).  Patients are able to view lab/test results, encounter notes, upcoming appointments, etc.  Non-urgent messages can be sent to your provider as well.   To learn more about what you can do with MyChart, go to [email or web address].    Your next appointment:   6 month(s)  Provider:   You may see Redell Cave, MD or one of the following Advanced Practice Providers on your designated Care Team:   Lonni Meager, NP Bernardino Bring, PA-C Cadence Franchester, PA-C Tylene Lunch, NP    Signed, Delon JAYSON Hoover, NP  02/03/2023 4:24 PM    [facility] "

## 2023-02-03 ENCOUNTER — Ambulatory Visit: Payer: 59 | Attending: Cardiology | Admitting: Cardiology

## 2023-02-03 ENCOUNTER — Encounter: Payer: Self-pay | Admitting: Physician Assistant

## 2023-02-03 ENCOUNTER — Ambulatory Visit: Payer: 59 | Admitting: Cardiology

## 2023-02-03 VITALS — BP 126/69 | HR 71 | Ht 67.0 in | Wt 130.6 lb

## 2023-02-03 DIAGNOSIS — I471 Supraventricular tachycardia, unspecified: Secondary | ICD-10-CM

## 2023-02-03 DIAGNOSIS — R000 Tachycardia, unspecified: Secondary | ICD-10-CM

## 2023-02-03 DIAGNOSIS — E782 Mixed hyperlipidemia: Secondary | ICD-10-CM | POA: Diagnosis not present

## 2023-02-03 DIAGNOSIS — N1831 Chronic kidney disease, stage 3a: Secondary | ICD-10-CM

## 2023-02-03 DIAGNOSIS — I10 Essential (primary) hypertension: Secondary | ICD-10-CM | POA: Diagnosis not present

## 2023-02-03 NOTE — Patient Instructions (Signed)
Medication Instructions:   Your physician recommends that you continue on your current medications as directed. Please refer to the Current Medication list given to you today.  *If you need a refill on your cardiac medications before your next appointment, please call your pharmacy*   Lab Work:  NONE  If you have labs (blood work) drawn today and your tests are completely normal, you will receive your results only by: MyChart Message (if you have MyChart) OR A paper copy in the mail If you have any lab test that is abnormal or we need to change your treatment, we will call you to review the results.   Testing/Procedures:  NONE   Follow-Up: At [facility], you and your health needs are our priority.  As part of our continuing mission to provide you with exceptional heart care, we have created designated Provider Care Teams.  These Care Teams include your primary Cardiologist (physician) and Advanced Practice Providers (APPs -  Physician Assistants and Nurse Practitioners) who all work together to provide you with the care you need, when you need it.  We recommend signing up for the patient portal called "MyChart".  Sign up information is provided on this After Visit Summary.  MyChart is used to connect with patients for Virtual Visits (Telemedicine).  Patients are able to view lab/test results, encounter notes, upcoming appointments, etc.  Non-urgent messages can be sent to your provider as well.   To learn more about what you can do with MyChart, go to [email or web address].    Your next appointment:   6 month(s)  Provider:   You may see Brian Agbor-Etang, MD or one of the following Advanced Practice Providers on your designated Care Team:   Christopher Berge, NP Ryan Dunn, PA-C Cadence Furth, PA-C Sheri Hammock, NP 

## 2023-02-05 ENCOUNTER — Other Ambulatory Visit: Payer: Self-pay | Admitting: Internal Medicine

## 2023-02-05 DIAGNOSIS — E1122 Type 2 diabetes mellitus with diabetic chronic kidney disease: Secondary | ICD-10-CM

## 2023-02-05 NOTE — Telephone Encounter (Signed)
Requested Prescriptions  Pending Prescriptions Disp Refills   Accu-Chek Softclix Lancets lancets [Pharmacy Med Name: Accu-Chek Softclix Lancets] 200 each 0    Sig: TEST TWICE DAILY     Endocrinology: Diabetes - Testing Supplies Passed - 02/05/2023  5:37 AM      Passed - Valid encounter within last 12 months    Recent Outpatient Visits           2 months ago Essential (primary) hypertension   [place] [facility], Jesse Sans, MD   3 months ago Tachycardia   [facility] at [facility], Jesse Sans, MD   8 months ago Hemorrhagic cystitis   [place] [facility] [place], Earley Abide, MD   9 months ago Annual physical exam   [place] [facility], Jesse Sans, MD   1 year ago Essential (primary) hypertension   [place] [facility] [place] [facility], Jesse Sans, MD       Future Appointments             In 1 week Army Melia, Jesse Sans, MD [place] at [facility]   In 6 months Kate Sable, MD [place] at [facility]

## 2023-02-16 ENCOUNTER — Encounter: Payer: Self-pay | Admitting: Internal Medicine

## 2023-02-16 ENCOUNTER — Ambulatory Visit (INDEPENDENT_AMBULATORY_CARE_PROVIDER_SITE_OTHER): Payer: 59 | Admitting: Internal Medicine

## 2023-02-16 VITALS — BP 124/64 | HR 83 | Ht 67.0 in | Wt 129.0 lb

## 2023-02-16 DIAGNOSIS — I10 Essential (primary) hypertension: Secondary | ICD-10-CM

## 2023-02-16 DIAGNOSIS — N1831 Chronic kidney disease, stage 3a: Secondary | ICD-10-CM | POA: Diagnosis not present

## 2023-02-16 DIAGNOSIS — I872 Venous insufficiency (chronic) (peripheral): Secondary | ICD-10-CM

## 2023-02-16 DIAGNOSIS — D0512 Intraductal carcinoma in situ of left breast: Secondary | ICD-10-CM

## 2023-02-16 NOTE — Assessment & Plan Note (Signed)
Clinically stable exam with well controlled BP on irbesartan and metoprolol. Tolerating medications without side effects. Pt to continue current regimen and low sodium diet.

## 2023-02-16 NOTE — Progress Notes (Signed)
Date:  02/16/2023   Name:  Brandy Diaz   DOB:  [birth year]   MRN:  [id number]   Chief Complaint: Hypertension  Hypertension This is a chronic problem. The problem is controlled. Pertinent negatives include no chest pain, headaches, palpitations or shortness of breath. Past treatments include angiotensin blockers and beta blockers. The current treatment provides significant improvement. There are no compliance problems.  Hypertensive end-organ damage includes kidney disease. There is no history of CAD/MI or CVA.   CKD - has been stable with GFR ~40 for some time.  Not taking nsaids.  Feels well with good appetite and energy.  Lab Results  Component Value Date   NA 137 10/15/2022   K 4.5 10/15/2022   CO2 25 10/15/2022   GLUCOSE 97 10/15/2022   BUN 16 10/15/2022   CREATININE 1.37 (H) 10/15/2022   CALCIUM 10.3 10/15/2022   EGFR 40 (L) 10/15/2022   GFRNONAA 46 (L) 08/17/2020   Lab Results  Component Value Date   CHOL 172 04/24/2022   HDL 83 04/24/2022   LDLCALC 78 04/24/2022   TRIG 55 04/24/2022   CHOLHDL 2.1 04/24/2022   Lab Results  Component Value Date   TSH 1.150 10/15/2022   Lab Results  Component Value Date   HGBA1C 6.1 (H) 04/24/2022   Lab Results  Component Value Date   WBC 4.7 10/15/2022   HGB 12.6 10/15/2022   HCT 38.4 10/15/2022   MCV 89 10/15/2022   PLT 317 10/15/2022   Lab Results  Component Value Date   ALT 26 10/15/2022   AST 29 10/15/2022   ALKPHOS 84 10/15/2022   BILITOT 0.4 10/15/2022   Lab Results  Component Value Date   VD25OH 40.97 05/23/2022     Review of Systems  Constitutional:  Negative for fatigue and unexpected weight change.  HENT:  Negative for nosebleeds.   Eyes:  Negative for visual disturbance.  Respiratory:  Negative for cough, chest tightness, shortness of breath and wheezing.   Cardiovascular:  Negative for chest pain, palpitations and leg swelling.  Gastrointestinal:  Negative for abdominal pain, constipation and  diarrhea.  Neurological:  Negative for dizziness, weakness, light-headedness and headaches.  Psychiatric/Behavioral:  Negative for dysphoric mood (mild sadness - she has lost all her siblings) and sleep disturbance. The patient is not nervous/anxious.     Patient Active Problem List   Diagnosis Date Noted   Tachycardia 10/15/2022   Hemorrhagic cystitis 06/06/2022   Osteoporosis 04/24/2022   Chronic idiopathic constipation 10/22/2021   Venous stasis dermatitis of both lower extremities 11/08/2019   Ductal carcinoma in situ (DCIS) of left breast 05/21/2017   Localized edema 04/28/2017   Stage 3a chronic kidney disease 01/01/2016   Essential (primary) hypertension 07/24/2015   Gastro-esophageal reflux disease without esophagitis 07/24/2015   Mixed hyperlipidemia 07/24/2015   Prediabetes 07/24/2015   Fibrocystic breast changes 07/24/2015   Anxiety 05/24/2012    Allergies  Allergen Reactions   Ace Inhibitors Other (See Comments)    Cough   Hydrochlorothiazide Other (See Comments)    Dizziness   Triamterene-Hctz Other (See Comments)    Appetite loss    Past Surgical History:  Procedure Laterality Date   ABDOMINAL HYSTERECTOMY  1980   BREAST BIOPSY Left 2018   DCIS   BREAST LUMPECTOMY Left 03/2017   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 WITH    BREAST LUMPECTOMY WITH NEEDLE LOCALIZATION Left 04/07/2017   Procedure: BREAST LUMPECTOMY WITH NEEDLE LOCALIZATION;  Surgeon: Ancil Linsey, MD;  Location: [facility];  Service: General;  Laterality: Left;   BREAST SURGERY Left    Breast Biopsy   CATARACT EXTRACTION W/PHACO Right 06/12/2016   Procedure: CATARACT EXTRACTION PHACO AND INTRAOCULAR LENS PLACEMENT (IOC);  Surgeon: Nevada Crane, MD;  Location: [facility];  Service: Ophthalmology;  Laterality: Right;  [place] 6.1 AP% 12.7 CDE .77 FLUID PACK LOT # [id number]   CATARACT EXTRACTION W/PHACO Left 04/02/2021   Procedure: CATARACT EXTRACTION PHACO AND  INTRAOCULAR LENS PLACEMENT (IOC) LEFT MILOOP DIABETIC;  Surgeon: Galen Manila, MD;  Location: [facility];  Service: Ophthalmology;  Laterality: Left;  Diabetic - diet controlled 52.81 03:47.0   COLONOSCOPY  08/02/2012   Dr. Ricki Rodriguez - diverticuli otherwise normal   ESOPHAGOGASTRODUODENOSCOPY  08/02/2012   chronic gastritis   EYE SURGERY Right    Cataract Extraction with IOL   OOPHORECTOMY      Social History   Tobacco Use   Smoking status: Never    Passive exposure: Past   Smokeless tobacco: Never   Tobacco comments:    smoking cessation materials not required  Vaping Use   Vaping Use: Never used  Substance Use Topics   Alcohol use: No    Alcohol/week: 0.0 standard drinks of alcohol   Drug use: No     Medication list has been reviewed and updated.  Current Meds  Medication Sig   Accu-Chek Softclix Lancets lancets TEST TWICE DAILY   alendronate (FOSAMAX) 70 MG tablet TAKE 1 TABLET BY MOUTH WEEKLY  WITH A FULL GLASS OF WATER ON AN EMPTY STOMACH   aspirin 81 MG chewable tablet Chew by mouth daily.   atorvastatin (LIPITOR) 10 MG tablet TAKE 1 TABLET BY MOUTH DAILY   blood glucose meter kit and supplies Dispense based on patient and insurance preference. Use up to four times daily as needed. (FOR ICD-10 E11.9).   Calcium Carb-Cholecalciferol (CALCIUM 500 + D) 500-125 MG-UNIT TABS Take 2 tablets by mouth daily.   fluocinonide ointment (LIDEX) 0.05 % Apply 1 application topically 2 (two) times daily.   glucose blood (ACCU-CHEK GUIDE) test strip TEST TWICE DAILY   irbesartan (AVAPRO) 75 MG tablet TAKE 1 TABLET BY MOUTH DAILY   metoprolol succinate (TOPROL XL) 25 MG 24 hr tablet Take 1 tablet (25 mg total) by mouth daily.       02/16/2023    2:12 PM 11/17/2022    2:23 PM 10/15/2022    2:17 PM 04/24/2022    9:29 AM  GAD 7 : Generalized Anxiety Score  Nervous, Anxious, on Edge 0 0 0 0  Control/stop worrying 0 0 0 0  Worry too much - different things 0 0 0 0  Trouble  relaxing 0 1 0 0  Restless 0 0 0 0  Easily annoyed or irritable 0 0 0 0  Afraid - awful might happen 0 0 0 0  Total GAD 7 Score 0 1 0 0  Anxiety Difficulty Not difficult at all Not difficult at all Not difficult at all Not difficult at all       02/16/2023    2:11 PM 11/17/2022    2:23 PM 10/15/2022    2:16 PM  Depression screen PHQ 2/9  Decreased Interest 1 0 0  Down, Depressed, Hopeless 1 0 0  PHQ - 2 Score 2 0 0  Altered sleeping 0 0 1  Tired, decreased energy 0 0 1  Change in appetite 0 0 0  Feeling bad or failure  about yourself  0 0 0  Trouble concentrating 0 0 0  Moving slowly or fidgety/restless 0 0 0  Suicidal thoughts 0 0 0  PHQ-9 Score 2 0 2  Difficult doing work/chores Not difficult at all Not difficult at all Not difficult at all    BP Readings from Last 3 Encounters:  02/16/23 124/64  02/03/23 126/69  12/05/22 (!) 172/90    Physical Exam Vitals and nursing note reviewed.  Constitutional:      General: She is not in acute distress.    Appearance: She is well-developed.  HENT:     Head: Normocephalic and atraumatic.  Cardiovascular:     Rate and Rhythm: Normal rate and regular rhythm.     Heart sounds: No murmur heard. Pulmonary:     Effort: Pulmonary effort is normal. No respiratory distress.     Breath sounds: No wheezing or rhonchi.  Musculoskeletal:     Right lower leg: No edema.     Left lower leg: No edema.  Skin:    General: Skin is warm and dry.     Findings: No rash.     Comments: Venous stasis changes both lower legs - minimal swelling and tenderness; skin intact bilaterally  Neurological:     General: No focal deficit present.     Mental Status: She is alert and oriented to person, place, and time.  Psychiatric:        Mood and Affect: Mood normal.        Behavior: Behavior normal.     Wt Readings from Last 3 Encounters:  02/16/23 129 lb (58.5 kg)  02/03/23 130 lb 9.6 oz (59.2 kg)  12/05/22 131 lb 3.2 oz (59.5 kg)    BP 124/64    Pulse 83   Ht 5\' 7"  (1.702 m)   Wt 129 lb (58.5 kg)   SpO2 98%   BMI 20.20 kg/m   Assessment and Plan:  Problem List Items Addressed This Visit       Cardiovascular and Mediastinum   Venous stasis dermatitis of both lower extremities    Skin changes are stable with no weeping or open ulcers      Essential (primary) hypertension - Primary (Chronic)    Clinically stable exam with well controlled BP on irbesartan and metoprolol. Tolerating medications without side effects. Pt to continue current regimen and low sodium diet.         Genitourinary   Stage 3a chronic kidney disease (Chronic)    Monitoring regularly GFR around 40       Relevant Orders   Basic metabolic panel     Other   Ductal carcinoma in situ (DCIS) of left breast (Chronic)    Followed by Oncology until last year. Per their note "Given that she is now 5 years out of her DCIS she does not require any further follow-up with me" Last mammogram 09/2022       Return in about 4 months (around 06/18/2023) for HTN, cholesterol - fasting.   Partially dictated using Dragon software, any errors are not intentional.  Reubin MilanLaura H. Analiah Drum, MD [facility] [place], [place]

## 2023-02-16 NOTE — Assessment & Plan Note (Signed)
Monitoring regularly GFR around 40

## 2023-02-16 NOTE — Assessment & Plan Note (Signed)
Skin changes are stable with no weeping or open ulcers

## 2023-02-16 NOTE — Assessment & Plan Note (Addendum)
Followed by Oncology until last year. Per their note "Given that she is now 5 years out of her DCIS she does not require any further follow-up with me" Last mammogram 09/2022

## 2023-02-17 LAB — BASIC METABOLIC PANEL
BUN/Creatinine Ratio: 13 (ref 12–28)
BUN: 15 mg/dL (ref 8–27)
CO2: 21 mmol/L (ref 20–29)
Calcium: 9.7 mg/dL (ref 8.7–10.3)
Chloride: 102 mmol/L (ref 96–106)
Creatinine, Ser: 1.18 mg/dL — ABNORMAL HIGH (ref 0.57–1.00)
Glucose: 95 mg/dL (ref 70–99)
Potassium: 4.5 mmol/L (ref 3.5–5.2)
Sodium: 141 mmol/L (ref 134–144)
eGFR: 47 mL/min/{1.73_m2} — ABNORMAL LOW (ref >59)

## 2023-03-12 ENCOUNTER — Other Ambulatory Visit: Payer: Self-pay | Admitting: Oncology

## 2023-03-28 ENCOUNTER — Other Ambulatory Visit: Payer: Self-pay | Admitting: Internal Medicine

## 2023-03-28 DIAGNOSIS — R7303 Prediabetes: Secondary | ICD-10-CM

## 2023-03-30 ENCOUNTER — Other Ambulatory Visit: Payer: Self-pay

## 2023-03-30 MED ORDER — METOPROLOL SUCCINATE ER 25 MG PO TB24: 25.0000 mg | ORAL_TABLET | Freq: Every day | ORAL | 3 refills | Status: DC

## 2023-04-16 ENCOUNTER — Other Ambulatory Visit: Payer: Self-pay | Admitting: Internal Medicine

## 2023-04-16 DIAGNOSIS — E1122 Type 2 diabetes mellitus with diabetic chronic kidney disease: Secondary | ICD-10-CM

## 2023-04-29 ENCOUNTER — Telehealth: Payer: Self-pay | Admitting: Internal Medicine

## 2023-04-29 NOTE — Telephone Encounter (Signed)
Copied from CRM [id number]. Topic: Medicare AWV >> Apr 29, 2023  2:43 PM Payton Doughty wrote: Reason for CRM: LM 04/29/2023 to schedule AWV   Verlee Rossetti; Care Guide Ambulatory Clinical Support [place] [facility] Direct Dial: [phone]

## 2023-05-07 ENCOUNTER — Other Ambulatory Visit: Payer: Self-pay | Admitting: Internal Medicine

## 2023-05-07 DIAGNOSIS — E782 Mixed hyperlipidemia: Secondary | ICD-10-CM

## 2023-06-03 ENCOUNTER — Telehealth: Payer: Self-pay | Admitting: Cardiology

## 2023-06-03 ENCOUNTER — Ambulatory Visit (INDEPENDENT_AMBULATORY_CARE_PROVIDER_SITE_OTHER): Payer: 59

## 2023-06-03 VITALS — Ht 67.0 in | Wt 129.0 lb

## 2023-06-03 DIAGNOSIS — Z0000 Encounter for general adult medical examination without abnormal findings: Secondary | ICD-10-CM

## 2023-06-03 NOTE — Telephone Encounter (Signed)
Called and spoke with patient. Informed patient that she was started on the Metoprolol based on the results from her heart monitor. Patient informed that the heart monitor showed Paroxysmal SVT. Patient verbalized understanding.

## 2023-06-03 NOTE — Patient Instructions (Signed)
Brandy Diaz , Thank you for taking time to come for your Medicare Wellness Visit. I appreciate your ongoing commitment to your health goals. Please review the following plan we discussed and let me know if I can assist you in the future.   These are the goals we discussed:  Goals      DIET - EAT MORE FRUITS AND VEGETABLES     DIET - INCREASE WATER INTAKE     Recommend to drink at least 6-8 8oz glasses of water per day.        This is a list of the screening recommended for you and due dates:  Health Maintenance  Topic Date Due   Zoster (Shingles) Vaccine (1 of 2) Never done   COVID-19 Vaccine (4 - 2023-24 season) 07/11/2022   DTaP/Tdap/Td vaccine (3 - Td or Tdap) 05/12/2023   Flu Shot  06/11/2023   Mammogram  10/02/2023   Medicare Annual Wellness Visit  06/02/2024   Pneumonia Vaccine  Completed   DEXA scan (bone density measurement)  Completed   Hepatitis C Screening  Completed   HPV Vaccine  Aged Out   Colon Cancer Screening  Discontinued    Advanced directives: no  Conditions/risks identified: none  Next appointment: Follow up in one year for your annual wellness visit 06/08/24 @ 1:30 pm by phone   Preventive Care 65 Years and Older, Female Preventive care refers to lifestyle choices and visits with your health care provider that can promote health and wellness. What does preventive care include? A yearly physical exam. This is also called an annual well check. Dental exams once or twice a year. Routine eye exams. Ask your health care provider how often you should have your eyes checked. Personal lifestyle choices, including: Daily care of your teeth and gums. Regular physical activity. Eating a healthy diet. Avoiding tobacco and drug use. Limiting alcohol use. Practicing safe sex. Taking low-dose aspirin every day. Taking vitamin and mineral supplements as recommended by your health care provider. What happens during an annual well check? The services and  screenings done by your health care provider during your annual well check will depend on your age, overall health, lifestyle risk factors, and family history of disease. Counseling  Your health care provider may ask you questions about your: Alcohol use. Tobacco use. Drug use. Emotional well-being. Home and relationship well-being. Sexual activity. Eating habits. History of falls. Memory and ability to understand (cognition). Work and work Astronomer. Reproductive health. Screening  You may have the following tests or measurements: Height, weight, and BMI. Blood pressure. Lipid and cholesterol levels. These may be checked every 5 years, or more frequently if you are over 59 years old. Skin check. Lung cancer screening. You may have this screening every year starting at age 24 if you have a 30-pack-year history of smoking and currently smoke or have quit within the past 15 years. Fecal occult blood test (FOBT) of the stool. You may have this test every year starting at age 52. Flexible sigmoidoscopy or colonoscopy. You may have a sigmoidoscopy every 5 years or a colonoscopy every 10 years starting at age 33. Hepatitis C blood test. Hepatitis B blood test. Sexually transmitted disease (STD) testing. Diabetes screening. This is done by checking your blood sugar (glucose) after you have not eaten for a while (fasting). You may have this done every 1-3 years. Bone density scan. This is done to screen for osteoporosis. You may have this done starting at age 15. Mammogram. This  may be done every 1-2 years. Talk to your health care provider about how often you should have regular mammograms. Talk with your health care provider about your test results, treatment options, and if necessary, the need for more tests. Vaccines  Your health care provider may recommend certain vaccines, such as: Influenza vaccine. This is recommended every year. Tetanus, diphtheria, and acellular pertussis (Tdap,  Td) vaccine. You may need a Td booster every 10 years. Zoster vaccine. You may need this after age 27. Pneumococcal 13-valent conjugate (PCV13) vaccine. One dose is recommended after age 104. Pneumococcal polysaccharide (PPSV23) vaccine. One dose is recommended after age 14. Talk to your health care provider about which screenings and vaccines you need and how often you need them. This information is not intended to replace advice given to you by your health care provider. Make sure you discuss any questions you have with your health care provider. Document Released: 11/23/2015 Document Revised: 07/16/2016 Document Reviewed: 08/28/2015 Elsevier Interactive Patient Education  2017 [employer].  Fall Prevention in the Home Falls can cause injuries. They can happen to people of all ages. There are many things you can do to make your home safe and to help prevent falls. What can I do on the outside of my home? Regularly fix the edges of walkways and driveways and fix any cracks. Remove anything that might make you trip as you walk through a door, such as a raised step or threshold. Trim any bushes or trees on the path to your home. Use bright outdoor lighting. Clear any walking paths of anything that might make someone trip, such as rocks or tools. Regularly check to see if handrails are loose or broken. Make sure that both sides of any steps have handrails. Any raised decks and porches should have guardrails on the edges. Have any leaves, snow, or ice cleared regularly. Use sand or salt on walking paths during winter. Clean up any spills in your garage right away. This includes oil or grease spills. What can I do in the bathroom? Use night lights. Install grab bars by the toilet and in the tub and shower. Do not use towel bars as grab bars. Use non-skid mats or decals in the tub or shower. If you need to sit down in the shower, use a plastic, non-slip stool. Keep the floor dry. Clean up any  water that spills on the floor as soon as it happens. Remove soap buildup in the tub or shower regularly. Attach bath mats securely with double-sided non-slip rug tape. Do not have throw rugs and other things on the floor that can make you trip. What can I do in the bedroom? Use night lights. Make sure that you have a light by your bed that is easy to reach. Do not use any sheets or blankets that are too big for your bed. They should not hang down onto the floor. Have a firm chair that has side arms. You can use this for support while you get dressed. Do not have throw rugs and other things on the floor that can make you trip. What can I do in the kitchen? Clean up any spills right away. Avoid walking on wet floors. Keep items that you use a lot in easy-to-reach places. If you need to reach something above you, use a strong step stool that has a grab bar. Keep electrical cords out of the way. Do not use floor polish or wax that makes floors slippery. If you must  use wax, use non-skid floor wax. Do not have throw rugs and other things on the floor that can make you trip. What can I do with my stairs? Do not leave any items on the stairs. Make sure that there are handrails on both sides of the stairs and use them. Fix handrails that are broken or loose. Make sure that handrails are as long as the stairways. Check any carpeting to make sure that it is firmly attached to the stairs. Fix any carpet that is loose or worn. Avoid having throw rugs at the top or bottom of the stairs. If you do have throw rugs, attach them to the floor with carpet tape. Make sure that you have a light switch at the top of the stairs and the bottom of the stairs. If you do not have them, ask someone to add them for you. What else can I do to help prevent falls? Wear shoes that: Do not have high heels. Have rubber bottoms. Are comfortable and fit you well. Are closed at the toe. Do not wear sandals. If you use a  stepladder: Make sure that it is fully opened. Do not climb a closed stepladder. Make sure that both sides of the stepladder are locked into place. Ask someone to hold it for you, if possible. Clearly mark and make sure that you can see: Any grab bars or handrails. First and last steps. Where the edge of each step is. Use tools that help you move around (mobility aids) if they are needed. These include: Canes. Walkers. Scooters. Crutches. Turn on the lights when you go into a dark area. Replace any light bulbs as soon as they burn out. Set up your furniture so you have a clear path. Avoid moving your furniture around. If any of your floors are uneven, fix them. If there are any pets around you, be aware of where they are. Review your medicines with your doctor. Some medicines can make you feel dizzy. This can increase your chance of falling. Ask your doctor what other things that you can do to help prevent falls. This information is not intended to replace advice given to you by your health care provider. Make sure you discuss any questions you have with your health care provider. Document Released: 08/23/2009 Document Revised: 04/03/2016 Document Reviewed: 12/01/2014 Elsevier Interactive Patient Education  2017 [employer].

## 2023-06-03 NOTE — Progress Notes (Signed)
Subjective:   Brandy Diaz is a 77 y.o. female who presents for Medicare Annual (Subsequent) preventive examination.  Per patient no change in vitals since last visit; unable to obtain new vitals due to this being a telehealth visit. 06/03/23 Patient was unable to self-report vital signs via telehealth due to a lack of equipment at home.   Visit Complete: Virtual  I connected with  Brandy Diaz on 06/03/23 by a audio enabled telemedicine application and verified that I am speaking with the correct person using two identifiers.  Patient Location: Home  Provider Location: Office/Clinic  I discussed the limitations of evaluation and management by telemedicine. The patient expressed understanding and agreed to proceed.  Review of Systems     Cardiac Risk Factors include: advanced age (>36men, >61 women);dyslipidemia;hypertension;sedentary lifestyle     Objective:    Today's Vitals   06/03/23 1319  Weight: 129 lb (58.5 kg)  Height: 5\' 7"  (1.702 m)   Body mass index is 20.2 kg/m.     06/03/2023    1:10 PM 05/23/2022    2:21 PM 05/07/2022   10:21 AM 11/22/2021    2:17 PM 08/30/2021   11:08 AM 05/06/2021   10:15 AM 04/02/2021    6:45 AM  Advanced Directives  Does Patient Have a Medical Advance Directive? No No No No No No No  Would patient like information on creating a medical advance directive? No - Patient declined No - Patient declined No - Patient declined No - Patient declined No - Patient declined No - Patient declined No - Patient declined    Current Medications (verified) Outpatient Encounter Medications as of 06/03/2023  Medication Sig   Accu-Chek Softclix Lancets lancets TEST TWICE DAILY   alendronate (FOSAMAX) 70 MG tablet TAKE 1 TABLET BY MOUTH WEEKLY  WITH 8 OZ OF PLAIN WATER 30  MINUTES BEFORE FIRST FOOD, DRINK OR MEDS. STAY UPRIGHT FOR 30  MINS   aspirin 81 MG chewable tablet Chew by mouth daily.   atorvastatin (LIPITOR) 10 MG tablet TAKE 1 TABLET BY  MOUTH DAILY   blood glucose meter kit and supplies Dispense based on patient and insurance preference. Use up to four times daily as needed. (FOR ICD-10 E11.9).   Calcium Carb-Cholecalciferol (CALCIUM 500 + D) 500-125 MG-UNIT TABS Take 2 tablets by mouth daily.   fluocinonide ointment (LIDEX) 0.05 % Apply 1 application topically 2 (two) times daily.   glucose blood (ACCU-CHEK GUIDE) test strip USE TO TEST TWICE DAILY   irbesartan (AVAPRO) 75 MG tablet TAKE 1 TABLET BY MOUTH DAILY   metoprolol succinate (TOPROL XL) 25 MG 24 hr tablet Take 1 tablet (25 mg total) by mouth daily.   No facility-administered encounter medications on file as of 06/03/2023.    Allergies (verified) Ace inhibitors, Hydrochlorothiazide, and Triamterene-hctz   History: Past Medical History:  Diagnosis Date   Anemia    Breast cancer (HCC) 2018   Left   Diabetes mellitus without complication (HCC)    diet controlled   Edema    ankles   GERD (gastroesophageal reflux disease)    Hypertension    Mood disorder (HCC) 08/21/2020   Unexplained weight loss 07/24/2015   EGD and Colonoscopy normal 2014    Wears dentures    Full lower, partial upper   Past Surgical History:  Procedure Laterality Date   ABDOMINAL HYSTERECTOMY  1980   BREAST BIOPSY Left 2018   DCIS   BREAST LUMPECTOMY Left 03/2017   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 WITH    BREAST LUMPECTOMY WITH NEEDLE LOCALIZATION Left 04/07/2017   Procedure: BREAST LUMPECTOMY WITH NEEDLE LOCALIZATION;  Surgeon: Ancil Linsey, MD;  Location: [facility];  Service: General;  Laterality: Left;   BREAST SURGERY Left    Breast Biopsy   CATARACT EXTRACTION W/PHACO Right 06/12/2016   Procedure: CATARACT EXTRACTION PHACO AND INTRAOCULAR LENS PLACEMENT (IOC);  Surgeon: Nevada Crane, MD;  Location: [facility];  Service: Ophthalmology;  Laterality: Right;  [place] 6.1 AP% 12.7 CDE .77 FLUID PACK LOT # [id number]   CATARACT EXTRACTION W/PHACO Left  04/02/2021   Procedure: CATARACT EXTRACTION PHACO AND INTRAOCULAR LENS PLACEMENT (IOC) LEFT MILOOP DIABETIC;  Surgeon: Galen Manila, MD;  Location: [facility];  Service: Ophthalmology;  Laterality: Left;  Diabetic - diet controlled 52.81 03:47.0   COLONOSCOPY  08/02/2012   Dr. Ricki Rodriguez - diverticuli otherwise normal   ESOPHAGOGASTRODUODENOSCOPY  08/02/2012   chronic gastritis   EYE SURGERY Right    Cataract Extraction with IOL   OOPHORECTOMY     Family History  Problem Relation Age of Onset   Breast cancer Mother 42   Diabetes Mother    Hypertension Mother    CAD Father    Breast cancer Sister    Hypertension Sister    Breast cancer Maternal Aunt 50   Throat cancer Brother    Social History   Socioeconomic History   Marital status: Married    Spouse name: Not on file   Number of children: 0   Years of education: Not on file   Highest education level: 10th grade  Occupational History   Occupation: Retired  Tobacco Use   Smoking status: Never    Passive exposure: Past   Smokeless tobacco: Never   Tobacco comments:    smoking cessation materials not required  Vaping Use   Vaping status: Never Used  Substance and Sexual Activity   Alcohol use: No    Alcohol/week: 0.0 standard drinks of alcohol   Drug use: No   Sexual activity: Not Currently  Other Topics Concern   Not on file  Social History Narrative   Not on file   Social Determinants of Health   Financial Resource Strain: Low Risk  (06/03/2023)   Overall Financial Resource Strain (CARDIA)    Difficulty of Paying Living Expenses: Not hard at all  Food Insecurity: No Food Insecurity (06/03/2023)   Hunger Vital Sign    Worried About Running Out of Food in the Last Year: Never true    Ran Out of Food in the Last Year: Never true  Transportation Needs: No Transportation Needs (06/03/2023)   PRAPARE - Administrator, Civil Service (Medical): No    Lack of Transportation (Non-Medical): No   Physical Activity: Inactive (06/03/2023)   Exercise Vital Sign    Days of Exercise per Week: 0 days    Minutes of Exercise per Session: 0 min  Stress: No Stress Concern Present (06/03/2023)   [employer] of Occupational Health - Occupational Stress Questionnaire    Feeling of Stress : Not at all  Social Connections: Moderately Isolated (06/03/2023)   Social Connection and Isolation Panel [NHANES]    Frequency of Communication with Friends and Family: Once a week    Frequency of Social Gatherings with Friends and Family: Twice a week    Attends Religious Services: Never    Database administrator or Organizations: No    Attends Banker  Meetings: Never    Marital Status: Married    Tobacco Counseling Counseling given: Not Answered Tobacco comments: smoking cessation materials not required   Clinical Intake:  Pre-visit preparation completed: Yes  Pain : No/denies pain     Nutritional Risks: None Diabetes: Yes CBG done?: No Did pt. bring in CBG monitor from home?: No  How often do you need to have someone help you when you read instructions, pamphlets, or other written materials from your doctor or pharmacy?: 1 - Never  Interpreter Needed?: No  Information entered by :: Kennedy Bucker, LPN   Activities of Daily Living    06/03/2023    1:11 PM  In your present state of health, do you have any difficulty performing the following activities:  Hearing? 0  Vision? 0  Difficulty concentrating or making decisions? 0  Walking or climbing stairs? 0  Dressing or bathing? 0  Doing errands, shopping? 0  Preparing Food and eating ? N  Using the Toilet? N  In the past six months, have you accidently leaked urine? N  Do you have problems with loss of bowel control? N  Managing your Medications? N  Managing your Finances? N  Housekeeping or managing your Housekeeping? N    Patient Care Team: Reubin Milan, MD as PCP - General (Internal  Medicine) Debbe Odea, MD as PCP - Cardiology (Cardiology) Creig Hines, MD as Consulting Physician (Oncology) Nevada Crane, MD as Consulting Physician (Ophthalmology) Sherrin Daisy, MD as Referring Physician (Dermatology)  Indicate any recent Medical Services you may have received from other than [facility] providers in the past year (date may be approximate).     Assessment:   This is a routine wellness examination for Brandy Diaz.  Hearing/Vision screen Hearing Screening - Comments:: No aids Vision Screening - Comments:: Read[facility]- [facility]  Dietary issues and exercise activities discussed:     Goals Addressed             This Visit's Progress    DIET - EAT MORE FRUITS AND VEGETABLES         Depression Screen    06/03/2023    1:08 PM 02/16/2023    2:11 PM 11/17/2022    2:23 PM 10/15/2022    2:16 PM 05/07/2022   10:20 AM 04/24/2022    9:29 AM 10/22/2021    9:18 AM  PHQ 2/9 Scores  PHQ - 2 Score 0 2 0 0 2 1 0  PHQ- 9 Score 0 2 0 2 4 3  0    Fall Risk    06/03/2023    1:11 PM 02/16/2023    2:11 PM 11/17/2022    2:24 PM 10/15/2022    2:17 PM 05/07/2022   10:22 AM  Fall Risk   Falls in the past year? 0 0 0 0 0  Number falls in past yr: 0 0 0 0 0  Injury with Fall? 0 0 0 0 0  Risk for fall due to : No Fall Risks No Fall Risks No Fall Risks No Fall Risks No Fall Risks  Follow up Falls prevention discussed;Falls evaluation completed Falls evaluation completed Falls evaluation completed Falls evaluation completed Falls prevention discussed    MEDICARE RISK AT HOME:  Medicare Risk at Home - 06/03/23 1311     Any stairs in or around the home? Yes    If so, are there any without handrails? No    Home free of loose throw rugs in walkways, pet beds, electrical cords,  etc? Yes    Adequate lighting in your home to reduce risk of falls? Yes    Life alert? No    Use of a cane, walker or w/c? No    Grab bars in the bathroom? No    Shower chair or bench in shower? No     Elevated toilet seat or a handicapped toilet? No             TIMED UP AND GO:  Was the test performed?  No    Cognitive Function:        06/03/2023    1:12 PM 04/11/2020    2:51 PM 04/11/2019    1:41 PM 04/07/2018    1:53 PM 01/19/2017   10:47 AM  6CIT Screen  What Year? 0 points 0 points 0 points 0 points 0 points  What month? 0 points 0 points 0 points 0 points 0 points  What time? 0 points 0 points 0 points 0 points 0 points  Count back from 20 4 points 0 points 0 points 0 points 0 points  Months in reverse 2 points 0 points 0 points 0 points 2 points  Repeat phrase 0 points 4 points 6 points 4 points 0 points  Total Score 6 points 4 points 6 points 4 points 2 points    Immunizations Immunization History  Administered Date(s) Administered   Fluad Quad(high Dose 65+) 08/21/2020, 08/26/2022   Influenza Nasal 08/12/2019   Influenza Split 08/24/2017   Influenza, High Dose Seasonal PF 08/18/2017, 08/16/2018, 08/12/2019   Influenza-Unspecified 08/24/2016, 08/24/2017, 08/08/2021   Moderna Sars-Covid-2 Vaccination 02/20/2020, 03/21/2020   PFIZER Comirnaty(Gray Top)Covid-19 Tri-Sucrose Vaccine 10/08/2020   Pneumococcal Conjugate-13 12/27/2014   Pneumococcal Polysaccharide-23 11/11/2012, 05/05/2013, 05/05/2013   RSV,unspecified 08/26/2022   Tdap 01/20/2013, 05/11/2013    TDAP status: Due, Education has been provided regarding the importance of this vaccine. Advised may receive this vaccine at local pharmacy or Health Dept. Aware to provide a copy of the vaccination record if obtained from local pharmacy or Health Dept. Verbalized acceptance and understanding.  Flu Vaccine status: Up to date  Pneumococcal vaccine status: Up to date  Covid-19 vaccine status: Completed vaccines  Qualifies for Shingles Vaccine? Yes   Zostavax completed No   Shingrix Completed?: No.    Education has been provided regarding the importance of this vaccine. Patient has been advised to call  insurance company to determine out of pocket expense if they have not yet received this vaccine. Advised may also receive vaccine at local pharmacy or Health Dept. Verbalized acceptance and understanding.  Screening Tests Health Maintenance  Topic Date Due   Zoster Vaccines- Shingrix (1 of 2) Never done   COVID-19 Vaccine (4 - 2023-24 season) 07/11/2022   DTaP/Tdap/Td (3 - Td or Tdap) 05/12/2023   INFLUENZA VACCINE  06/11/2023   MAMMOGRAM  10/02/2023   Medicare Annual Wellness (AWV)  06/02/2024   Pneumonia Vaccine 65+ Years old  Completed   DEXA SCAN  Completed   Hepatitis C Screening  Completed   HPV VACCINES  Aged Out   Colonoscopy  Discontinued    Health Maintenance  Health Maintenance Due  Topic Date Due   Zoster Vaccines- Shingrix (1 of 2) Never done   COVID-19 Vaccine (4 - 2023-24 season) 07/11/2022   DTaP/Tdap/Td (3 - Td or Tdap) 05/12/2023    Colorectal cancer screening: No longer required.   Mammogram status: No longer required due to age. Had one 10/01/22  Bone Density status: Completed 09/30/21. Results  reflect: Bone density results: OSTEOPENIA. Repeat every 2 years.  Lung Cancer Screening: (Low Dose CT Chest recommended if Age 65-80 years, 20 pack-year currently smoking OR have quit w/in 15years.) does not qualify.    Additional Screening:  Hepatitis C Screening: does qualify; Completed 04/22/21  Vision Screening: Recommended annual ophthalmology exams for early detection of glaucoma and other disorders of the eye. Is the patient up to date with their annual eye exam?  Yes  Who is the provider or what is the name of the office in which the patient attends annual eye exams? [facility] If pt is not established with a provider, would they like to be referred to a provider to establish care? No .   Dental Screening: Recommended annual dental exams for proper oral hygiene  Community Resource Referral / Chronic Care Management: CRR required this visit?  No    CCM required this visit?  No     Plan:     I have personally reviewed and noted the following in the patient's chart:   Medical and social history Use of alcohol, tobacco or illicit drugs  Current medications and supplements including opioid prescriptions. Patient is not currently taking opioid prescriptions. Functional ability and status Nutritional status Physical activity Advanced directives List of other physicians Hospitalizations, surgeries, and ER visits in previous 12 months Vitals Screenings to include cognitive, depression, and falls Referrals and appointments  In addition, I have reviewed and discussed with patient certain preventive protocols, quality metrics, and best practice recommendations. A written personalized care plan for preventive services as well as general preventive health recommendations were provided to patient.     Hal Hope, LPN   [id number]   After Visit Summary: (MyChart) Due to this being a telephonic visit, the after visit summary with patients personalized plan was offered to patient via MyChart   Nurse Notes: none

## 2023-06-03 NOTE — Telephone Encounter (Signed)
Pt c/o medication issue:  1. Name of Medication:  metoprolol succinate (TOPROL XL) 25 MG 24 hr tablet  2. How are you currently taking this medication (dosage and times per day)?   3. Are you having a reaction (difficulty breathing--STAT)?   4. What is your medication issue?   Patient would like to know why she was prescribed this medication. Please advise.

## 2023-06-19 ENCOUNTER — Ambulatory Visit: Payer: 59 | Admitting: Internal Medicine

## 2023-06-23 ENCOUNTER — Encounter: Payer: Self-pay | Admitting: Internal Medicine

## 2023-06-23 ENCOUNTER — Ambulatory Visit (INDEPENDENT_AMBULATORY_CARE_PROVIDER_SITE_OTHER): Payer: 59 | Admitting: Internal Medicine

## 2023-06-23 VITALS — BP 122/68 | HR 68 | Ht 67.0 in | Wt 129.0 lb

## 2023-06-23 DIAGNOSIS — E782 Mixed hyperlipidemia: Secondary | ICD-10-CM | POA: Diagnosis not present

## 2023-06-23 DIAGNOSIS — I10 Essential (primary) hypertension: Secondary | ICD-10-CM

## 2023-06-23 DIAGNOSIS — R040 Epistaxis: Secondary | ICD-10-CM

## 2023-06-23 DIAGNOSIS — R7303 Prediabetes: Secondary | ICD-10-CM

## 2023-06-23 NOTE — Assessment & Plan Note (Addendum)
Glucoses managed with diet alone. Lab Results  Component Value Date   HGBA1C 6.1 (H) 04/24/2022  Recently completed an in-home urine collection that sounds like it may have been a urine protein sent by her insurance company. She received a letter saying everything was normal.

## 2023-06-23 NOTE — Progress Notes (Signed)
Date:  06/23/2023   Name:  Brandy Diaz   DOB:  1946-05-24   MRN:  [id number]   Chief Complaint: Hypertension and Hyperlipidemia  Hypertension This is a chronic problem. The problem is controlled. Pertinent negatives include no chest pain, headaches, palpitations or shortness of breath. Past treatments include angiotensin blockers and beta blockers. The current treatment provides significant improvement. Hypertensive end-organ damage includes kidney disease. There is no history of CAD/MI or CVA.  Hyperlipidemia This is a chronic problem. The problem is controlled. Pertinent negatives include no chest pain or shortness of breath. Current antihyperlipidemic treatment includes statins. The current treatment provides significant improvement of lipids.  Diabetes She presents for her follow-up diabetic visit. Diabetes type: prediabetes. Pertinent negatives for hypoglycemia include no dizziness, headaches or nervousness/anxiousness. Pertinent negatives for diabetes include no chest pain, no fatigue and no weakness. Pertinent negatives for diabetic complications include no CVA.  Nosebleeds - had one about a month ago - stopped in about 10 minutes then another one yesterday that stopped very quickly.  Her nose feels dry inside but no other symptoms.   Lab Results  Component Value Date   NA 141 02/16/2023   K 4.5 02/16/2023   CO2 21 02/16/2023   GLUCOSE 95 02/16/2023   BUN 15 02/16/2023   CREATININE 1.18 (H) 02/16/2023   CALCIUM 9.7 02/16/2023   EGFR 47 (L) 02/16/2023   GFRNONAA 46 (L) 08/17/2020   Lab Results  Component Value Date   CHOL 172 04/24/2022   HDL 83 04/24/2022   LDLCALC 78 04/24/2022   TRIG 55 04/24/2022   CHOLHDL 2.1 04/24/2022   Lab Results  Component Value Date   TSH 1.150 10/15/2022   Lab Results  Component Value Date   HGBA1C 6.1 (H) 04/24/2022   Lab Results  Component Value Date   WBC 4.7 10/15/2022   HGB 12.6 10/15/2022   HCT 38.4 10/15/2022   MCV 89  10/15/2022   PLT 317 10/15/2022   Lab Results  Component Value Date   ALT 26 10/15/2022   AST 29 10/15/2022   ALKPHOS 84 10/15/2022   BILITOT 0.4 10/15/2022   Lab Results  Component Value Date   VD25OH 40.97 05/23/2022     Review of Systems  Constitutional:  Negative for fatigue and unexpected weight change.  HENT:  Positive for nosebleeds. Negative for trouble swallowing.   Eyes:  Negative for visual disturbance.  Respiratory:  Negative for cough, chest tightness, shortness of breath and wheezing.   Cardiovascular:  Negative for chest pain, palpitations and leg swelling.  Gastrointestinal:  Negative for abdominal pain, constipation and diarrhea.  Neurological:  Negative for dizziness, weakness, light-headedness and headaches.  Psychiatric/Behavioral:  Negative for dysphoric mood and sleep disturbance. The patient is not nervous/anxious.     Patient Active Problem List   Diagnosis Date Noted   Tachycardia 10/15/2022   Hemorrhagic cystitis 06/06/2022   Osteoporosis 04/24/2022   Chronic idiopathic constipation 10/22/2021   Venous stasis dermatitis of both lower extremities 11/08/2019   Ductal carcinoma in situ (DCIS) of left breast 05/21/2017   Localized edema 04/28/2017   Stage 3a chronic kidney disease (HCC) 01/01/2016   Essential (primary) hypertension 07/24/2015   Gastro-esophageal reflux disease without esophagitis 07/24/2015   Mixed hyperlipidemia 07/24/2015   Prediabetes 07/24/2015   Fibrocystic breast changes 07/24/2015   Anxiety 05/24/2012    Allergies  Allergen Reactions   Ace Inhibitors Other (See Comments)    Cough   Hydrochlorothiazide Other (See Comments)  Dizziness   Triamterene-Hctz Other (See Comments)    Appetite loss    Past Surgical History:  Procedure Laterality Date   ABDOMINAL HYSTERECTOMY  1980   BREAST BIOPSY Left 2018   DCIS   BREAST LUMPECTOMY Left 03/2017   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 WITH     BREAST LUMPECTOMY WITH NEEDLE LOCALIZATION Left 04/07/2017   Procedure: BREAST LUMPECTOMY WITH NEEDLE LOCALIZATION;  Surgeon: Ancil Linsey, MD;  Location: [facility];  Service: General;  Laterality: Left;   BREAST SURGERY Left    Breast Biopsy   CATARACT EXTRACTION W/PHACO Right 06/12/2016   Procedure: CATARACT EXTRACTION PHACO AND INTRAOCULAR LENS PLACEMENT (IOC);  Surgeon: Nevada Crane, MD;  Location: [facility];  Service: Ophthalmology;  Laterality: Right;  [place] 6.1 AP% 12.7 CDE .77 FLUID PACK LOT # [id number]   CATARACT EXTRACTION W/PHACO Left 04/02/2021   Procedure: CATARACT EXTRACTION PHACO AND INTRAOCULAR LENS PLACEMENT (IOC) LEFT MILOOP DIABETIC;  Surgeon: Galen Manila, MD;  Location: [facility];  Service: Ophthalmology;  Laterality: Left;  Diabetic - diet controlled 52.81 03:47.0   COLONOSCOPY  08/02/2012   Dr. Ricki Rodriguez - diverticuli otherwise normal   ESOPHAGOGASTRODUODENOSCOPY  08/02/2012   chronic gastritis   EYE SURGERY Right    Cataract Extraction with IOL   OOPHORECTOMY      Social History   Tobacco Use   Smoking status: Never    Passive exposure: Past   Smokeless tobacco: Never   Tobacco comments:    smoking cessation materials not required  Vaping Use   Vaping status: Never Used  Substance Use Topics   Alcohol use: No    Alcohol/week: 0.0 standard drinks of alcohol   Drug use: No     Medication list has been reviewed and updated.  Current Meds  Medication Sig   Accu-Chek Softclix Lancets lancets TEST TWICE DAILY   alendronate (FOSAMAX) 70 MG tablet TAKE 1 TABLET BY MOUTH WEEKLY  WITH 8 OZ OF PLAIN WATER 30  MINUTES BEFORE FIRST FOOD, DRINK OR MEDS. STAY UPRIGHT FOR 30  MINS   aspirin 81 MG chewable tablet Chew by mouth daily.   atorvastatin (LIPITOR) 10 MG tablet TAKE 1 TABLET BY MOUTH DAILY   blood glucose meter kit and supplies Dispense based on patient and insurance preference. Use up to four times daily as needed. (FOR ICD-10 E11.9).    Calcium Carb-Cholecalciferol (CALCIUM 500 + D) 500-125 MG-UNIT TABS Take 2 tablets by mouth daily.   fluocinonide ointment (LIDEX) 0.05 % Apply 1 application topically 2 (two) times daily.   glucose blood (ACCU-CHEK GUIDE) test strip USE TO TEST TWICE DAILY   irbesartan (AVAPRO) 75 MG tablet TAKE 1 TABLET BY MOUTH DAILY   metoprolol succinate (TOPROL XL) 25 MG 24 hr tablet Take 1 tablet (25 mg total) by mouth daily.       06/23/2023    2:27 PM 02/16/2023    2:12 PM 11/17/2022    2:23 PM 10/15/2022    2:17 PM  GAD 7 : Generalized Anxiety Score  Nervous, Anxious, on Edge 0 0 0 0  Control/stop worrying 0 0 0 0  Worry too much - different things 0 0 0 0  Trouble relaxing 0 0 1 0  Restless 0 0 0 0  Easily annoyed or irritable 0 0 0 0  Afraid - awful might happen 0 0 0 0  Total GAD 7 Score 0 0 1 0  Anxiety Difficulty Not difficult at  all Not difficult at all Not difficult at all Not difficult at all       06/23/2023    2:26 PM 06/03/2023    1:08 PM 02/16/2023    2:11 PM  Depression screen PHQ 2/9  Decreased Interest 1 0 1  Down, Depressed, Hopeless 1 0 1  PHQ - 2 Score 2 0 2  Altered sleeping 1 0 0  Tired, decreased energy 0 0 0  Change in appetite 0 0 0  Feeling bad or failure about yourself  0 0 0  Trouble concentrating 0 0 0  Moving slowly or fidgety/restless 0 0 0  Suicidal thoughts 0 0 0  PHQ-9 Score 3 0 2  Difficult doing work/chores Not difficult at all Not difficult at all Not difficult at all    BP Readings from Last 3 Encounters:  06/23/23 122/68  02/16/23 124/64  02/03/23 126/69    Physical Exam Vitals and nursing note reviewed.  Constitutional:      General: She is not in acute distress.    Appearance: Normal appearance. She is well-developed.  HENT:     Head: Normocephalic and atraumatic.     Nose: Nasal tenderness (and superficial ulceration on right) present.  Cardiovascular:     Rate and Rhythm: Normal rate and regular rhythm.     Pulses: Normal pulses.   Pulmonary:     Effort: Pulmonary effort is normal. No respiratory distress.     Breath sounds: No wheezing or rhonchi.  Abdominal:     General: Abdomen is flat.     Palpations: Abdomen is soft.  Musculoskeletal:     Cervical back: Normal range of motion.     Right lower leg: No edema.     Left lower leg: No edema.  Skin:    General: Skin is warm and dry.     Findings: No rash.  Neurological:     General: No focal deficit present.     Mental Status: She is alert and oriented to person, place, and time.  Psychiatric:        Mood and Affect: Mood normal.        Behavior: Behavior normal.     Wt Readings from Last 3 Encounters:  06/23/23 129 lb (58.5 kg)  06/03/23 129 lb (58.5 kg)  02/16/23 129 lb (58.5 kg)    BP 122/68   Pulse 68   Ht 5\' 7"  (1.702 m)   Wt 129 lb (58.5 kg)   SpO2 97%   BMI 20.20 kg/m   Assessment and Plan:  Problem List Items Addressed This Visit       Unprioritized   Prediabetes (Chronic)    Glucoses managed with diet alone. Lab Results  Component Value Date   HGBA1C 6.1 (H) 04/24/2022  Recently completed an in-home urine collection that sounds like it may have been a urine protein sent by her insurance company. She received a letter saying everything was normal.       Relevant Orders   Comprehensive metabolic panel   Hemoglobin A1c   Mixed hyperlipidemia (Chronic)    LDL is  Lab Results  Component Value Date   LDLCALC 78 04/24/2022   Currently being treated with atorvastatin 10 mg with good compliance and no concerns.       Relevant Orders   Lipid panel   Essential (primary) hypertension - Primary (Chronic)    Normal exam with stable BP on irbesartan and metoprolol. No concerns or side effects to current medication.  Monitoring renal insuff regularly No change in regimen; continue low sodium diet.       Relevant Orders   CBC with Differential/Platelet   Other Visit Diagnoses     Epistaxis       recommend saline nasal  spray daily follow up if worsening       Return in about 6 months (around 12/24/2023) for CPX.    Reubin Milan, MD [facility] [place]

## 2023-06-23 NOTE — Patient Instructions (Signed)
Saline nasal spray - use this several times a day to keep the nasal passages moist.

## 2023-06-23 NOTE — Assessment & Plan Note (Signed)
Normal exam with stable BP on irbesartan and metoprolol. No concerns or side effects to current medication. Monitoring renal insuff regularly No change in regimen; continue low sodium diet.

## 2023-06-23 NOTE — Assessment & Plan Note (Signed)
LDL is  Lab Results  Component Value Date   LDLCALC 78 04/24/2022   Currently being treated with atorvastatin 10 mg with good compliance and no concerns.

## 2023-08-06 ENCOUNTER — Ambulatory Visit: Payer: 59 | Attending: Cardiology | Admitting: Cardiology

## 2023-08-06 ENCOUNTER — Encounter: Payer: Self-pay | Admitting: Nurse Practitioner

## 2023-08-06 ENCOUNTER — Ambulatory Visit: Payer: 59 | Admitting: Cardiology

## 2023-08-06 VITALS — BP 134/80 | HR 74 | Ht 67.75 in | Wt 129.2 lb

## 2023-08-06 DIAGNOSIS — E782 Mixed hyperlipidemia: Secondary | ICD-10-CM

## 2023-08-06 DIAGNOSIS — I10 Essential (primary) hypertension: Secondary | ICD-10-CM

## 2023-08-06 DIAGNOSIS — N1831 Chronic kidney disease, stage 3a: Secondary | ICD-10-CM | POA: Diagnosis not present

## 2023-08-06 DIAGNOSIS — I471 Supraventricular tachycardia, unspecified: Secondary | ICD-10-CM

## 2023-08-06 NOTE — Patient Instructions (Signed)
Medication Instructions:  No changes *If you need a refill on your cardiac medications before your next appointment, please call your pharmacy*   Lab Work: None ordered If you have labs (blood work) drawn today and your tests are completely normal, you will receive your results only by: MyChart Message (if you have MyChart) OR A paper copy in the mail If you have any lab test that is abnormal or we need to change your treatment, we will call you to review the results.   Testing/Procedures: None ordered   Follow-Up: At [facility], you and your health needs are our priority.  As part of our continuing mission to provide you with exceptional heart care, we have created designated Provider Care Teams.  These Care Teams include your primary Cardiologist (physician) and Advanced Practice Providers (APPs -  Physician Assistants and Nurse Practitioners) who all work together to provide you with the care you need, when you need it.  We recommend signing up for the patient portal called "MyChart".  Sign up information is provided on this After Visit Summary.  MyChart is used to connect with patients for Virtual Visits (Telemedicine).  Patients are able to view lab/test results, encounter notes, upcoming appointments, etc.  Non-urgent messages can be sent to your provider as well.   To learn more about what you can do with MyChart, go to [email or web address].    Your next appointment:   12 month(s)  Provider:   You may see Debbe Odea, MD or one of the following Advanced Practice Providers on your designated Care Team:   Nicolasa Ducking, NP Eula Listen, PA-C Cadence Fransico Michael, PA-C Charlsie Quest, NP

## 2023-08-06 NOTE — Progress Notes (Signed)
Cardiology Office Note    Date:  08/06/2023  ID:  Brandy Diaz [birth year], MRN [id number] PCP:  Reubin Milan, MD  Cardiologist:  Debbe Odea, MD  Electrophysiologist:  None   Chief Complaint: Follow-up for palpitations  History of Present Illness: [name] Kitchen    Brandy Diaz is a 77 y.o. female with visit-pertinent history of pretension, GERD, venous stasis, CKD stage IIIa, anxiety.    Evaluated on 12/05/2022 by Dr. Azucena Cecil tachycardia and palpitations.  Her blood pressure was elevated at visit at 174/94 however patient reported her blood pressure was typically well-controlled at home.  She wore a cardiac monitor that showed average heart rate of 81 bpm ranging from 52 bpm to 207 bpm.  Predominant underlying symptom was sinus rhythm.  She had 1 run of V. tach lasting 4 beats with a max rate of 207 bpm.  She had 21 runs of SVT longest lasting 10.7 seconds with a max rate of 182 bpm. Toprol 25 mg daily was added.  Her echocardiogram showed an EF of 55 to 60%, G1 DD, mild MR, mild to moderate TR and mild AR.    She was last seen in office on 02/03/2023 for follow-up.  She had remained stable from a cardiac perspective.  Today she presents for follow-up.  She reports that she is doing very well overall.  She reports that her heart rate has been very well-controlled, she denies any further palpitations.  She denies chest pain or shortness of breath.  Reports that she mows her lawn with a push mower regularly and tolerates this very well.  Her blood pressure today in office slightly elevated with initial being at 142/80 on recheck was 134/80.  She does monitor her blood pressure at home which is consistently in the 120s/60s.  Labwork independently reviewed: 06/23/2023: Potassium 4.5, sodium 138, creatinine 1.27, AST 20, ALT 17 Total cholesterol 182, HDL 90, triglycerides 57 and LDL 81 Hgb 12, HCT 36.7  ROS: .   Today she denies chest pain, shortness of breath, lower extremity  edema, fatigue, palpitations, melena, hematuria, hemoptysis, diaphoresis, weakness, presyncope, syncope, orthopnea, and PND.  All other systems are reviewed and otherwise negative.  Studies Reviewed: [name] Kitchen    EKG:  EKG is ordered today, personally reviewed, demonstrating  EKG Interpretation Date/Time:  Thursday August 06 2023 14:28:00 EDT Ventricular Rate:  74 PR Interval:  168 QRS Duration:  90 QT Interval:  364 QTC Calculation: 404 R Axis:   -20  Text Interpretation: Normal sinus rhythm Moderate voltage criteria for LVH, may be normal variant ( R in aVL , Cornell product ) Confirmed by Reather Littler [id number]) on 08/06/2023 3:09:54 PM    CV Studies:  Cardiac Studies & Procedures       ECHOCARDIOGRAM  ECHOCARDIOGRAM COMPLETE 01/30/2023  Narrative ECHOCARDIOGRAM REPORT    Patient Name:   Brandy Diaz Date of Exam: 01/30/2023 Medical Rec #:  [id number]        Height:       67.0 in Accession #:    [id number]       Weight:       131.2 lb Date of Birth:  1946/04/07        BSA:          1.691 m Patient Age:    77 years         BP:           168/86 mmHg Patient Gender: F  HR:           72 bpm. Exam Location:  [place]  Procedure: 2D Echo, Cardiac Doppler and Color Doppler  Indications:    R06.02 SOB; R60.0 Lower extremity edema  History:        Patient has no prior history of Echocardiogram examinations. Arrythmias:Tachycardia, Signs/Symptoms:Shortness of Breath and Edema; Risk Factors:Hypertension, Dyslipidemia and Non-Smoker.  Sonographer:    Quentin Ore RDMS, RVT, RDCS Referring Phys: [id number] [facility]   Sonographer Comments: Very TDS due to respiratory motion and lack of rib spaces IMPRESSIONS   1. Left ventricular ejection fraction, by estimation, is 55 to 60%. The left ventricle has normal function. The left ventricle has no regional wall motion abnormalities. Left ventricular diastolic parameters are consistent with Grade I  diastolic dysfunction (impaired relaxation). 2. Right ventricular systolic function is normal. The right ventricular size is normal. There is normal pulmonary artery systolic pressure. The estimated right ventricular systolic pressure is 28.4 mmHg. 3. The mitral valve is normal in structure. Mild mitral valve regurgitation. No evidence of mitral stenosis. 4. Tricuspid valve regurgitation is mild to moderate. 5. The aortic valve is tricuspid. Aortic valve regurgitation is mild. No aortic stenosis is present. 6. The inferior vena cava is normal in size with greater than 50% respiratory variability, suggesting right atrial pressure of 3 mmHg.  FINDINGS Left Ventricle: Left ventricular ejection fraction, by estimation, is 55 to 60%. The left ventricle has normal function. The left ventricle has no regional wall motion abnormalities. The left ventricular internal cavity size was normal in size. There is no left ventricular hypertrophy. Left ventricular diastolic parameters are consistent with Grade I diastolic dysfunction (impaired relaxation).  Right Ventricle: The right ventricular size is normal. No increase in right ventricular wall thickness. Right ventricular systolic function is normal. There is normal pulmonary artery systolic pressure. The tricuspid regurgitant velocity is 2.42 m/s, and with an assumed right atrial pressure of 5 mmHg, the estimated right ventricular systolic pressure is 28.4 mmHg.  Left Atrium: Left atrial size was normal in size.  Right Atrium: Right atrial size was normal in size.  Pericardium: There is no evidence of pericardial effusion.  Mitral Valve: The mitral valve is normal in structure. There is mild thickening of the mitral valve leaflet(s). Mild mitral valve regurgitation. No evidence of mitral valve stenosis.  Tricuspid Valve: The tricuspid valve is normal in structure. Tricuspid valve regurgitation is mild to moderate. No evidence of tricuspid  stenosis.  Aortic Valve: The aortic valve is tricuspid. Aortic valve regurgitation is mild. Aortic regurgitation PHT measures 554 msec. No aortic stenosis is present. Aortic valve mean gradient measures 2.0 mmHg. Aortic valve peak gradient measures 3.8 mmHg. Aortic valve area, by VTI measures 2.06 cm.  Pulmonic Valve: The pulmonic valve was normal in structure. Pulmonic valve regurgitation is mild. No evidence of pulmonic stenosis.  Aorta: The aortic root is normal in size and structure.  Venous: The inferior vena cava is normal in size with greater than 50% respiratory variability, suggesting right atrial pressure of 3 mmHg.  IAS/Shunts: No atrial level shunt detected by color flow Doppler.   LEFT VENTRICLE PLAX 2D LVIDd:         4.20 cm     Diastology LVIDs:         2.80 cm     LV e' medial:    6.20 cm/s LV PW:         0.80 cm     LV E/e' medial:  10.4 LV IVS:        0.80 cm     LV e' lateral:   9.14 cm/s LVOT diam:     1.80 cm     LV E/e' lateral: 7.1 LV SV:         43 LV SV Index:   25 LVOT Area:     2.54 cm  LV Volumes (MOD) LV vol d, MOD A2C: 88.4 ml LV vol d, MOD A4C: 93.0 ml LV vol s, MOD A2C: 41.9 ml LV vol s, MOD A4C: 46.8 ml LV SV MOD A2C:     46.5 ml LV SV MOD A4C:     93.0 ml LV SV MOD BP:      46.9 ml  RIGHT VENTRICLE RV S prime:     17.30 cm/s TAPSE (M-mode): 2.5 cm  LEFT ATRIUM             Index        RIGHT ATRIUM           Index LA diam:        2.80 cm 1.66 cm/m   RA Area:     11.60 cm LA Vol (A2C):   38.1 ml 22.54 ml/m  RA Volume:   29.20 ml  17.27 ml/m LA Vol (A4C):   28.1 ml 16.62 ml/m LA Biplane Vol: 32.9 ml 19.46 ml/m AORTIC VALVE                    PULMONIC VALVE AV Area (Vmax):    2.28 cm     PV Vmax:       1.07 m/s AV Area (Vmean):   2.11 cm     PV Peak grad:  4.6 mmHg AV Area (VTI):     2.06 cm AV Vmax:           97.55 cm/s AV Vmean:          66.250 cm/s AV VTI:            0.209 m AV Peak Grad:      3.8 mmHg AV Mean Grad:       2.0 mmHg LVOT Vmax:         87.30 cm/s LVOT Vmean:        54.950 cm/s LVOT VTI:          0.169 m LVOT/AV VTI ratio: 0.81 AI PHT:            554 msec  AORTA Ao Root diam: 2.70 cm Ao Arch diam: 2.6 cm  MITRAL VALVE               TRICUSPID VALVE MV Area (PHT): 2.99 cm    TR Peak grad:   23.4 mmHg MV Decel Time: 254 msec    TR Vmax:        242.00 cm/s MV E velocity: 64.50 cm/s MV A velocity: 91.70 cm/s  SHUNTS MV E/A ratio:  0.70        Systemic VTI:  0.17 m Systemic Diam: 1.80 cm  Julien Nordmann MD Electronically signed by Julien Nordmann MD Signature Date/Time: 01/31/2023/5:27:56 PM    Final    MONITORS  LONG TERM MONITOR (3-14 DAYS) 12/31/2022  Narrative Patch Wear Time:  12 days and 22 hours (2024-01-30T16:32:01-499 to 2024-02-12T14:34:38-0500)  Patient had a min HR of 52 bpm, max HR of 207 bpm, and avg HR of 81 bpm. Predominant underlying rhythm was Sinus Rhythm. 1 run of Ventricular Tachycardia occurred lasting  4 beats with a max rate of 207 bpm (avg 197 bpm). 21 Supraventricular Tachycardia runs occurred, the run with the fastest interval lasting 10.7 secs with a max rate of 182 bpm, the longest lasting 16.5 secs with an avg rate of 106 bpm. Isolated SVEs were rare (<1.0%), SVE Couplets were rare (<1.0%), and SVE Triplets were rare (<1.0%). Isolated VEs were rare (<1.0%), VE Couplets were rare (<1.0%), and no VE Triplets were present. Ventricular Bigeminy and Trigeminy were present.  Conclusion Paroxysmal SVT noted No other significant or sustained arrhythmias.             Current Reported Medications:.    Current Meds  Medication Sig   Accu-Chek Softclix Lancets lancets TEST TWICE DAILY   alendronate (FOSAMAX) 70 MG tablet TAKE 1 TABLET BY MOUTH WEEKLY  WITH 8 OZ OF PLAIN WATER 30  MINUTES BEFORE FIRST FOOD, DRINK OR MEDS. STAY UPRIGHT FOR 30  MINS   aspirin 81 MG chewable tablet Chew by mouth daily.   atorvastatin (LIPITOR) 10 MG tablet TAKE 1 TABLET BY MOUTH  DAILY   blood glucose meter kit and supplies Dispense based on patient and insurance preference. Use up to four times daily as needed. (FOR ICD-10 E11.9).   Calcium Carb-Cholecalciferol (CALCIUM 500 + D) 500-125 MG-UNIT TABS Take 2 tablets by mouth daily. Calcium 600   fluocinonide ointment (LIDEX) 0.05 % Apply 1 application topically 2 (two) times daily.   glucose blood (ACCU-CHEK GUIDE) test strip USE TO TEST TWICE DAILY   irbesartan (AVAPRO) 75 MG tablet TAKE 1 TABLET BY MOUTH DAILY   metoprolol succinate (TOPROL XL) 25 MG 24 hr tablet Take 1 tablet (25 mg total) by mouth daily.    Physical Exam:    VS:  BP 134/80   Pulse 74   Ht 5' 7.75" (1.721 m)   Wt 129 lb 3.2 oz (58.6 kg)   SpO2 99%   BMI 19.79 kg/m    Wt Readings from Last 3 Encounters:  08/06/23 129 lb 3.2 oz (58.6 kg)  06/23/23 129 lb (58.5 kg)  06/03/23 129 lb (58.5 kg)    GEN: Well nourished, well developed in no acute distress NECK: No JVD; No carotid bruits CARDIAC: RRR, no murmurs, rubs, gallops RESPIRATORY:  Clear to auscultation without rales, wheezing or rhonchi  ABDOMEN: Soft, non-tender, non-distended EXTREMITIES:  No edema; No acute deformity     Asessement and Plan:.    Tachycardia/PSVT: Zio monitor demonstrated 21 runs of PSVT, longest lasting 10.7 seconds with a max rate of 182 bpm. EKG today shows sinus rhythm at 74 bpm.  She denies any further palpitations or feeling of increased heart rate.  Continue Toprol 25 mg daily.  HTN: Initial blood pressure today 142/80 on recheck was 134/80.  She reports home blood pressure readings consistently in the 120s/60s. Continue Irbesartan and metoprolol.   HLD: Last lipid profile on 06/23/2023 showed a total cholesterol of 182, HDL 90, triglycerides 57 and LDL 81.  On Lipitor.  Managed by PCP.  CKD stage IIIa: Last creatinine 1.27 on 06/23/2023.  Monitored by PCP.    Disposition: F/u with Dr. Azucena Cecil or Ward Givens, NP in one year.   Signed, Rip Harbour,  NP

## 2023-09-08 ENCOUNTER — Other Ambulatory Visit: Payer: Self-pay | Admitting: Internal Medicine

## 2023-09-08 DIAGNOSIS — Z1231 Encounter for screening mammogram for malignant neoplasm of breast: Secondary | ICD-10-CM

## 2023-09-12 ENCOUNTER — Other Ambulatory Visit: Payer: Self-pay | Admitting: Internal Medicine

## 2023-09-12 DIAGNOSIS — I10 Essential (primary) hypertension: Secondary | ICD-10-CM

## 2023-09-14 NOTE — Telephone Encounter (Signed)
Requested Prescriptions  Pending Prescriptions Disp Refills   irbesartan (AVAPRO) 75 MG tablet [Pharmacy Med Name: IRBESARTAN  75MG   TAB] 100 tablet 1    Sig: TAKE 1 TABLET BY MOUTH DAILY     Cardiovascular:  Angiotensin Receptor Blockers Failed - 09/12/2023 10:48 PM      Failed - Cr in normal range and within 180 days    Creatinine  Date Value Ref Range Status  02/22/2012 1.11 0.60 - 1.30 mg/dL Final   Creatinine, Ser  Date Value Ref Range Status  06/23/2023 1.27 (H) 0.57 - 1.00 mg/dL Final         Passed - K in normal range and within 180 days    Potassium  Date Value Ref Range Status  06/23/2023 4.5 3.5 - 5.2 mmol/L Final  02/22/2012 4.0 3.5 - 5.1 mmol/L Final         Passed - Patient is not pregnant      Passed - Last BP in normal range    BP Readings from Last 1 Encounters:  08/06/23 134/80         Passed - Valid encounter within last 6 months    Recent Outpatient Visits           2 months ago Essential (primary) hypertension   [place] [facility] at [facility], Nyoka Cowden, MD   7 months ago Essential (primary) hypertension   [place] [facility] at [facility], Nyoka Cowden, MD   10 months ago Essential (primary) hypertension   [place] [facility] at [facility], Nyoka Cowden, MD   11 months ago Tachycardia   [facility] at [facility], Nyoka Cowden, MD   1 year ago Hemorrhagic cystitis   [facility] at [facility], Ocie Bob, MD

## 2023-10-05 ENCOUNTER — Ambulatory Visit
Admission: RE | Admit: 2023-10-05 | Discharge: 2023-10-05 | Disposition: A | Discharge status: Home / Self Care | Payer: 59 | Source: Ambulatory Visit | Attending: Internal Medicine | Admitting: Internal Medicine

## 2023-10-05 DIAGNOSIS — Z1231 Encounter for screening mammogram for malignant neoplasm of breast: Secondary | ICD-10-CM | POA: Insufficient documentation

## 2023-12-17 ENCOUNTER — Other Ambulatory Visit: Payer: Self-pay | Admitting: Internal Medicine

## 2023-12-17 DIAGNOSIS — R7303 Prediabetes: Secondary | ICD-10-CM

## 2023-12-18 NOTE — Telephone Encounter (Signed)
 Requested medication (s) are due for refill today: yes  Requested medication (s) are on the active medication list: yes  Last refill:  03/30/23 #200 2 RF  Future visit scheduled: no  Notes to clinic:  med will not attach to a protocol   Requested Prescriptions  Pending Prescriptions Disp Refills   ACCU-CHEK GUIDE TEST test strip [Pharmacy Med Name: Accu-Chek Guide In Vitro Strip] 200 strip 2    Sig: TEST TWICE DAILY     There is no refill protocol information for this order

## 2023-12-28 ENCOUNTER — Other Ambulatory Visit: Payer: Self-pay | Admitting: Internal Medicine

## 2023-12-28 DIAGNOSIS — E1122 Type 2 diabetes mellitus with diabetic chronic kidney disease: Secondary | ICD-10-CM

## 2023-12-29 ENCOUNTER — Other Ambulatory Visit: Payer: Self-pay | Admitting: Oncology

## 2023-12-29 NOTE — Telephone Encounter (Signed)
 Requested Prescriptions  Pending Prescriptions Disp Refills   Accu-Chek Softclix Lancets lancets [Pharmacy Med Name: Accu-Chek Softclix Lancets] 200 each 2    Sig: TEST TWICE DAILY     Endocrinology: Diabetes - Testing Supplies Passed - 12/29/2023  3:23 PM      Passed - Valid encounter within last 12 months    Recent Outpatient Visits           6 months ago Essential (primary) hypertension   [place] [facility] at [facility], Nyoka Cowden, MD   10 months ago Essential (primary) hypertension   [place] [facility] at [facility], Nyoka Cowden, MD   1 year ago Essential (primary) hypertension   [place] [facility] at [facility], Nyoka Cowden, MD   1 year ago Tachycardia   [facility] at [facility], Nyoka Cowden, MD   1 year ago Hemorrhagic cystitis   [facility] at [facility], Ocie Bob, MD

## 2023-12-30 ENCOUNTER — Other Ambulatory Visit: Payer: Self-pay

## 2023-12-30 MED ORDER — ALENDRONATE SODIUM 70 MG PO TABS: 70.0000 mg | ORAL_TABLET | ORAL | 3 refills | Status: AC

## 2024-02-18 ENCOUNTER — Other Ambulatory Visit: Payer: Self-pay | Admitting: Internal Medicine

## 2024-02-18 DIAGNOSIS — E782 Mixed hyperlipidemia: Secondary | ICD-10-CM

## 2024-02-18 DIAGNOSIS — I10 Essential (primary) hypertension: Secondary | ICD-10-CM

## 2024-02-19 NOTE — Telephone Encounter (Signed)
 Requested medication (s) are due for refill today: no  Requested medication (s) are on the active medication list: yes  Last refill:  09/14/23 #100 1 RF  Future visit scheduled: yes  Notes to clinic:  Cr outside normal range- sent back per protocol   Requested Prescriptions  Pending Prescriptions Disp Refills   irbesartan (AVAPRO) 75 MG tablet [Pharmacy Med Name: IRBESARTAN  75MG   TAB] 100 tablet     Sig: TAKE 1 TABLET BY MOUTH DAILY     Cardiovascular:  Angiotensin Receptor Blockers Failed - 02/19/2024  1:58 PM      Failed - Cr in normal range and within 180 days    Creatinine  Date Value Ref Range Status  02/22/2012 1.11 0.60 - 1.30 mg/dL Final   Creatinine, Ser  Date Value Ref Range Status  06/23/2023 1.27 (H) 0.57 - 1.00 mg/dL Final         Failed - K in normal range and within 180 days    Potassium  Date Value Ref Range Status  06/23/2023 4.5 3.5 - 5.2 mmol/L Final  02/22/2012 4.0 3.5 - 5.1 mmol/L Final         Failed - Valid encounter within last 6 months    Recent Outpatient Visits   None            Passed - Patient is not pregnant      Passed - Last BP in normal range    BP Readings from Last 1 Encounters:  08/06/23 134/80         Signed Prescriptions Disp Refills   atorvastatin (LIPITOR) 10 MG tablet 100 tablet 0    Sig: TAKE 1 TABLET BY MOUTH DAILY     Cardiovascular:  Antilipid - Statins Failed - 02/19/2024  1:58 PM      Failed - Valid encounter within last 12 months    Recent Outpatient Visits   None            Failed - Lipid Panel in normal range within the last 12 months    Cholesterol, Total  Date Value Ref Range Status  06/23/2023 182 100 - 199 mg/dL Final   Cholesterol  Date Value Ref Range Status  02/14/2012 218 (H) 0 - 200 mg/dL Final   Ldl Cholesterol, Calc  Date Value Ref Range Status  02/14/2012 118 (H) 0 - 100 mg/dL Final   LDL Chol Calc (NIH)  Date Value Ref Range Status  06/23/2023 81 0 - 99 mg/dL Final   HDL  Cholesterol  Date Value Ref Range Status  02/14/2012 87 (H) 40 - 60 mg/dL Final   HDL  Date Value Ref Range Status  06/23/2023 90 >39 mg/dL Final   Triglycerides  Date Value Ref Range Status  06/23/2023 57 0 - 149 mg/dL Final  16/08/9603 64 0 - 200 mg/dL Final         Passed - Patient is not pregnant

## 2024-02-19 NOTE — Telephone Encounter (Signed)
 Requested Prescriptions  Pending Prescriptions Disp Refills   irbesartan (AVAPRO) 75 MG tablet [Pharmacy Med Name: IRBESARTAN  75MG   TAB] 100 tablet     Sig: TAKE 1 TABLET BY MOUTH DAILY     Cardiovascular:  Angiotensin Receptor Blockers Failed - 02/19/2024  1:58 PM      Failed - Cr in normal range and within 180 days    Creatinine  Date Value Ref Range Status  02/22/2012 1.11 0.60 - 1.30 mg/dL Final   Creatinine, Ser  Date Value Ref Range Status  06/23/2023 1.27 (H) 0.57 - 1.00 mg/dL Final         Failed - K in normal range and within 180 days    Potassium  Date Value Ref Range Status  06/23/2023 4.5 3.5 - 5.2 mmol/L Final  02/22/2012 4.0 3.5 - 5.1 mmol/L Final         Failed - Valid encounter within last 6 months    Recent Outpatient Visits   None            Passed - Patient is not pregnant      Passed - Last BP in normal range    BP Readings from Last 1 Encounters:  08/06/23 134/80          atorvastatin (LIPITOR) 10 MG tablet [Pharmacy Med Name: Atorvastatin Calcium 10 MG Oral Tablet] 100 tablet 0    Sig: TAKE 1 TABLET BY MOUTH DAILY     Cardiovascular:  Antilipid - Statins Failed - 02/19/2024  1:58 PM      Failed - Valid encounter within last 12 months    Recent Outpatient Visits   None            Failed - Lipid Panel in normal range within the last 12 months    Cholesterol, Total  Date Value Ref Range Status  06/23/2023 182 100 - 199 mg/dL Final   Cholesterol  Date Value Ref Range Status  02/14/2012 218 (H) 0 - 200 mg/dL Final   Ldl Cholesterol, Calc  Date Value Ref Range Status  02/14/2012 118 (H) 0 - 100 mg/dL Final   LDL Chol Calc (NIH)  Date Value Ref Range Status  06/23/2023 81 0 - 99 mg/dL Final   HDL Cholesterol  Date Value Ref Range Status  02/14/2012 87 (H) 40 - 60 mg/dL Final   HDL  Date Value Ref Range Status  06/23/2023 90 >39 mg/dL Final   Triglycerides  Date Value Ref Range Status  06/23/2023 57 0 - 149 mg/dL Final   16/08/9603 64 0 - 200 mg/dL Final         Passed - Patient is not pregnant

## 2024-02-22 ENCOUNTER — Encounter: Payer: Self-pay | Admitting: Internal Medicine

## 2024-02-22 ENCOUNTER — Ambulatory Visit (INDEPENDENT_AMBULATORY_CARE_PROVIDER_SITE_OTHER): Admitting: Internal Medicine

## 2024-02-22 VITALS — BP 122/74 | HR 81 | Ht 67.75 in | Wt 123.5 lb

## 2024-02-22 DIAGNOSIS — N1831 Chronic kidney disease, stage 3a: Secondary | ICD-10-CM | POA: Diagnosis not present

## 2024-02-22 DIAGNOSIS — R072 Precordial pain: Secondary | ICD-10-CM | POA: Diagnosis not present

## 2024-02-22 DIAGNOSIS — R7303 Prediabetes: Secondary | ICD-10-CM

## 2024-02-22 DIAGNOSIS — I10 Essential (primary) hypertension: Secondary | ICD-10-CM

## 2024-02-22 DIAGNOSIS — E782 Mixed hyperlipidemia: Secondary | ICD-10-CM | POA: Diagnosis not present

## 2024-02-22 MED ORDER — IRBESARTAN 75 MG PO TABS: 75.0000 mg | ORAL_TABLET | Freq: Every day | ORAL | 1 refills | Status: DC

## 2024-02-22 NOTE — Assessment & Plan Note (Signed)
 Monitoring regularly. Last GFR 43.

## 2024-02-22 NOTE — Assessment & Plan Note (Signed)
 Blood pressure is well controlled.  Current medications irbesartan and metoprolol. Will continue same regimen.

## 2024-02-22 NOTE — Progress Notes (Signed)
 Date:  02/22/2024   Name:  Brandy Diaz   DOB:  1945/12/31   MRN:  [id number]   Chief Complaint: Hypertension and Hyperlipidemia  Hypertension This is a chronic problem. The problem is controlled. Associated symptoms include chest pain (more like shortness of breath). Pertinent negatives include no headaches, palpitations or shortness of breath. Past treatments include angiotensin blockers and beta blockers. The current treatment provides significant improvement. Hypertensive end-organ damage includes kidney disease. There is no history of CAD/MI or CVA.  Hyperlipidemia This is a chronic problem. The problem is controlled. Associated symptoms include chest pain (more like shortness of breath). Pertinent negatives include no myalgias or shortness of breath. Current antihyperlipidemic treatment includes statins.  Diabetes She presents for her follow-up diabetic visit. Diabetes type: prediabetes. Her disease course has been stable. Pertinent negatives for hypoglycemia include no dizziness, headaches or nervousness/anxiousness. Associated symptoms include chest pain (more like shortness of breath). Pertinent negatives for diabetes include no fatigue and no weakness. Pertinent negatives for diabetic complications include no CVA. Current diabetic treatment includes diet.  Chest Pain  This is a new problem. The problem occurs intermittently. The problem has been unchanged. The pain is present in the substernal region. The pain is mild. The quality of the pain is described as pressure. The pain does not radiate. Pertinent negatives include no abdominal pain, cough, dizziness, headaches, palpitations, shortness of breath or weakness. Associated with: activity. She has tried rest for the symptoms.  Her past medical history is significant for hyperlipidemia and hypertension.    Review of Systems  Constitutional:  Positive for appetite change and unexpected weight change. Negative for chills and  fatigue.  HENT:  Negative for trouble swallowing.   Eyes:  Negative for visual disturbance.  Respiratory:  Negative for cough, chest tightness, shortness of breath and wheezing.   Cardiovascular:  Positive for chest pain (more like shortness of breath). Negative for palpitations and leg swelling.  Gastrointestinal:  Negative for abdominal pain, constipation and diarrhea.  Genitourinary:  Negative for dysuria.  Musculoskeletal:  Negative for arthralgias and myalgias.  Neurological:  Negative for dizziness, weakness, light-headedness and headaches.  Psychiatric/Behavioral:  Negative for dysphoric mood and sleep disturbance. The patient is not nervous/anxious.      Lab Results  Component Value Date   NA 138 06/23/2023   K 4.5 06/23/2023   CO2 25 06/23/2023   GLUCOSE 160 (H) 06/23/2023   BUN 20 06/23/2023   CREATININE 1.27 (H) 06/23/2023   CALCIUM 10.2 06/23/2023   EGFR 43 (L) 06/23/2023   GFRNONAA 46 (L) 08/17/2020   Lab Results  Component Value Date   CHOL 182 06/23/2023   HDL 90 06/23/2023   LDLCALC 81 06/23/2023   TRIG 57 06/23/2023   CHOLHDL 2.0 06/23/2023   Lab Results  Component Value Date   TSH 1.150 10/15/2022   Lab Results  Component Value Date   HGBA1C 6.3 (H) 06/23/2023   Lab Results  Component Value Date   WBC 5.8 06/23/2023   HGB 12.0 06/23/2023   HCT 36.7 06/23/2023   MCV 92 06/23/2023   PLT 274 06/23/2023   Lab Results  Component Value Date   ALT 17 06/23/2023   AST 20 06/23/2023   ALKPHOS 82 06/23/2023   BILITOT 0.3 06/23/2023   Lab Results  Component Value Date   VD25OH 40.97 05/23/2022     Patient Active Problem List   Diagnosis Date Noted   Tachycardia 10/15/2022   Hemorrhagic cystitis 06/06/2022  Osteoporosis 04/24/2022   Chronic idiopathic constipation 10/22/2021   Venous stasis dermatitis of both lower extremities 11/08/2019   Ductal carcinoma in situ (DCIS) of left breast 05/21/2017   Localized edema 04/28/2017   Stage 3a  chronic kidney disease (HCC) 01/01/2016   Essential (primary) hypertension 07/24/2015   Gastro-esophageal reflux disease without esophagitis 07/24/2015   Mixed hyperlipidemia 07/24/2015   Prediabetes 07/24/2015   Fibrocystic breast changes 07/24/2015   Anxiety 05/24/2012    Allergies  Allergen Reactions   Ace Inhibitors Other (See Comments)    Cough   Hydrochlorothiazide Other (See Comments)    Dizziness   Triamterene-Hctz Other (See Comments)    Appetite loss    Past Surgical History:  Procedure Laterality Date   ABDOMINAL HYSTERECTOMY  1980   BREAST BIOPSY Left 2018   DCIS   BREAST LUMPECTOMY Left 03/2017   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 WITH    BREAST LUMPECTOMY WITH NEEDLE LOCALIZATION Left 04/07/2017   Procedure: BREAST LUMPECTOMY WITH NEEDLE LOCALIZATION;  Surgeon: Ancil Linsey, MD;  Location: [facility];  Service: General;  Laterality: Left;   BREAST SURGERY Left    Breast Biopsy   CATARACT EXTRACTION W/PHACO Right 06/12/2016   Procedure: CATARACT EXTRACTION PHACO AND INTRAOCULAR LENS PLACEMENT (IOC);  Surgeon: Nevada Crane, MD;  Location: [facility];  Service: Ophthalmology;  Laterality: Right;  [place] 6.1 AP% 12.7 CDE .77 FLUID PACK LOT # [id number]   CATARACT EXTRACTION W/PHACO Left 04/02/2021   Procedure: CATARACT EXTRACTION PHACO AND INTRAOCULAR LENS PLACEMENT (IOC) LEFT MILOOP DIABETIC;  Surgeon: Galen Manila, MD;  Location[facility];  Service: Ophthalmology;  Laterality: Left;  Diabetic - diet controlled 52.81 03:47.0   COLONOSCOPY  08/02/2012   Dr. Ricki Rodriguez - diverticuli otherwise normal   ESOPHAGOGASTRODUODENOSCOPY  08/02/2012   chronic gastritis   EYE SURGERY Right    Cataract Extraction with IOL   OOPHORECTOMY      Social History   Tobacco Use   Smoking status: Never    Passive exposure: Past   Smokeless tobacco: Never   Tobacco comments:    smoking cessation materials not required  Vaping Use    Vaping status: Never Used  Substance Use Topics   Alcohol use: No    Alcohol/week: 0.0 standard drinks of alcohol   Drug use: No     Medication list has been reviewed and updated.  Current Meds  Medication Sig   ACCU-CHEK GUIDE TEST test strip TEST TWICE DAILY   Accu-Chek Softclix Lancets lancets TEST TWICE DAILY   alendronate (FOSAMAX) 70 MG tablet Take 1 tablet (70 mg total) by mouth once a week. Take with a full glass of water on an empty stomach.   aspirin 81 MG chewable tablet Chew by mouth daily.   atorvastatin (LIPITOR) 10 MG tablet TAKE 1 TABLET BY MOUTH DAILY   blood glucose meter kit and supplies Dispense based on patient and insurance preference. Use up to four times daily as needed. (FOR ICD-10 E11.9).   Calcium Carb-Cholecalciferol (CALCIUM 500 + D) 500-125 MG-UNIT TABS Take 2 tablets by mouth daily. Calcium 600   metoprolol succinate (TOPROL XL) 25 MG 24 hr tablet Take 1 tablet (25 mg total) by mouth daily.   [DISCONTINUED] irbesartan (AVAPRO) 75 MG tablet TAKE 1 TABLET BY MOUTH DAILY       02/22/2024   10:52 AM 06/23/2023    2:27 PM 02/16/2023    2:12 PM 11/17/2022    2:23 PM  GAD 7 : Generalized Anxiety Score  Nervous, Anxious, on Edge 0 0 0 0  Control/stop worrying 0 0 0 0  Worry too much - different things 0 0 0 0  Trouble relaxing 0 0 0 1  Restless 0 0 0 0  Easily annoyed or irritable 0 0 0 0  Afraid - awful might happen 0 0 0 0  Total GAD 7 Score 0 0 0 1  Anxiety Difficulty Not difficult at all Not difficult at all Not difficult at all Not difficult at all       02/22/2024   10:49 AM 06/23/2023    2:26 PM 06/03/2023    1:08 PM  Depression screen PHQ 2/9  Decreased Interest 3 1 0  Down, Depressed, Hopeless 1 1 0  PHQ - 2 Score 4 2 0  Altered sleeping 0 1 0  Tired, decreased energy 1 0 0  Change in appetite 0 0 0  Feeling bad or failure about yourself  0 0 0  Trouble concentrating 0 0 0  Moving slowly or fidgety/restless 0 0 0  Suicidal thoughts  0 0   PHQ-9 Score 5 3 0  Difficult doing work/chores Not difficult at all Not difficult at all Not difficult at all    BP Readings from Last 3 Encounters:  02/22/24 122/74  08/06/23 134/80  06/23/23 122/68    Physical Exam Vitals and nursing note reviewed.  Constitutional:      General: She is not in acute distress.    Appearance: Normal appearance. She is well-developed.  HENT:     Head: Normocephalic and atraumatic.  Neck:     Vascular: No carotid bruit.  Cardiovascular:     Rate and Rhythm: Normal rate and regular rhythm.     Heart sounds: No murmur heard. Pulmonary:     Effort: Pulmonary effort is normal. No respiratory distress.     Breath sounds: No wheezing or rhonchi.  Musculoskeletal:     Cervical back: Normal range of motion.     Right lower leg: Edema present.     Left lower leg: Edema present.  Lymphadenopathy:     Cervical: No cervical adenopathy.  Skin:    General: Skin is warm and dry.     Findings: No rash.  Neurological:     General: No focal deficit present.     Mental Status: She is alert and oriented to person, place, and time.  Psychiatric:        Mood and Affect: Mood normal.        Behavior: Behavior normal.     Wt Readings from Last 3 Encounters:  02/22/24 123 lb 8 oz (56 kg)  08/06/23 129 lb 3.2 oz (58.6 kg)  06/23/23 129 lb (58.5 kg)    BP 122/74   Pulse 81   Ht 5' 7.75" (1.721 m)   Wt 123 lb 8 oz (56 kg)   BMI 18.92 kg/m   Assessment and Plan:  Problem List Items Addressed This Visit       Unprioritized   Essential (primary) hypertension - Primary (Chronic)   Blood pressure is well controlled.  Current medications irbesartan and metoprolol. Will continue same regimen.      Relevant Medications   irbesartan (AVAPRO) 75 MG tablet   Other Relevant Orders   CBC with Differential/Platelet   Comprehensive metabolic panel with GFR   TSH + free T4   Mixed hyperlipidemia (Chronic)   LDL is  Lab Results  Component Value  Date    LDLCALC 81 06/23/2023   Current regimen is atorvastatin.  No medication side effects noted. Goal LDL is <70.       Relevant Medications   irbesartan (AVAPRO) 75 MG tablet   Other Relevant Orders   Lipid panel   Prediabetes (Chronic)   Blood sugars have been stable.  No recent hypoglycemic events requiring assistance. Currently medications are none - diet only. Lab Results  Component Value Date   HGBA1C 6.3 (H) 06/23/2023   Last visit no changes were made. She can stop regular FSBS - test PRN only.       Relevant Orders   Hemoglobin A1c   Stage 3a chronic kidney disease (HCC) (Chronic)   Monitoring regularly. Last GFR 43.      Relevant Orders   Comprehensive metabolic panel with GFR   Other Visit Diagnoses       Precordial pain       EKG unchanged from 07/2023 - NSR continue ASA and statin; no change in BB or ARB recommend follow up with Cardiology - concern for anginal equivalent   Relevant Orders   EKG 12-Lead (Completed)       Return in about 4 months (around 06/23/2024) for DM, HTN.    Sheron Dixons, MD [facility] [place]

## 2024-02-22 NOTE — Assessment & Plan Note (Signed)
 LDL is  Lab Results  Component Value Date   LDLCALC 81 06/23/2023   Current regimen is atorvastatin.  No medication side effects noted. Goal LDL is <70.

## 2024-02-22 NOTE — Patient Instructions (Signed)
 Call the cardiologist and get an appointment in the next month for chest pain/shortness of breath with normal EKG.  You can stop checking your blood sugar every day..  You can check it if you think it is low or high.

## 2024-02-22 NOTE — Assessment & Plan Note (Signed)
 Blood sugars have been stable.  No recent hypoglycemic events requiring assistance. Currently medications are none - diet only. Lab Results  Component Value Date   HGBA1C 6.3 (H) 06/23/2023   Last visit no changes were made. She can stop regular FSBS - test PRN only.

## 2024-02-23 LAB — HEMOGLOBIN A1C
Est. average glucose Bld gHb Est-mCnc: 128 mg/dL
Hgb A1c MFr Bld: 6.1 % — ABNORMAL HIGH (ref 4.8–5.6)

## 2024-02-23 LAB — COMPREHENSIVE METABOLIC PANEL WITH GFR
ALT: 19 IU/L (ref 0–32)
AST: 28 IU/L (ref 0–40)
Albumin: 4.4 g/dL (ref 3.8–4.8)
Alkaline Phosphatase: 80 IU/L (ref 44–121)
BUN/Creatinine Ratio: 11 — ABNORMAL LOW (ref 12–28)
BUN: 14 mg/dL (ref 8–27)
Bilirubin Total: 0.5 mg/dL (ref 0.0–1.2)
CO2: 24 mmol/L (ref 20–29)
Calcium: 10.1 mg/dL (ref 8.7–10.3)
Chloride: 101 mmol/L (ref 96–106)
Creatinine, Ser: 1.27 mg/dL — ABNORMAL HIGH (ref 0.57–1.00)
Globulin, Total: 3.3 g/dL (ref 1.5–4.5)
Glucose: 114 mg/dL — ABNORMAL HIGH (ref 70–99)
Potassium: 4.5 mmol/L (ref 3.5–5.2)
Sodium: 140 mmol/L (ref 134–144)
Total Protein: 7.7 g/dL (ref 6.0–8.5)
eGFR: 43 mL/min/{1.73_m2} — ABNORMAL LOW (ref >59)

## 2024-02-23 LAB — CBC WITH DIFFERENTIAL/PLATELET
Basophils Absolute: 0.1 10*3/uL (ref 0.0–0.2)
Basos: 2 %
EOS (ABSOLUTE): 0.1 10*3/uL (ref 0.0–0.4)
Eos: 3 %
Hematocrit: 38.8 % (ref 34.0–46.6)
Hemoglobin: 12.6 g/dL (ref 11.1–15.9)
Immature Grans (Abs): 0 10*3/uL (ref 0.0–0.1)
Immature Granulocytes: 0 %
Lymphocytes Absolute: 1.3 10*3/uL (ref 0.7–3.1)
Lymphs: 26 %
MCH: 30 pg (ref 26.6–33.0)
MCHC: 32.5 g/dL (ref 31.5–35.7)
MCV: 92 fL (ref 79–97)
Monocytes Absolute: 0.4 10*3/uL (ref 0.1–0.9)
Monocytes: 9 %
Neutrophils Absolute: 2.9 10*3/uL (ref 1.4–7.0)
Neutrophils: 60 %
Platelets: 275 10*3/uL (ref 150–450)
RBC: 4.2 x10E6/uL (ref 3.77–5.28)
RDW: 12.8 % (ref 11.7–15.4)
WBC: 4.8 10*3/uL (ref 3.4–10.8)

## 2024-02-23 LAB — LIPID PANEL
Chol/HDL Ratio: 2 ratio (ref 0.0–4.4)
Cholesterol, Total: 170 mg/dL (ref 100–199)
HDL: 83 mg/dL (ref >39)
LDL Chol Calc (NIH): 74 mg/dL (ref 0–99)
Triglycerides: 65 mg/dL (ref 0–149)
VLDL Cholesterol Cal: 13 mg/dL (ref 5–40)

## 2024-02-23 LAB — TSH+FREE T4
Free T4: 1.13 ng/dL (ref 0.82–1.77)
TSH: 1.32 u[IU]/mL (ref 0.450–4.500)

## 2024-04-08 ENCOUNTER — Other Ambulatory Visit: Payer: Self-pay

## 2024-04-08 MED ORDER — METOPROLOL SUCCINATE ER 25 MG PO TB24: 25.0000 mg | ORAL_TABLET | Freq: Every day | ORAL | 3 refills | Status: AC

## 2024-04-28 ENCOUNTER — Other Ambulatory Visit: Payer: Self-pay | Admitting: Internal Medicine

## 2024-04-28 DIAGNOSIS — E782 Mixed hyperlipidemia: Secondary | ICD-10-CM

## 2024-05-02 NOTE — Telephone Encounter (Signed)
 Requested Prescriptions  Pending Prescriptions Disp Refills   atorvastatin  (LIPITOR) 10 MG tablet [Pharmacy Med Name: Atorvastatin  Calcium  10 MG Oral Tablet] 100 tablet 2    Sig: TAKE 1 TABLET BY MOUTH DAILY     Cardiovascular:  Antilipid - Statins Failed - 05/02/2024  2:41 PM      Failed - Valid encounter within last 12 months    Recent Outpatient Visits           2 months ago Essential (primary) hypertension   [place] [facility], Leita DEL, MD              Failed - Lipid Panel in normal range within the last 12 months    Cholesterol, Total  Date Value Ref Range Status  02/22/2024 170 100 - 199 mg/dL Final   Cholesterol  Date Value Ref Range Status  02/14/2012 218 (H) 0 - 200 mg/dL Final   Ldl Cholesterol, Calc  Date Value Ref Range Status  02/14/2012 118 (H) 0 - 100 mg/dL Final   LDL Chol Calc (NIH)  Date Value Ref Range Status  02/22/2024 74 0 - 99 mg/dL Final   HDL Cholesterol  Date Value Ref Range Status  02/14/2012 87 (H) 40 - 60 mg/dL Final   HDL  Date Value Ref Range Status  02/22/2024 83 >39 mg/dL Final   Triglycerides  Date Value Ref Range Status  02/22/2024 65 0 - 149 mg/dL Final  95/93/7986 64 0 - 200 mg/dL Final         Passed - Patient is not pregnant

## 2024-06-08 ENCOUNTER — Ambulatory Visit: Payer: 59

## 2024-06-15 ENCOUNTER — Ambulatory Visit: Admitting: Emergency Medicine

## 2024-06-15 VITALS — Ht 66.0 in | Wt 125.0 lb

## 2024-06-15 DIAGNOSIS — Z780 Asymptomatic menopausal state: Secondary | ICD-10-CM | POA: Diagnosis not present

## 2024-06-15 DIAGNOSIS — Z0000 Encounter for general adult medical examination without abnormal findings: Secondary | ICD-10-CM | POA: Diagnosis not present

## 2024-06-15 DIAGNOSIS — Z1231 Encounter for screening mammogram for malignant neoplasm of breast: Secondary | ICD-10-CM

## 2024-06-15 NOTE — Patient Instructions (Signed)
 Ms. Ryback , Thank you for taking time out of your busy schedule to complete your Annual Wellness Visit with me. I enjoyed our conversation and look forward to speaking with you again next year. I, as well as your care team,  appreciate your ongoing commitment to your health goals. Please review the following plan we discussed and let me know if I can assist you in the future. Your Game plan/ To Do List    Referrals: None   Follow up Visits: We will see or speak with you next year for your Next Medicare AWV with our clinical staff Have you seen your provider in the last 6 months (3 months if uncontrolled diabetes)? Yes  Clinician Recommendations: Get the covid and flu vaccines in the fall. Get a tetanus shot at your local pharmacy at your convenience. Aim for 30 minutes of exercise or brisk walking, 6-8 glasses of water, and 5 servings of fruits and vegetables each day.   Please call to schedule your mammogram (due11/25/25) and bone density scan: You may have these done in the same visit.  [facility] Address: [address], [place], [place] Phone: [phone]        This is a list of the screenings recommended for you:  Health Maintenance  Topic Date Due   Zoster (Shingles) Vaccine (1 of 2) Never done   DTaP/Tdap/Td vaccine (3 - Td or Tdap) 05/12/2023   DEXA scan (bone density measurement)  10/01/2023   COVID-19 Vaccine (5 - 2024-25 season) 11/05/2023   Flu Shot  06/10/2024   Mammogram  10/04/2024   Medicare Annual Wellness Visit  06/15/2025   Pneumococcal Vaccine for age over 94  Completed   Hepatitis C Screening  Completed   Hepatitis B Vaccine  Aged Out   HPV Vaccine  Aged Out   Meningitis B Vaccine  Aged Out   Colon Cancer Screening  Discontinued    Advanced directives: (Provided) Advance directive discussed with you today. I have provided a copy for you to complete at home and have notarized. Once this is complete,  please bring a copy in to our office so we can scan it into your chart.  Advance Care Planning is important because it:  [x]  Makes sure you receive the medical care that is consistent with your values, goals, and preferences  [x]  It provides guidance to your family and loved ones and reduces their decisional burden about whether or not they are making the right decisions based on your wishes.  Follow the link provided in your after visit summary or read over the paperwork we have mailed to you to help you started getting your Advance Directives in place. If you need assistance in completing these, please reach out to us  so that we can help you!  See attachments for Preventive Care and Fall Prevention Tips.   Fall Prevention in the Home, Adult Falls can cause injuries and affect people of all ages. There are many simple things that you can do to make your home safe and to help prevent falls. If you need it, ask for help making these changes. What actions can I take to prevent falls? General information Use good lighting in all rooms. Make sure to: Replace any light bulbs that burn out. Turn on lights if it is dark and use night-lights. Keep items that you use often in easy-to-reach places. Lower the shelves around your home if needed. Move furniture so that there are  clear paths around it. Do not keep throw rugs or other things on the floor that can make you trip. If any of your floors are uneven, fix them. Add color or contrast paint or tape to clearly mark and help you see: Grab bars or handrails. First and last steps of staircases. Where the edge of each step is. If you use a ladder or stepladder: Make sure that it is fully opened. Do not climb a closed ladder. Make sure the sides of the ladder are locked in place. Have someone hold the ladder while you use it. Know where your pets are as you move through your home. What can I do in the bathroom?     Keep the floor dry. Clean up  any water that is on the floor right away. Remove soap buildup in the bathtub or shower. Buildup makes bathtubs and showers slippery. Use non-skid mats or decals on the floor of the bathtub or shower. Attach bath mats securely with double-sided, non-slip rug tape. If you need to sit down while you are in the shower, use a non-slip stool. Install grab bars by the toilet and in the bathtub and shower. Do not use towel bars as grab bars. What can I do in the bedroom? Make sure that you have a light by your bed that is easy to reach. Do not use any sheets or blankets on your bed that hang to the floor. Have a firm bench or chair with side arms that you can use for support when you get dressed. What can I do in the kitchen? Clean up any spills right away. If you need to reach something above you, use a sturdy step stool that has a grab bar. Keep electrical cables out of the way. Do not use floor polish or wax that makes floors slippery. What can I do with my stairs? Do not leave anything on the stairs. Make sure that you have a light switch at the top and the bottom of the stairs. Have them installed if you do not have them. Make sure that there are handrails on both sides of the stairs. Fix handrails that are broken or loose. Make sure that handrails are as long as the staircases. Install non-slip stair treads on all stairs in your home if they do not have carpet. Avoid having throw rugs at the top or bottom of stairs, or secure the rugs with carpet tape to prevent them from moving. Choose a carpet design that does not hide the edge of steps on the stairs. Make sure that carpet is firmly attached to the stairs. Fix any carpet that is loose or worn. What can I do on the outside of my home? Use bright outdoor lighting. Repair the edges of walkways and driveways and fix any cracks. Clear paths of anything that can make you trip, such as tools or rocks. Add color or contrast paint or tape to clearly  mark and help you see high doorway thresholds. Trim any bushes or trees on the main path into your home. Check that handrails are securely fastened and in good repair. Both sides of all steps should have handrails. Install guardrails along the edges of any raised decks or porches. Have leaves, snow, and ice cleared regularly. Use sand, salt, or ice melt on walkways during winter months if you live where there is ice and snow. In the garage, clean up any spills right away, including grease or oil spills. What other actions can I  take? Review your medicines with your health care provider. Some medicines can make you confused or feel dizzy. This can increase your chance of falling. Wear closed-toe shoes that fit well and support your feet. Wear shoes that have rubber soles and low heels. Use a cane, walker, scooter, or crutches that help you move around if needed. Talk with your provider about other ways that you can decrease your risk of falls. This may include seeing a physical therapist to learn to do exercises to improve movement and strength. Where to find more information Centers for Disease Control and Prevention, STEADI: TonerPromos.no [employer] on Aging: BaseRingTones.pl National Institute on Aging: BaseRingTones.pl Contact a health care provider if: You are afraid of falling at home. You feel weak, drowsy, or dizzy at home. You fall at home. Get help right away if you: Lose consciousness or have trouble moving after a fall. Have a fall that causes a head injury. These symptoms may be an emergency. Get help right away. Call 911. Do not wait to see if the symptoms will go away. Do not drive yourself to the hospital. This information is not intended to replace advice given to you by your health care provider. Make sure you discuss any questions you have with your health care provider. Document Revised: 06/30/2022 Document Reviewed: 06/30/2022 Elsevier Patient Education  2024 [employer].

## 2024-06-15 NOTE — Progress Notes (Signed)
 Subjective:   Brandy Diaz is a 78 y.o. who presents for a Medicare Wellness preventive visit.  As a reminder, Annual Wellness Visits don't include a physical exam, and some assessments may be limited, especially if this visit is performed virtually. We may recommend an in-person follow-up visit with your provider if needed.  Visit Complete: Virtual I connected with  SASCHA PALMA on 06/15/24 by a audio enabled telemedicine application and verified that I am speaking with the correct person using two identifiers.  Patient Location: Home  Provider Location: Home Office  I discussed the limitations of evaluation and management by telemedicine. The patient expressed understanding and agreed to proceed.  Vital Signs: Because this visit was a virtual/telehealth visit, some criteria may be missing or patient reported. Any vitals not documented were not able to be obtained and vitals that have been documented are patient reported.  VideoDeclined- This patient declined Librarian, academic. Therefore the visit was completed with audio only.  Persons Participating in Visit: Patient.  AWV Questionnaire: No: Patient Medicare AWV questionnaire was not completed prior to this visit.  Cardiac Risk Factors include: advanced age (>55men, >59 women);hypertension;dyslipidemia;Other (see comment), Risk factor comments: prediabetic     Objective:    Today's Vitals   06/15/24 1357  Weight: 125 lb (56.7 kg)  Height: 5' 6 (1.676 m)   Body mass index is 20.18 kg/m.     06/15/2024    2:08 PM 06/03/2023    1:10 PM 05/23/2022    2:21 PM 05/07/2022   10:21 AM 11/22/2021    2:17 PM 08/30/2021   11:08 AM 05/06/2021   10:15 AM  Advanced Directives  Does Patient Have a Medical Advance Directive? No No No No No No No  Would patient like information on creating a medical advance directive? Yes (MAU/Ambulatory/Procedural Areas - Information given) No - Patient declined No -  Patient declined No - Patient declined No - Patient declined No - Patient declined No - Patient declined    Current Medications (verified) Outpatient Encounter Medications as of 06/15/2024  Medication Sig   ACCU-CHEK GUIDE TEST test strip TEST TWICE DAILY   Accu-Chek Softclix Lancets lancets TEST TWICE DAILY   alendronate  (FOSAMAX ) 70 MG tablet Take 1 tablet (70 mg total) by mouth once a week. Take with a full glass of water on an empty stomach.   aspirin 81 MG chewable tablet Chew by mouth daily.   atorvastatin  (LIPITOR) 10 MG tablet TAKE 1 TABLET BY MOUTH DAILY   blood glucose meter kit and supplies Dispense based on patient and insurance preference. Use up to four times daily as needed. (FOR ICD-10 E11.9).   Calcium  Carb-Cholecalciferol (CALCIUM  500 + D) 500-125 MG-UNIT TABS Take 2 tablets by mouth daily. Calcium  600   irbesartan  (AVAPRO ) 75 MG tablet Take 1 tablet (75 mg total) by mouth daily.   metoprolol  succinate (TOPROL  XL) 25 MG 24 hr tablet Take 1 tablet (25 mg total) by mouth daily.   Multiple Vitamin (MULTIVITAMIN WITH MINERALS) TABS tablet Take 1 tablet by mouth every other day.   No facility-administered encounter medications on file as of 06/15/2024.    Allergies (verified) Ace inhibitors, Hydrochlorothiazide, and Triamterene-hctz   History: Past Medical History:  Diagnosis Date   Anemia    Breast cancer (HCC) 2018   Left   Diabetes mellitus without complication (HCC)    diet controlled   Edema    ankles   GERD (gastroesophageal reflux disease)  Hypertension    Mood disorder (HCC) 08/21/2020   Unexplained weight loss 07/24/2015   EGD and Colonoscopy normal 2014    Wears dentures    Full lower, partial upper   Past Surgical History:  Procedure Laterality Date   ABDOMINAL HYSTERECTOMY  1980   BREAST BIOPSY Left 2018   DCIS   BREAST LUMPECTOMY Left 03/2017   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 WITH    BREAST LUMPECTOMY WITH NEEDLE  LOCALIZATION Left 04/07/2017   Procedure: BREAST LUMPECTOMY WITH NEEDLE LOCALIZATION;  Surgeon: Nicholaus Selinda Birmingham, MD;  Location: [facility];  Service: General;  Laterality: Left;   BREAST SURGERY Left    Breast Biopsy   CATARACT EXTRACTION W/PHACO Right 06/12/2016   Procedure: CATARACT EXTRACTION PHACO AND INTRAOCULAR LENS PLACEMENT (IOC);  Surgeon: Adine Oneil Novak, MD;  Location: [facility];  Service: Ophthalmology;  Laterality: Right;  US  6.1 AP% 12.7 CDE .77 FLUID PACK LOT # [id number]   CATARACT EXTRACTION W/PHACO Left 04/02/2021   Procedure: CATARACT EXTRACTION PHACO AND INTRAOCULAR LENS PLACEMENT (IOC) LEFT MILOOP DIABETIC;  Surgeon: Jaye Fallow, MD;  Location: [facility];  Service: Ophthalmology;  Laterality: Left;  Diabetic - diet controlled 52.81 03:47.0   COLONOSCOPY  08/02/2012   Dr. Luellen - diverticuli otherwise normal   ESOPHAGOGASTRODUODENOSCOPY  08/02/2012   chronic gastritis   EYE SURGERY Right    Cataract Extraction with IOL   OOPHORECTOMY     Family History  Problem Relation Age of Onset   Breast cancer Mother 74   Diabetes Mother    Hypertension Mother    CAD Father    Breast cancer Sister    Hypertension Sister    Breast cancer Maternal Aunt 50   Throat cancer Brother    Social History   Socioeconomic History   Marital status: Married    Spouse name: Charlie   Number of children: 0   Years of education: Not on file   Highest education level: 10th grade  Occupational History   Occupation: Retired  Tobacco Use   Smoking status: Never    Passive exposure: Past   Smokeless tobacco: Never   Tobacco comments:    smoking cessation materials not required  Vaping Use   Vaping status: Never Used  Substance and Sexual Activity   Alcohol use: No    Alcohol/week: 0.0 standard drinks of alcohol   Drug use: No   Sexual activity: Not Currently  Other Topics Concern   Not on file  Social History Narrative   Not on file   Social Drivers of  Health   Financial Resource Strain: Low Risk  (06/15/2024)   Overall Financial Resource Strain (CARDIA)    Difficulty of Paying Living Expenses: Not very hard  Food Insecurity: No Food Insecurity (06/15/2024)   Hunger Vital Sign    Worried About Running Out of Food in the Last Year: Never true    Ran Out of Food in the Last Year: Never true  Transportation Needs: No Transportation Needs (06/15/2024)   PRAPARE - Administrator, Civil Service (Medical): No    Lack of Transportation (Non-Medical): No  Physical Activity: Inactive (06/15/2024)   Exercise Vital Sign    Days of Exercise per Week: 0 days    Minutes of Exercise per Session: 0 min  Stress: No Stress Concern Present (06/15/2024)   [employer] of Occupational Health - Occupational Stress Questionnaire    Feeling of Stress: Not at all  Social Connections: Moderately Isolated (06/15/2024)   Social Connection and Isolation Panel    Frequency of Communication with Friends and Family: Twice a week    Frequency of Social Gatherings with Friends and Family: Three times a week    Attends Religious Services: Never    Active Member of Clubs or Organizations: No    Attends Banker Meetings: Never    Marital Status: Married    Tobacco Counseling Counseling given: Not Answered Tobacco comments: smoking cessation materials not required    Clinical Intake:  Pre-visit preparation completed: Yes  Pain : No/denies pain     BMI - recorded: 20.18 Nutritional Status: BMI of 19-24  Normal Nutritional Risks: None Diabetes: No  Lab Results  Component Value Date   HGBA1C 6.1 (H) 02/22/2024   HGBA1C 6.3 (H) 06/23/2023   HGBA1C 6.1 (H) 04/24/2022     How often do you need to have someone help you when you read instructions, pamphlets, or other written materials from your doctor or pharmacy?: 1 - Never  Interpreter Needed?: No  Information entered by :: Vina Ned, CMA   Activities of Daily Living      06/15/2024    1:59 PM  In your present state of health, do you have any difficulty performing the following activities:  Hearing? 0  Comment sometimes in left hear  Vision? 0  Difficulty concentrating or making decisions? 1  Comment sometimes  Walking or climbing stairs? 0  Dressing or bathing? 0  Doing errands, shopping? 0  Preparing Food and eating ? N  Using the Toilet? N  In the past six months, have you accidently leaked urine? N  Do you have problems with loss of bowel control? N  Managing your Medications? N  Managing your Finances? N  Housekeeping or managing your Housekeeping? N    Patient Care Team: Justus Leita DEL, MD as PCP - General (Internal Medicine) Darliss Rogue, MD as PCP - Cardiology (Cardiology) Myrna Adine Anes, MD as Consulting Physician (Ophthalmology)  I have updated your Care Teams any recent Medical Services you may have received from other providers in the past year.     Assessment:   This is a routine wellness examination for Ayumi.  Hearing/Vision screen Hearing Screening - Comments:: Some hearing loss in left ear  Vision Screening - Comments:: Gets routine eye exams, Dr. Adine Myrna, [place]   Goals Addressed             This Visit's Progress    Patient Stated       Get out more and be more social       Depression Screen     06/15/2024    2:05 PM 02/22/2024   10:49 AM 06/23/2023    2:26 PM 06/03/2023    1:08 PM 02/16/2023    2:11 PM 11/17/2022    2:23 PM 10/15/2022    2:16 PM  PHQ 2/9 Scores  PHQ - 2 Score 1 4 2  0 2 0 0  PHQ- 9 Score 1 5 3  0 2 0 2    Fall Risk     06/15/2024    2:10 PM 02/22/2024   10:49 AM 06/23/2023    2:26 PM 06/03/2023    1:11 PM 02/16/2023    2:11 PM  Fall Risk   Falls in the past year? 0 0 0 0 0  Number falls in past yr: 0 0 0 0 0  Injury with Fall? 0 0 0 0 0  Risk for fall due to : No Fall Risks No Fall Risks No Fall Risks No Fall Risks No Fall Risks  Follow up Falls evaluation completed  Falls evaluation completed Falls evaluation completed Falls prevention discussed;Falls evaluation completed Falls evaluation completed    MEDICARE RISK AT HOME:  Medicare Risk at Home Any stairs in or around the home?: Yes If so, are there any without handrails?: Yes Home free of loose throw rugs in walkways, pet beds, electrical cords, etc?: Yes Adequate lighting in your home to reduce risk of falls?: Yes Life alert?: Yes Use of a cane, walker or w/c?: No Grab bars in the bathroom?: No Shower chair or bench in shower?: No Elevated toilet seat or a handicapped toilet?: No  TIMED UP AND GO:  Was the test performed?  No  Cognitive Function: 6CIT completed        06/15/2024    2:11 PM 06/03/2023    1:12 PM 04/11/2020    2:51 PM 04/11/2019    1:41 PM 04/07/2018    1:53 PM  6CIT Screen  What Year? 0 points 0 points 0 points 0 points 0 points  What month? 0 points 0 points 0 points 0 points 0 points  What time? 0 points 0 points 0 points 0 points 0 points  Count back from 20 0 points 4 points 0 points 0 points 0 points  Months in reverse 0 points 2 points 0 points 0 points 0 points  Repeat phrase 4 points 0 points 4 points 6 points 4 points  Total Score 4 points 6 points 4 points 6 points 4 points    Immunizations Immunization History  Administered Date(s) Administered   Fluad Quad(high Dose 65+) 08/21/2020, 08/26/2022   Influenza Nasal 08/12/2019   Influenza Split 08/24/2017   Influenza, High Dose Seasonal PF 08/18/2017, 08/16/2018, 08/12/2019   Influenza-Unspecified 08/24/2017, 08/08/2021, 09/10/2023   Moderna Sars-Covid-2 Vaccination 02/20/2020, 03/21/2020, 09/10/2023   PFIZER Comirnaty(Gray Top)Covid-19 Tri-Sucrose Vaccine 10/08/2020   Pneumococcal Conjugate-13 12/27/2014   Pneumococcal Polysaccharide-23 11/11/2012, 05/05/2013, 05/05/2013   RSV,unspecified 08/26/2022   Tdap 01/20/2013, 05/11/2013    Screening Tests Health Maintenance  Topic Date Due   Zoster Vaccines-  Shingrix (1 of 2) Never done   DTaP/Tdap/Td (3 - Td or Tdap) 05/12/2023   DEXA SCAN  10/01/2023   COVID-19 Vaccine (5 - 2024-25 season) 11/05/2023   INFLUENZA VACCINE  06/10/2024   MAMMOGRAM  10/04/2024   Medicare Annual Wellness (AWV)  06/15/2025   Pneumococcal Vaccine: 50+ Years  Completed   Hepatitis C Screening  Completed   Hepatitis B Vaccines  Aged Out   HPV VACCINES  Aged Out   Meningococcal B Vaccine  Aged Out   Colonoscopy  Discontinued    Health Maintenance  Health Maintenance Due  Topic Date Due   Zoster Vaccines- Shingrix (1 of 2) Never done   DTaP/Tdap/Td (3 - Td or Tdap) 05/12/2023   DEXA SCAN  10/01/2023   COVID-19 Vaccine (5 - 2024-25 season) 11/05/2023   INFLUENZA VACCINE  06/10/2024   Health Maintenance Items Addressed: Mammogram ordered, DEXA ordered, See Nurse Notes at the end of this note  Additional Screening:  Vision Screening: Recommended annual ophthalmology exams for early detection of glaucoma and other disorders of the eye. Would you like a referral to an eye doctor? No    Dental Screening: Recommended annual dental exams for proper oral hygiene  Community Resource Referral / Chronic Care Management: CRR required this visit?  No   CCM  required this visit?  No   Plan:    I have personally reviewed and noted the following in the patient's chart:   Medical and social history Use of alcohol, tobacco or illicit drugs  Current medications and supplements including opioid prescriptions. Patient is not currently taking opioid prescriptions. Functional ability and status Nutritional status Physical activity Advanced directives List of other physicians Hospitalizations, surgeries, and ER visits in previous 12 months Vitals Screenings to include cognitive, depression, and falls Referrals and appointments  In addition, I have reviewed and discussed with patient certain preventive protocols, quality metrics, and best practice recommendations.  A written personalized care plan for preventive services as well as general preventive health recommendations were provided to patient.   Vina Ned, CMA   06/15/2024   After Visit Summary: (Mail) Due to this being a telephonic visit, the after visit summary with patients personalized plan was offered to patient via mail   Notes:  6 CIT Score - 4 Placed orders for [facility]G (due ~09/24/24) and DEXA scan Covid and flu vaccines in the fall Needs Tdap vaccine (pharmacy) Declined shingles vaccine Screening colonoscopy no longer recommended due to age

## 2024-06-24 ENCOUNTER — Ambulatory Visit (INDEPENDENT_AMBULATORY_CARE_PROVIDER_SITE_OTHER): Admitting: Internal Medicine

## 2024-06-24 ENCOUNTER — Encounter: Payer: Self-pay | Admitting: Internal Medicine

## 2024-06-24 VITALS — BP 102/74 | HR 68 | Ht 66.0 in | Wt 124.1 lb

## 2024-06-24 DIAGNOSIS — R7303 Prediabetes: Secondary | ICD-10-CM

## 2024-06-24 DIAGNOSIS — N1831 Chronic kidney disease, stage 3a: Secondary | ICD-10-CM | POA: Diagnosis not present

## 2024-06-24 DIAGNOSIS — I471 Supraventricular tachycardia, unspecified: Secondary | ICD-10-CM | POA: Diagnosis not present

## 2024-06-24 DIAGNOSIS — I10 Essential (primary) hypertension: Secondary | ICD-10-CM

## 2024-06-24 DIAGNOSIS — H579 Unspecified disorder of eye and adnexa: Secondary | ICD-10-CM

## 2024-06-24 NOTE — Progress Notes (Signed)
 Date:  06/24/2024   Name:  Brandy Diaz   DOB:  14-Oct-1946   MRN:  [id number]   Chief Complaint: Diabetes and Hypertension Feeling well, has gained a pound since last visit.  Not worrying as much about her nightly ice cream w/r pre diabetes.  Hypertension This is a recurrent problem. The problem is controlled. Pertinent negatives include no chest pain, headaches, palpitations or shortness of breath. Past treatments include beta blockers and angiotensin blockers. The current treatment provides significant improvement. Hypertensive end-organ damage includes kidney disease.  Diabetes She presents for her follow-up diabetic visit. She has type 2 diabetes mellitus. Her disease course has been stable. Pertinent negatives for hypoglycemia include no dizziness, headaches or nervousness/anxiousness. Pertinent negatives for diabetes include no chest pain, no fatigue and no weakness. Current diabetic treatment includes diet.  Heart Problem This is a chronic problem. The problem has been resolved. Pertinent negatives include no abdominal pain, arthralgias, chest pain, chills, coughing, fatigue, headaches, myalgias or weakness. Nothing aggravates the symptoms. Treatments tried: on beta blocker with good symptom control.    Review of Systems  Constitutional:  Negative for chills, fatigue and unexpected weight change.  HENT:  Negative for trouble swallowing.   Eyes:  Negative for visual disturbance.  Respiratory:  Negative for cough, chest tightness, shortness of breath and wheezing.   Cardiovascular:  Negative for chest pain, palpitations and leg swelling.  Gastrointestinal:  Negative for abdominal pain, constipation and diarrhea.  Musculoskeletal:  Negative for arthralgias and myalgias.  Allergic/Immunologic: Positive for environmental allergies (gets red itchy eyes after cutting the grass).  Neurological:  Negative for dizziness, weakness, light-headedness and headaches.  Psychiatric/Behavioral:   Negative for dysphoric mood and sleep disturbance. The patient is not nervous/anxious.      Lab Results  Component Value Date   NA 140 02/22/2024   K 4.5 02/22/2024   CO2 24 02/22/2024   GLUCOSE 114 (H) 02/22/2024   BUN 14 02/22/2024   CREATININE 1.27 (H) 02/22/2024   CALCIUM  10.1 02/22/2024   EGFR 43 (L) 02/22/2024   GFRNONAA 46 (L) 08/17/2020   Lab Results  Component Value Date   CHOL 170 02/22/2024   HDL 83 02/22/2024   LDLCALC 74 02/22/2024   TRIG 65 02/22/2024   CHOLHDL 2.0 02/22/2024   Lab Results  Component Value Date   TSH 1.320 02/22/2024   Lab Results  Component Value Date   HGBA1C 6.1 (H) 02/22/2024   Lab Results  Component Value Date   WBC 4.8 02/22/2024   HGB 12.6 02/22/2024   HCT 38.8 02/22/2024   MCV 92 02/22/2024   PLT 275 02/22/2024   Lab Results  Component Value Date   ALT 19 02/22/2024   AST 28 02/22/2024   ALKPHOS 80 02/22/2024   BILITOT 0.5 02/22/2024   Lab Results  Component Value Date   VD25OH 40.97 05/23/2022     Patient Active Problem List   Diagnosis Date Noted   PSVT (paroxysmal supraventricular tachycardia) (HCC) 06/24/2024   Tachycardia 10/15/2022   Hemorrhagic cystitis 06/06/2022   Osteoporosis 04/24/2022   Chronic idiopathic constipation 10/22/2021   Venous stasis dermatitis of both lower extremities 11/08/2019   Ductal carcinoma in situ (DCIS) of left breast 05/21/2017   Localized edema 04/28/2017   Stage 3a chronic kidney disease (HCC) 01/01/2016   Essential (primary) hypertension 07/24/2015   Gastro-esophageal reflux disease without esophagitis 07/24/2015   Mixed hyperlipidemia 07/24/2015   Prediabetes 07/24/2015   Fibrocystic breast changes 07/24/2015  Anxiety 05/24/2012    Allergies  Allergen Reactions   Ace Inhibitors Other (See Comments)    Cough   Hydrochlorothiazide Other (See Comments)    Dizziness   Triamterene-Hctz Other (See Comments)    Appetite loss    Past Surgical History:  Procedure  Laterality Date   ABDOMINAL HYSTERECTOMY  1980   BREAST BIOPSY Left 2018   DCIS   BREAST LUMPECTOMY Left 03/2017   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 WITH    BREAST LUMPECTOMY WITH NEEDLE LOCALIZATION Left 04/07/2017   Procedure: BREAST LUMPECTOMY WITH NEEDLE LOCALIZATION;  Surgeon: Nicholaus Selinda Birmingham, MD;  Location: [facility];  Service: General;  Laterality: Left;   BREAST SURGERY Left    Breast Biopsy   CATARACT EXTRACTION W/PHACO Right 06/12/2016   Procedure: CATARACT EXTRACTION PHACO AND INTRAOCULAR LENS PLACEMENT (IOC);  Surgeon: Adine Oneil Novak, MD;  Location: [facility];  Service: Ophthalmology;  Laterality: Right;  US  6.1 AP% 12.7 CDE .77 FLUID PACK LOT # [id number]   CATARACT EXTRACTION W/PHACO Left 04/02/2021   Procedure: CATARACT EXTRACTION PHACO AND INTRAOCULAR LENS PLACEMENT (IOC) LEFT MILOOP DIABETIC;  Surgeon: Jaye Fallow, MD;  Location: [facility];  Service: Ophthalmology;  Laterality: Left;  Diabetic - diet controlled 52.81 03:47.0   COLONOSCOPY  08/02/2012   Dr. Luellen - diverticuli otherwise normal   ESOPHAGOGASTRODUODENOSCOPY  08/02/2012   chronic gastritis   EYE SURGERY Right    Cataract Extraction with IOL   OOPHORECTOMY      Social History   Tobacco Use   Smoking status: Never    Passive exposure: Past   Smokeless tobacco: Never   Tobacco comments:    smoking cessation materials not required  Vaping Use   Vaping status: Never Used  Substance Use Topics   Alcohol use: No    Alcohol/week: 0.0 standard drinks of alcohol   Drug use: No     Medication list has been reviewed and updated.  Current Meds  Medication Sig   ACCU-CHEK GUIDE TEST test strip TEST TWICE DAILY   Accu-Chek Softclix Lancets lancets TEST TWICE DAILY   alendronate  (FOSAMAX ) 70 MG tablet Take 1 tablet (70 mg total) by mouth once a week. Take with a full glass of water on an empty stomach.   aspirin 81 MG chewable tablet Chew by mouth daily.    atorvastatin  (LIPITOR) 10 MG tablet TAKE 1 TABLET BY MOUTH DAILY   blood glucose meter kit and supplies Dispense based on patient and insurance preference. Use up to four times daily as needed. (FOR ICD-10 E11.9).   Calcium  Carb-Cholecalciferol (CALCIUM  500 + D) 500-125 MG-UNIT TABS Take 2 tablets by mouth daily. Calcium  600   irbesartan  (AVAPRO ) 75 MG tablet Take 1 tablet (75 mg total) by mouth daily.   metoprolol  succinate (TOPROL  XL) 25 MG 24 hr tablet Take 1 tablet (25 mg total) by mouth daily.   Multiple Vitamin (MULTIVITAMIN WITH MINERALS) TABS tablet Take 1 tablet by mouth every other day.       06/24/2024    9:54 AM 02/22/2024   10:52 AM 06/23/2023    2:27 PM 02/16/2023    2:12 PM  GAD 7 : Generalized Anxiety Score  Nervous, Anxious, on Edge 0 0 0 0  Control/stop worrying 0 0 0 0  Worry too much - different things 0 0 0 0  Trouble relaxing 0 0 0 0  Restless 0 0 0 0  Easily annoyed or irritable 0 0 0  0  Afraid - awful might happen 0 0 0 0  Total GAD 7 Score 0 0 0 0  Anxiety Difficulty Not difficult at all Not difficult at all Not difficult at all Not difficult at all       06/24/2024    9:54 AM 06/15/2024    2:05 PM 02/22/2024   10:49 AM  Depression screen PHQ 2/9  Decreased Interest 0 0 3  Down, Depressed, Hopeless 1 1 1   PHQ - 2 Score 1 1 4   Altered sleeping 0 0 0  Tired, decreased energy 0 0 1  Change in appetite 0 0 0  Feeling bad or failure about yourself  0 0 0  Trouble concentrating 0 0 0  Moving slowly or fidgety/restless 0 0 0  Suicidal thoughts 0 0   PHQ-9 Score 1 1 5   Difficult doing work/chores Not difficult at all Not difficult at all Not difficult at all    BP Readings from Last 3 Encounters:  06/24/24 102/74  02/22/24 122/74  08/06/23 134/80    Physical Exam Vitals and nursing note reviewed.  Constitutional:      General: She is not in acute distress.    Appearance: Normal appearance. She is well-developed.  HENT:     Head: Normocephalic and  atraumatic.  Eyes:     General: Lids are normal.     Extraocular Movements: Extraocular movements intact.     Conjunctiva/sclera: Conjunctivae normal.  Cardiovascular:     Rate and Rhythm: Normal rate and regular rhythm.     Pulses: Normal pulses.     Heart sounds: No murmur heard. Pulmonary:     Effort: Pulmonary effort is normal. No respiratory distress.     Breath sounds: No wheezing or rhonchi.  Musculoskeletal:     Cervical back: Normal range of motion.     Right lower leg: No edema.     Left lower leg: No edema.  Lymphadenopathy:     Cervical: No cervical adenopathy.  Skin:    General: Skin is warm and dry.     Findings: No rash.  Neurological:     General: No focal deficit present.     Mental Status: She is alert and oriented to person, place, and time.  Psychiatric:        Mood and Affect: Mood normal.        Behavior: Behavior normal.     Wt Readings from Last 3 Encounters:  06/24/24 124 lb 2 oz (56.3 kg)  06/15/24 125 lb (56.7 kg)  02/22/24 123 lb 8 oz (56 kg)    BP 102/74   Pulse 68   Ht 5' 6 (1.676 m)   Wt 124 lb 2 oz (56.3 kg)   SpO2 98%   BMI 20.03 kg/m   Assessment and Plan:  Problem List Items Addressed This Visit       Unprioritized   Essential (primary) hypertension - Primary (Chronic)   Blood pressure is well controlled on metoprolol  and avapro . No medication side effects noted. Plan to continue current medications.       Prediabetes (Chronic)   Stable, liberalize diet. Aim for a few more pounds of weight gain.      Stage 3a chronic kidney disease (HCC) (Chronic)   GFR has been stable over the past 5 years. Will continue to check this regularly - every 4-6 mo      PSVT (paroxysmal supraventricular tachycardia) (HCC)   She has been seen by Cardiology - last  visit 07/2023 On metoprolol  without any recurrence. 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      Other Visit Diagnoses       Allergic eye reaction       likely due to grass/dust/pollen wash face and eyes after yard work follow up if symptoms are persistent      She is given the number to schedule her DEXA and Mammogram.  Return in about 4 months (around 10/24/2024) for HTN, DM.    Leita HILARIO Adie, MD [facility] [place]

## 2024-06-24 NOTE — Assessment & Plan Note (Signed)
 GFR has been stable over the past 5 years. Will continue to check this regularly - every 4-6 mo

## 2024-06-24 NOTE — Assessment & Plan Note (Signed)
 Blood pressure is well controlled on metoprolol  and avapro . No medication side effects noted. Plan to continue current medications.

## 2024-06-24 NOTE — Assessment & Plan Note (Signed)
 She has been seen by Cardiology - last visit 07/2023 On metoprolol  without any recurrence. 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

## 2024-06-24 NOTE — Patient Instructions (Signed)
 Call [facility] to schedule your mammogram and Bone Density at [phone].

## 2024-06-24 NOTE — Assessment & Plan Note (Signed)
 Stable, liberalize diet. Aim for a few more pounds of weight gain.

## 2024-08-22 ENCOUNTER — Other Ambulatory Visit: Payer: Self-pay | Admitting: Internal Medicine

## 2024-08-22 DIAGNOSIS — I10 Essential (primary) hypertension: Secondary | ICD-10-CM

## 2024-08-22 DIAGNOSIS — R7303 Prediabetes: Secondary | ICD-10-CM

## 2024-08-25 NOTE — Telephone Encounter (Signed)
 Requested Prescriptions  Pending Prescriptions Disp Refills   glucose blood (ACCU-CHEK GUIDE TEST) test strip [Pharmacy Med Name: Accu-Chek Guide In Vitro Strip] 200 strip 0    Sig: TEST TWICE DAILY     There is no refill protocol information for this order     irbesartan  (AVAPRO ) 75 MG tablet [Pharmacy Med Name: IRBESARTAN   75MG   TAB] 100 tablet 0    Sig: TAKE 1 TABLET BY MOUTH DAILY     Cardiovascular:  Angiotensin Receptor Blockers Failed - 08/25/2024 10:59 AM      Failed - Cr in normal range and within 180 days    Creatinine  Date Value Ref Range Status  02/22/2012 1.11 0.60 - 1.30 mg/dL Final   Creatinine, Ser  Date Value Ref Range Status  02/22/2024 1.27 (H) 0.57 - 1.00 mg/dL Final         Failed - K in normal range and within 180 days    Potassium  Date Value Ref Range Status  02/22/2024 4.5 3.5 - 5.2 mmol/L Final  02/22/2012 4.0 3.5 - 5.1 mmol/L Final         Passed - Patient is not pregnant      Passed - Last BP in normal range    BP Readings from Last 1 Encounters:  06/24/24 102/74         Passed - Valid encounter within last 6 months    Recent Outpatient Visits           2 months ago Essential (primary) hypertension   [place] [facility] at [facility], Leita DEL, MD   6 months ago Essential (primary) hypertension   [facility] at [facility], Leita DEL, MD

## 2024-10-05 ENCOUNTER — Ambulatory Visit: Payer: Self-pay | Admitting: Internal Medicine

## 2024-10-05 ENCOUNTER — Ambulatory Visit
Admission: RE | Admit: 2024-10-05 | Discharge: 2024-10-05 | Disposition: A | Discharge status: Home / Self Care | Source: Ambulatory Visit | Attending: Student | Admitting: Student

## 2024-10-05 DIAGNOSIS — Z780 Asymptomatic menopausal state: Secondary | ICD-10-CM | POA: Insufficient documentation

## 2024-10-05 DIAGNOSIS — Z1231 Encounter for screening mammogram for malignant neoplasm of breast: Secondary | ICD-10-CM | POA: Insufficient documentation

## 2024-10-05 DIAGNOSIS — M810 Age-related osteoporosis without current pathological fracture: Secondary | ICD-10-CM

## 2024-10-05 NOTE — Assessment & Plan Note (Signed)
 On alendronate  DEXA 2025 - osteopenia

## 2024-10-10 ENCOUNTER — Other Ambulatory Visit: Payer: Self-pay | Admitting: Internal Medicine

## 2024-10-10 DIAGNOSIS — E1122 Type 2 diabetes mellitus with diabetic chronic kidney disease: Secondary | ICD-10-CM

## 2024-10-13 NOTE — Telephone Encounter (Signed)
 Requested Prescriptions  Pending Prescriptions Disp Refills   Accu-Chek Softclix Lancets lancets [Pharmacy Med Name: Accu-Chek Softclix Lancets] 200 each 2    Sig: TEST TWICE DAILY     Endocrinology: Diabetes - Testing Supplies Passed - 10/13/2024 10:08 AM      Passed - Valid encounter within last 12 months    Recent Outpatient Visits           3 months ago Essential (primary) hypertension   [place] [facility] at [facility], Leita DEL, MD   7 months ago Essential (primary) hypertension   [place] [facility] at [facility], Leita DEL, MD       Future Appointments             Tomorrow Darliss Rogue, MD [facility]

## 2024-10-14 ENCOUNTER — Ambulatory Visit: Attending: Cardiology | Admitting: Cardiology

## 2024-10-28 ENCOUNTER — Ambulatory Visit: Admitting: Internal Medicine

## 2024-10-28 ENCOUNTER — Encounter: Payer: Self-pay | Admitting: Internal Medicine

## 2024-10-28 VITALS — BP 122/64 | HR 93 | Ht 66.0 in | Wt 118.0 lb

## 2024-10-28 DIAGNOSIS — R634 Abnormal weight loss: Secondary | ICD-10-CM | POA: Diagnosis not present

## 2024-10-28 DIAGNOSIS — N1832 Chronic kidney disease, stage 3b: Secondary | ICD-10-CM | POA: Diagnosis not present

## 2024-10-28 DIAGNOSIS — R7303 Prediabetes: Secondary | ICD-10-CM

## 2024-10-28 DIAGNOSIS — M791 Myalgia, unspecified site: Secondary | ICD-10-CM

## 2024-10-28 DIAGNOSIS — I10 Essential (primary) hypertension: Secondary | ICD-10-CM

## 2024-10-28 NOTE — Progress Notes (Signed)
 "   Date:  10/28/2024   Name:  Brandy Diaz   DOB:  25-Aug-1946   MRN:  [id number]   Chief Complaint: Hypertension, Chronic Kidney Disease, and Muscle Pain (Cramps in upper right arm ~40mo; has been taking care of husband and may have strained muscle)  Hypertension This is a chronic problem. The problem is controlled. Pertinent negatives include no chest pain, headaches, palpitations or shortness of breath. Past treatments include angiotensin blockers and beta blockers. The current treatment provides significant improvement.  Muscle Pain This is a new problem. The current episode started 1 to 4 weeks ago. The problem occurs daily. The problem is unchanged. The pain is present in the upper right arm. The pain is mild. The symptoms are aggravated by any movement (and reaching overhead). Pertinent negatives include no abdominal pain, chest pain, dysuria, fatigue, headaches, joint swelling, stiffness, shortness of breath or weakness.  CKD - stable stage 3b for several years.  Recent weight loss - likely due to restricting intake and caring for husband post stroke.  She has not been seen by Nephrology due to stable values and no referable symptoms.  Review of Systems  Constitutional:  Positive for unexpected weight change. Negative for appetite change and fatigue.  HENT:  Negative for trouble swallowing.   Respiratory:  Negative for chest tightness and shortness of breath.   Cardiovascular:  Positive for leg swelling. Negative for chest pain and palpitations.  Gastrointestinal:  Negative for abdominal pain.  Genitourinary:  Negative for dysuria.  Musculoskeletal:  Negative for joint swelling and stiffness.  Neurological:  Negative for dizziness, weakness and headaches.  Psychiatric/Behavioral:  Negative for dysphoric mood and sleep disturbance. The patient is not nervous/anxious.      Lab Results  Component Value Date   NA 140 02/22/2024   K 4.5 02/22/2024   CO2 24 02/22/2024   GLUCOSE 114  (H) 02/22/2024   BUN 14 02/22/2024   CREATININE 1.27 (H) 02/22/2024   CALCIUM  10.1 02/22/2024   EGFR 43 (L) 02/22/2024   GFRNONAA 46 (L) 08/17/2020   Lab Results  Component Value Date   CHOL 170 02/22/2024   HDL 83 02/22/2024   LDLCALC 74 02/22/2024   TRIG 65 02/22/2024   CHOLHDL 2.0 02/22/2024   Lab Results  Component Value Date   TSH 1.320 02/22/2024   Lab Results  Component Value Date   HGBA1C 6.1 (H) 02/22/2024   Lab Results  Component Value Date   WBC 4.8 02/22/2024   HGB 12.6 02/22/2024   HCT 38.8 02/22/2024   MCV 92 02/22/2024   PLT 275 02/22/2024   Lab Results  Component Value Date   ALT 19 02/22/2024   AST 28 02/22/2024   ALKPHOS 80 02/22/2024   BILITOT 0.5 02/22/2024   Lab Results  Component Value Date   VD25OH 40.97 05/23/2022     Patient Active Problem List   Diagnosis Date Noted   PSVT (paroxysmal supraventricular tachycardia) 06/24/2024   Tachycardia 10/15/2022   Hemorrhagic cystitis 06/06/2022   Osteoporosis 04/24/2022   Chronic idiopathic constipation 10/22/2021   Venous stasis dermatitis of both lower extremities 11/08/2019   Ductal carcinoma in situ (DCIS) of left breast 05/21/2017   Localized edema 04/28/2017   Stage 3b chronic kidney disease (HCC) 01/01/2016   Essential (primary) hypertension 07/24/2015   Gastro-esophageal reflux disease without esophagitis 07/24/2015   Mixed hyperlipidemia 07/24/2015   Prediabetes 07/24/2015   Weight loss, unintentional 07/24/2015   Fibrocystic breast changes 07/24/2015   Anxiety  05/24/2012    Allergies[1]  Past Surgical History:  Procedure Laterality Date   ABDOMINAL HYSTERECTOMY  1980   BREAST BIOPSY Left 2018   DCIS   BREAST LUMPECTOMY Left 03/2017   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 WITH    BREAST LUMPECTOMY WITH NEEDLE LOCALIZATION Left 04/07/2017   Procedure: BREAST LUMPECTOMY WITH NEEDLE LOCALIZATION;  Surgeon: Nicholaus Selinda Birmingham, MD;  Location: [facility];   Service: General;  Laterality: Left;   BREAST SURGERY Left    Breast Biopsy   CATARACT EXTRACTION W/PHACO Right 06/12/2016   Procedure: CATARACT EXTRACTION PHACO AND INTRAOCULAR LENS PLACEMENT (IOC);  Surgeon: Adine Oneil Novak, MD;  Location: [facility];  Service: Ophthalmology;  Laterality: Right;  US  6.1 AP% 12.7 CDE .77 FLUID PACK LOT # [id number]   CATARACT EXTRACTION W/PHACO Left 04/02/2021   Procedure: CATARACT EXTRACTION PHACO AND INTRAOCULAR LENS PLACEMENT (IOC) LEFT MILOOP DIABETIC;  Surgeon: Jaye Fallow, MD;  Location: [facility];  Service: Ophthalmology;  Laterality: Left;  Diabetic - diet controlled 52.81 03:47.0   COLONOSCOPY  08/02/2012   Dr. Luellen - diverticuli otherwise normal   ESOPHAGOGASTRODUODENOSCOPY  08/02/2012   chronic gastritis   EYE SURGERY Right    Cataract Extraction with IOL   OOPHORECTOMY      Social History[2]   Medication list has been reviewed and updated.  Active Medications[3]     06/24/2024    9:54 AM 02/22/2024   10:52 AM 06/23/2023    2:27 PM 02/16/2023    2:12 PM  GAD 7 : Generalized Anxiety Score  Nervous, Anxious, on Edge 0 0 0 0  Control/stop worrying 0 0 0 0  Worry too much - different things 0 0 0 0  Trouble relaxing 0 0 0 0  Restless 0 0 0 0  Easily annoyed or irritable 0 0 0 0  Afraid - awful might happen 0 0 0 0  Total GAD 7 Score 0 0 0 0  Anxiety Difficulty Not difficult at all Not difficult at all Not difficult at all Not difficult at all       06/24/2024    9:54 AM 06/15/2024    2:05 PM 02/22/2024   10:49 AM  Depression screen PHQ 2/9  Decreased Interest 0 0 3  Down, Depressed, Hopeless 1 1 1   PHQ - 2 Score 1 1 4   Altered sleeping 0 0 0  Tired, decreased energy 0 0 1  Change in appetite 0 0 0  Feeling bad or failure about yourself  0 0 0  Trouble concentrating 0 0 0  Moving slowly or fidgety/restless 0 0 0  Suicidal thoughts 0 0   PHQ-9 Score 1  1  5    Difficult doing work/chores Not difficult at all  Not difficult at all Not difficult at all     Data saved with a previous flowsheet row definition    BP Readings from Last 3 Encounters:  10/28/24 122/64  06/24/24 102/74  02/22/24 122/74    Physical Exam Constitutional:      Appearance: Normal appearance.  Cardiovascular:     Rate and Rhythm: Normal rate and regular rhythm.  Pulmonary:     Effort: Pulmonary effort is normal.     Breath sounds: No wheezing or rhonchi.  Musculoskeletal:     Right shoulder: No swelling or deformity.     Left shoulder: No swelling or deformity.     Right upper arm: No edema, tenderness or bony tenderness.  Left upper arm: No edema, tenderness or bony tenderness.     Cervical back: Normal range of motion.     Right lower leg: Edema present.     Left lower leg: Edema present.     Comments: No abnormality on right upper arm exam  Lymphadenopathy:     Cervical: No cervical adenopathy.  Neurological:     General: No focal deficit present.     Mental Status: She is alert.     Sensory: Sensation is intact.     Motor: Motor function is intact.  Psychiatric:        Mood and Affect: Mood normal.        Behavior: Behavior normal.     Wt Readings from Last 3 Encounters:  10/28/24 118 lb (53.5 kg)  06/24/24 124 lb 2 oz (56.3 kg)  06/15/24 125 lb (56.7 kg)    BP 122/64   Pulse 93   Ht 5' 6 (1.676 m)   Wt 118 lb (53.5 kg)   SpO2 99%   BMI 19.05 kg/m   Assessment and Plan:  Problem List Items Addressed This Visit       Unprioritized   Essential (primary) hypertension - Primary (Chronic)   Well controlled blood pressure today. Current regimen is irbesartan  and metoprolol . She tolerated medications well.  She does have CKD that is being monitored.        Relevant Orders   CBC with Differential/Platelet   Prediabetes (Chronic)   She has not been concerned about her glucoses any more. However, she does have unintentional weight loss that is likely due to caring for her husband  and continuing to limit her intake. Will check A1C and CMP      Relevant Orders   Hemoglobin A1c   Weight loss, unintentional   She has a good appetite and eats regularly but does not eat until she feels satisfied or full. Recommend that she increase her portion sizes and can continues to supplement with Ensure. She should not worry about glucoses unless labs are abnormal.      Stage 3b chronic kidney disease (HCC)   Will get renal labs today. Continue to monitor weight loss - consider having her return for short term follow up depending on results      Relevant Orders   Comprehensive metabolic panel with GFR   Phosphorus   Parathyroid hormone, intact (no Ca)   VITAMIN D  25 Hydroxy (Vit-D Deficiency, Fractures)   Other Visit Diagnoses       Muscle pain       in right upper arm - suspect mild strain with normal exam recommend limited activities for several weeks if able       Return in about 4 months (around 02/26/2025) for HTN, CKD.    Leita HILARIO Adie, MD [place] [facility] [place]           [1]  Allergies Allergen Reactions   Ace Inhibitors Other (See Comments)    Cough   Hydrochlorothiazide Other (See Comments)    Dizziness   Triamterene-Hctz Other (See Comments)    Appetite loss  [2]  Social History Tobacco Use   Smoking status: Never    Passive exposure: Past   Smokeless tobacco: Never   Tobacco comments:    smoking cessation materials not required  Vaping Use   Vaping status: Never Used  Substance Use Topics   Alcohol use: No    Alcohol/week: 0.0 standard drinks of alcohol  Drug use: No  [3]  Current Meds  Medication Sig   Accu-Chek Softclix Lancets lancets TEST TWICE DAILY   alendronate  (FOSAMAX ) 70 MG tablet Take 1 tablet (70 mg total) by mouth once a week. Take with a full glass of water on an empty stomach.   aspirin 81 MG chewable tablet Chew by mouth daily.   atorvastatin  (LIPITOR) 10 MG tablet TAKE 1  TABLET BY MOUTH DAILY   blood glucose meter kit and supplies Dispense based on patient and insurance preference. Use up to four times daily as needed. (FOR ICD-10 E11.9).   Calcium  Carb-Cholecalciferol (CALCIUM  500 + D) 500-125 MG-UNIT TABS Take 2 tablets by mouth daily. Calcium  600   glucose blood (ACCU-CHEK GUIDE TEST) test strip TEST TWICE DAILY   irbesartan  (AVAPRO ) 75 MG tablet TAKE 1 TABLET BY MOUTH DAILY   metoprolol  succinate (TOPROL  XL) 25 MG 24 hr tablet Take 1 tablet (25 mg total) by mouth daily.   Multiple Vitamin (MULTIVITAMIN WITH MINERALS) TABS tablet Take 1 tablet by mouth every other day.   "

## 2024-10-28 NOTE — Assessment & Plan Note (Addendum)
 Well controlled blood pressure today. Current regimen is irbesartan  and metoprolol . She tolerated medications well.  She does have CKD that is being monitored.

## 2024-10-28 NOTE — Assessment & Plan Note (Signed)
 Will get renal labs today. Continue to monitor weight loss - consider having her return for short term follow up depending on results

## 2024-10-28 NOTE — Assessment & Plan Note (Signed)
 She has a good appetite and eats regularly but does not eat until she feels satisfied or full. Recommend that she increase her portion sizes and can continues to supplement with Ensure. She should not worry about glucoses unless labs are abnormal.

## 2024-10-28 NOTE — Assessment & Plan Note (Addendum)
 She has not been concerned about her glucoses any more. However, she does have unintentional weight loss that is likely due to caring for her husband and continuing to limit her intake. Will check A1C and CMP

## 2024-10-29 LAB — CBC WITH DIFFERENTIAL/PLATELET
Basophils Absolute: 0.1 x10E3/uL (ref 0.0–0.2)
Basos: 1 %
EOS (ABSOLUTE): 0 x10E3/uL (ref 0.0–0.4)
Eos: 1 %
Hematocrit: 40 % (ref 34.0–46.6)
Hemoglobin: 12.7 g/dL (ref 11.1–15.9)
Immature Grans (Abs): 0 x10E3/uL (ref 0.0–0.1)
Immature Granulocytes: 0 %
Lymphocytes Absolute: 1.1 x10E3/uL (ref 0.7–3.1)
Lymphs: 20 %
MCH: 29.7 pg (ref 26.6–33.0)
MCHC: 31.8 g/dL (ref 31.5–35.7)
MCV: 94 fL (ref 79–97)
Monocytes Absolute: 0.4 x10E3/uL (ref 0.1–0.9)
Monocytes: 8 %
Neutrophils Absolute: 3.8 x10E3/uL (ref 1.4–7.0)
Neutrophils: 70 %
Platelets: 307 x10E3/uL (ref 150–450)
RBC: 4.28 x10E6/uL (ref 3.77–5.28)
RDW: 12.9 % (ref 11.7–15.4)
WBC: 5.5 x10E3/uL (ref 3.4–10.8)

## 2024-10-29 LAB — COMPREHENSIVE METABOLIC PANEL WITH GFR
ALT: 19 IU/L (ref 0–32)
AST: 25 IU/L (ref 0–40)
Albumin: 4.3 g/dL (ref 3.8–4.8)
Alkaline Phosphatase: 82 IU/L (ref 49–135)
BUN/Creatinine Ratio: 13 (ref 12–28)
BUN: 19 mg/dL (ref 8–27)
Bilirubin Total: 0.4 mg/dL (ref 0.0–1.2)
CO2: 23 mmol/L (ref 20–29)
Calcium: 9.9 mg/dL (ref 8.7–10.3)
Chloride: 101 mmol/L (ref 96–106)
Creatinine, Ser: 1.43 mg/dL — ABNORMAL HIGH (ref 0.57–1.00)
Globulin, Total: 3.4 g/dL (ref 1.5–4.5)
Glucose: 93 mg/dL (ref 70–99)
Potassium: 3.8 mmol/L (ref 3.5–5.2)
Sodium: 140 mmol/L (ref 134–144)
Total Protein: 7.7 g/dL (ref 6.0–8.5)
eGFR: 37 mL/min/1.73 — ABNORMAL LOW

## 2024-10-29 LAB — HEMOGLOBIN A1C
Est. average glucose Bld gHb Est-mCnc: 134 mg/dL
Hgb A1c MFr Bld: 6.3 % — ABNORMAL HIGH (ref 4.8–5.6)

## 2024-10-29 LAB — VITAMIN D 25 HYDROXY (VIT D DEFICIENCY, FRACTURES): Vit D, 25-Hydroxy: 61.3 ng/mL (ref 30.0–100.0)

## 2024-10-29 LAB — PARATHYROID HORMONE, INTACT (NO CA): PTH: 33 pg/mL (ref 15–65)

## 2024-10-29 LAB — PHOSPHORUS: Phosphorus: 3.4 mg/dL (ref 3.0–4.3)

## 2024-10-31 ENCOUNTER — Ambulatory Visit: Payer: Self-pay | Admitting: Internal Medicine

## 2024-11-19 ENCOUNTER — Other Ambulatory Visit: Payer: Self-pay | Admitting: Oncology

## 2024-11-21 ENCOUNTER — Other Ambulatory Visit: Payer: Self-pay

## 2024-11-21 DIAGNOSIS — I10 Essential (primary) hypertension: Secondary | ICD-10-CM

## 2024-11-21 DIAGNOSIS — R7303 Prediabetes: Secondary | ICD-10-CM

## 2024-11-21 MED ORDER — IRBESARTAN 75 MG PO TABS: 75.0000 mg | ORAL_TABLET | Freq: Every day | ORAL | 0 refills | Status: AC

## 2024-11-21 MED ORDER — ACCU-CHEK GUIDE TEST VI STRP: 1.0000 | ORAL_STRIP | Freq: Two times a day (BID) | 0 refills | Status: AC

## 2024-11-21 NOTE — Telephone Encounter (Addendum)
 Per Dr. Melanee She does not follow-up with me anymore and therefore she should reach out to her primary care doctor for refills of Fosamax  if her primary care doctor wants her to continue.  Looks like patient has an appointment coming up on 4/21 with Dr. Lemon.

## 2024-11-21 NOTE — Telephone Encounter (Signed)
 She does not follow-up with me anymore and therefore she should reach out to her primary care doctor for refills of Fosamax  if her primary care doctor wants her to continue

## 2024-11-22 NOTE — Telephone Encounter (Signed)
 Outbound call to patient; informed of below.  Patient verbalized understanding.

## 2024-12-07 ENCOUNTER — Other Ambulatory Visit: Payer: Self-pay | Admitting: Oncology

## 2024-12-08 NOTE — Telephone Encounter (Signed)
 This will need to be discussed and refilled by her pcp

## 2024-12-08 NOTE — Telephone Encounter (Signed)
 Voicemail left for patient indicating if she needs a refill for fosamax  to follow up with her pcp.

## 2025-02-28 ENCOUNTER — Ambulatory Visit: Admitting: Student

## 2025-06-21 ENCOUNTER — Ambulatory Visit
